# Patient Record
Sex: Female | Born: 1945
Health system: Southern US, Community
[De-identification: ages and names within clinical notes are randomized; demographics above are authoritative.]

## PROBLEM LIST (undated history)

## (undated) DIAGNOSIS — R29898 Other symptoms and signs involving the musculoskeletal system: Secondary | ICD-10-CM

## (undated) DIAGNOSIS — D6481 Anemia due to antineoplastic chemotherapy: Principal | ICD-10-CM

## (undated) DIAGNOSIS — F419 Anxiety disorder, unspecified: Secondary | ICD-10-CM

## (undated) DIAGNOSIS — Z923 Personal history of irradiation: Secondary | ICD-10-CM

## (undated) DIAGNOSIS — R350 Frequency of micturition: Secondary | ICD-10-CM

## (undated) DIAGNOSIS — T451X5A Adverse effect of antineoplastic and immunosuppressive drugs, initial encounter: Principal | ICD-10-CM

## (undated) DIAGNOSIS — L719 Rosacea, unspecified: Secondary | ICD-10-CM

## (undated) DIAGNOSIS — C50919 Malignant neoplasm of unspecified site of unspecified female breast: Secondary | ICD-10-CM

## (undated) DIAGNOSIS — Z9221 Personal history of antineoplastic chemotherapy: Secondary | ICD-10-CM

## (undated) HISTORY — DX: Frequency of micturition: R35.0

## (undated) HISTORY — DX: Personal history of antineoplastic chemotherapy: Z92.21

## (undated) HISTORY — DX: Adverse effect of antineoplastic and immunosuppressive drugs, initial encounter: T45.1X5A

## (undated) HISTORY — DX: Rosacea, unspecified: L71.9

## (undated) HISTORY — DX: Anemia due to antineoplastic chemotherapy: D64.81

## (undated) HISTORY — PX: CHOLECYSTECTOMY: SHX55

## (undated) HISTORY — DX: Personal history of irradiation: Z92.3

---

## 1972-02-09 HISTORY — PX: OTHER SURGICAL HISTORY: SHX169

## 1986-02-08 HISTORY — PX: ABDOMINAL HYSTERECTOMY: SHX81

## 1989-02-08 HISTORY — PX: BREAST EXCISIONAL BIOPSY: SUR124

## 1999-10-24 ENCOUNTER — Emergency Department (HOSPITAL_COMMUNITY): Admission: EM | Admit: 1999-10-24 | Discharge: 1999-10-24 | Payer: Self-pay | Admitting: Emergency Medicine

## 2000-04-03 ENCOUNTER — Emergency Department (HOSPITAL_COMMUNITY): Admission: EM | Admit: 2000-04-03 | Discharge: 2000-04-03 | Payer: Self-pay | Admitting: Emergency Medicine

## 2000-04-03 ENCOUNTER — Encounter: Payer: Self-pay | Admitting: Emergency Medicine

## 2006-12-22 ENCOUNTER — Other Ambulatory Visit: Admission: RE | Admit: 2006-12-22 | Discharge: 2006-12-22 | Payer: Self-pay | Admitting: Obstetrics and Gynecology

## 2007-04-04 ENCOUNTER — Emergency Department (HOSPITAL_COMMUNITY): Admission: EM | Admit: 2007-04-04 | Discharge: 2007-04-04 | Payer: Self-pay | Admitting: Emergency Medicine

## 2010-10-30 LAB — COMPREHENSIVE METABOLIC PANEL
ALT: 137 — ABNORMAL HIGH
AST: 207 — ABNORMAL HIGH
Albumin: 4.1
Alkaline Phosphatase: 140 — ABNORMAL HIGH
BUN: 12
CO2: 29
Calcium: 9.5
Chloride: 101
Creatinine, Ser: 0.64
GFR calc Af Amer: >60
GFR calc non Af Amer: >60
Glucose, Bld: 104 — ABNORMAL HIGH
Potassium: 3.3 — ABNORMAL LOW
Sodium: 137
Total Bilirubin: 1.1
Total Protein: 7.4

## 2010-10-30 LAB — DIFFERENTIAL
Basophils Absolute: 0
Basophils Relative: 0
Eosinophils Absolute: 0
Eosinophils Relative: 0
Lymphocytes Relative: 13
Lymphs Abs: 1.2
Monocytes Absolute: 0.8
Monocytes Relative: 9
Neutro Abs: 7.2
Neutrophils Relative %: 78 — ABNORMAL HIGH

## 2010-10-30 LAB — CBC
HCT: 41.1
Hemoglobin: 14
MCHC: 34
MCV: 91.2
Platelets: 251
RBC: 4.51
RDW: 12.8
WBC: 9.1

## 2010-10-30 LAB — LIPASE, BLOOD: Lipase: 32

## 2010-10-30 LAB — AMYLASE: Amylase: 72

## 2011-05-03 ENCOUNTER — Other Ambulatory Visit: Payer: Self-pay | Admitting: Family Medicine

## 2011-05-03 DIAGNOSIS — N63 Unspecified lump in unspecified breast: Secondary | ICD-10-CM

## 2011-05-04 ENCOUNTER — Other Ambulatory Visit: Payer: Self-pay

## 2011-05-05 ENCOUNTER — Other Ambulatory Visit: Payer: Self-pay | Admitting: Radiology

## 2011-05-05 DIAGNOSIS — C50911 Malignant neoplasm of unspecified site of right female breast: Secondary | ICD-10-CM

## 2011-05-06 ENCOUNTER — Telehealth: Payer: Self-pay | Admitting: *Deleted

## 2011-05-06 ENCOUNTER — Other Ambulatory Visit: Payer: Self-pay | Admitting: *Deleted

## 2011-05-06 DIAGNOSIS — C50411 Malignant neoplasm of upper-outer quadrant of right female breast: Secondary | ICD-10-CM | POA: Insufficient documentation

## 2011-05-06 DIAGNOSIS — C50419 Malignant neoplasm of upper-outer quadrant of unspecified female breast: Secondary | ICD-10-CM

## 2011-05-06 NOTE — Telephone Encounter (Signed)
Confirmed [facility] for 05/12/11 at 0800.  Instructions and contact information given.

## 2011-05-10 ENCOUNTER — Ambulatory Visit
Admission: RE | Admit: 2011-05-10 | Discharge: 2011-05-10 | Disposition: A | Discharge status: Home / Self Care | Payer: Self-pay | Source: Ambulatory Visit | Attending: Radiology | Admitting: Radiology

## 2011-05-10 DIAGNOSIS — C50911 Malignant neoplasm of unspecified site of right female breast: Secondary | ICD-10-CM

## 2011-05-10 MED ORDER — GADOBENATE DIMEGLUMINE 529 MG/ML IV SOLN
13.0000 mL | Freq: Once | INTRAVENOUS | Status: AC | PRN
  Administered 2011-05-10: 13 mL via INTRAVENOUS

## 2011-05-12 ENCOUNTER — Encounter: Payer: Self-pay | Admitting: Oncology

## 2011-05-12 ENCOUNTER — Ambulatory Visit: Payer: Federal, State, Local not specified - PPO | Attending: Surgery | Admitting: Physical Therapy

## 2011-05-12 ENCOUNTER — Telehealth: Payer: Self-pay | Admitting: *Deleted

## 2011-05-12 ENCOUNTER — Ambulatory Visit (HOSPITAL_BASED_OUTPATIENT_CLINIC_OR_DEPARTMENT_OTHER): Payer: Federal, State, Local not specified - PPO | Admitting: Surgery

## 2011-05-12 ENCOUNTER — Ambulatory Visit (HOSPITAL_BASED_OUTPATIENT_CLINIC_OR_DEPARTMENT_OTHER): Payer: Federal, State, Local not specified - PPO | Admitting: Oncology

## 2011-05-12 ENCOUNTER — Telehealth (INDEPENDENT_AMBULATORY_CARE_PROVIDER_SITE_OTHER): Payer: Self-pay | Admitting: Surgery

## 2011-05-12 ENCOUNTER — Ambulatory Visit: Payer: BC Managed Care – PPO

## 2011-05-12 ENCOUNTER — Encounter (INDEPENDENT_AMBULATORY_CARE_PROVIDER_SITE_OTHER): Payer: Self-pay | Admitting: Surgery

## 2011-05-12 ENCOUNTER — Other Ambulatory Visit (HOSPITAL_BASED_OUTPATIENT_CLINIC_OR_DEPARTMENT_OTHER): Payer: Federal, State, Local not specified - PPO | Admitting: Lab

## 2011-05-12 ENCOUNTER — Ambulatory Visit
Admission: RE | Admit: 2011-05-12 | Discharge: 2011-05-12 | Disposition: A | Discharge status: Home / Self Care | Payer: Federal, State, Local not specified - PPO | Source: Ambulatory Visit | Attending: Radiation Oncology | Admitting: Radiation Oncology

## 2011-05-12 ENCOUNTER — Other Ambulatory Visit: Payer: Self-pay | Admitting: Radiology

## 2011-05-12 VITALS — BP 156/89 | HR 89 | Temp 97.5°F | Wt 139.0 lb

## 2011-05-12 VITALS — BP 156/89 | HR 80 | Temp 97.5°F | Ht 60.0 in | Wt 139.0 lb

## 2011-05-12 DIAGNOSIS — C50119 Malignant neoplasm of central portion of unspecified female breast: Secondary | ICD-10-CM

## 2011-05-12 DIAGNOSIS — C50419 Malignant neoplasm of upper-outer quadrant of unspecified female breast: Secondary | ICD-10-CM

## 2011-05-12 DIAGNOSIS — IMO0001 Reserved for inherently not codable concepts without codable children: Secondary | ICD-10-CM | POA: Insufficient documentation

## 2011-05-12 DIAGNOSIS — R928 Other abnormal and inconclusive findings on diagnostic imaging of breast: Secondary | ICD-10-CM

## 2011-05-12 DIAGNOSIS — R293 Abnormal posture: Secondary | ICD-10-CM | POA: Insufficient documentation

## 2011-05-12 DIAGNOSIS — Z170 Estrogen receptor positive status [ER+]: Secondary | ICD-10-CM

## 2011-05-12 DIAGNOSIS — C773 Secondary and unspecified malignant neoplasm of axilla and upper limb lymph nodes: Secondary | ICD-10-CM

## 2011-05-12 LAB — CBC WITH DIFFERENTIAL/PLATELET
BASO%: 0.7 % (ref 0.0–2.0)
EOS%: 0.6 % (ref 0.0–7.0)
Eosinophils Absolute: 0 10*3/uL (ref 0.0–0.5)
LYMPH%: 23.9 % (ref 14.0–49.7)
MCH: 31.4 pg (ref 25.1–34.0)
MCHC: 34.4 g/dL (ref 31.5–36.0)
MCV: 91 fL (ref 79.5–101.0)
MONO%: 6.2 % (ref 0.0–14.0)
Platelets: 257 10*3/uL (ref 145–400)
RBC: 4.43 10*6/uL (ref 3.70–5.45)
RDW: 13.3 % (ref 11.2–14.5)
nRBC: 0 % (ref 0–0)

## 2011-05-12 LAB — COMPREHENSIVE METABOLIC PANEL
ALT: 20 U/L (ref 0–35)
Alkaline Phosphatase: 92 U/L (ref 39–117)
CO2: 29 mEq/L (ref 19–32)
Creatinine, Ser: 0.65 mg/dL (ref 0.50–1.10)
Sodium: 137 mEq/L (ref 135–145)
Total Bilirubin: 0.4 mg/dL (ref 0.3–1.2)

## 2011-05-12 MED ORDER — ALPRAZOLAM 0.25 MG PO TABS: 0.2500 mg | ORAL_TABLET | Freq: Every evening | ORAL | Status: AC | PRN

## 2011-05-12 NOTE — Progress Notes (Signed)
Radiation Oncology         [phone] ________________________________  Initial Outpatient Consultation  Name: Kirsten Gibson MRN: [id number]  Date: 05/12/2011  DOB: [birth year]  WU:[id number],NWGNFA A, MD, MD  Streck, Reola Mosher, MD   REFERRING PHYSICIAN: Currie Paris, MD  DIAGNOSIS: T2/T3 N2 Invasive Ductal Carcinoma of the right breast  HISTORY OF PRESENT ILLNESS::Kirsten Gibson is a 66 y.o. female who is   Referred today for my opinion regarding radiation in the management of her disease. She palpated a right breast mass and presented for a mammogram. This confirmed the presence of a 1.9 cm mass in the upper outer quadrants. And adjacent mass was noted which was possibly thought to represent a intramammary lymph node. A right axillary lymph node was also noted to be enlarged. Biopsy of the upper outer quadrant mass showed a grade 2 invasive ductal carcinoma which was ER/PR positive HER-2 positive. Ki 67 was 12%. An MRI of the bilateral breast was performed which showed an overall area of the right breast measuring 5.0 x 2.6 x 2.2 cm of clumped linear enhancement. Majority of this was felt to represent DCIS. A small area of clumped enhancement was noted in the left breast which was not seen on the mammogram. A biopsy of this area was recommended. A biopsy of the right axillary lymph node was also positive for invasive ductal carcinoma. She is present today with her husband she had no breast related complaints prior to development of this mass.   PREVIOUS RADIATION THERAPY: No  PAST MEDICAL HISTORY:  has a past medical history of Mycosis fungoides.    PAST SURGICAL HISTORY: Past Surgical History  Procedure Date  . Abdominal hysterectomy   . Cholecystectomy   . Benign tumor removed from neck 1974    FAMILY HISTORY: family history includes Cancer in her maternal aunt, paternal grandfather, and paternal grandmother.  SOCIAL HISTORY:  reports that she quit smoking about 25 years ago.  She does not have any smokeless tobacco history on file. She reports that she drinks alcohol.  ALLERGIES: Latex; Diclofenac; and Naproxen  MEDICATIONS:  Current Outpatient Prescriptions  Medication Sig Dispense Refill  . ALPRAZolam (XANAX) 0.25 MG tablet Take 1 tablet (0.25 mg total) by mouth at bedtime as needed for sleep.  30 tablet  0  . calcium gluconate 650 MG tablet Take 650 mg by mouth daily.      . Cholecalciferol (VITAMIN D3) 3000 UNITS TABS Take 1,200 mg by mouth.        REVIEW OF SYSTEMS:  A 15 point review of systems is documented in the electronic medical record. This was obtained by the nursing staff. However, I reviewed this with the patient to discuss relevant findings and make appropriate changes.  Pertinent items are noted in HPI.   PHYSICAL EXAM:  vitals were not taken for this visit.  There were no vitals taken for this visit. General appearance: alert Breasts: Has skin irritation in and around biopsy site of right breast.  Palpable 3 cm area of abnormality. Pendulous breasts bilaterally. No palpable abnormalities of left breast.  Neurologic: Alert and oriented X 3, normal strength and tone. Normal symmetric reflexes. Normal coordination and gait  LABORATORY DATA:  Lab Results  Component Value Date   WBC 7.5 05/12/2011   HGB 13.9 05/12/2011   HCT 40.4 05/12/2011   MCV 91.0 05/12/2011   PLT 257 05/12/2011   Lab Results  Component Value Date   NA 137 05/12/2011  K 3.4* 05/12/2011   CL 101 05/12/2011   CO2 29 05/12/2011   Lab Results  Component Value Date   ALT 20 05/12/2011   AST 22 05/12/2011   ALKPHOS 92 05/12/2011   BILITOT 0.4 05/12/2011     RADIOGRAPHY: Mr Breast Bilateral W Wo Contrast  05/10/2011  *RADIOLOGY REPORT*  Clinical Data: Recently diagnosed invasive ductal carcinoma in the 10 o'clock location of the right breast and right axillary lymph node positive for metastasis.  BUN and creatinine were obtained on site at [facility] at [address] Results:   BUN 13 mg/dL,  Creatinine 0.6 mg/dL.  BILATERAL BREAST MRI WITH AND WITHOUT CONTRAST  Technique: Multiplanar, multisequence MR images of both breasts were obtained prior to and following the intravenous administration of 13ml of Multihance.  Three dimensional images were evaluated at the independent DynaCad workstation.  Comparison:  Mammogram and ultrasound from [facility] 05/03/2011, 05/04/2011 and earlier  Findings: Within the upper outer quadrant of the right breast, there is a large area of non mass, clumped linear enhancement combined with mass-like enhancement.  The entire area measured together is 5.0 x 2.6 x 2.2 cm.  The most discrete, mass-like portion is 1.9 cm in diameter.  Within this mass, a signal void is identified following recent ultrasound guided core biopsy with clip placement.  The enhancement extends anterior to this mass, suspicious for ductal carcinoma in situ in addition to the known invasive ductal carcinoma.  There is skin enhancement along the lower outer quadrant of the right breast.  Enhancement may be related to the patient's mycosis fungoides.  On the left, within the upper central portion of the breast, there is a small area of clumped linear enhancement.  This measures 1.3 x 0.6 x 0.5 cm and is suspicious for ductal carcinoma in situ. Further evaluation with image guided core biopsy is suggested.  Within the right axilla, there are morphologically abnormal lymph nodes with an attenuated fatty hilum.  Nodes are identified posterior to the pectoralis minor, suspicious for metastatic involvement of level II nodes. The patient has known metastatic disease within the right axilla on recent core biopsy.  Within the left axilla, no suspicious lymph nodes are identified.  IMPRESSION:  1.  Discrete mass within the upper-outer quadrant of the right breast, consistent with known malignancy.  By ultrasound, mass measures 1.7 cm maximum diameter.  However, by MRI, there is evidence for  additional malignancy, possible in situ disease within the same quadrant anterior to the known invasive disease. If breast conservation is being considered, image guided biopsy of a second area of disease to determine extent of malignancy is recommended. 2.  Within the upper central aspect of the left breast, clumped nodular enhancement is suspicious for ductal carcinoma in situ. Image guided biopsy is recommended. 3.  Imaging findings consistent with metastatic disease in the right axilla, including presence of level II lymph nodes.  THREE-DIMENSIONAL MR IMAGE RENDERING ON INDEPENDENT WORKSTATION:  Three-dimensional MR images were rendered by post-processing of the original MR data on an independent workstation.  The three- dimensional MR images were interpreted, and findings were reported in the accompanying complete MRI report for this study.  BI-RADS CATEGORY 4:  Suspicious abnormality - biopsy should be considered.  Original Report Authenticated By: Patterson Hammersmith, M.D.      IMPRESSION: T1/T2 N1 invasive ductal carcinoma of the right breast  PLAN:*The patient her husband and I discussed her diagnosis and options for treatment. Discussed the  necessity of biopsy of the anterior aspect of this right breast mass. We also discussed the necessity of biopsy of the diagnosed left breast abnormality. We discussed on the right side she would need radiation whether she elected for breast conservation or mastectomy. The indication for post mastectomy radiation would be the positive right axillary lymph node. We discussed that if this area of enhancement an MRI was in fact invasive disease that perhaps neoadjuvant chemotherapy could be given to shrink this down. If this was DCIS however that would not respond to chemotherapy. We discussed that her response to chemotherapy would not determine her radiation treatment options. She and her husband were very overwhelmed at this time. Briefly discussed the role of  radiation. We discussed the difference between local control versus systemic control. We discussed the process of simulation in the placement of tattoos. We discussed 6 weeks of treatment as an outpatient. I will plan on treating her chest wall and/or breast as well as the supraclavicular lymph nodes. I will plan on seeing her back when she is finished with her chemotherapy and surgery. She has met with Dr. Donnie Coffin, Dr. Jamey Ripa as well as member of our patient family support team. She has also met with one of our physical therapist. I spent 60 minutes minutes face to face with the patient and more than 50% of that time was spent in counseling and/or coordination of care.   ------------------------------------------------

## 2011-05-12 NOTE — Telephone Encounter (Signed)
patient called and to verify she has been scheduled for echo and pet scan on 05-20-2011

## 2011-05-12 NOTE — Progress Notes (Signed)
Referral MD Dr s Margarita Grizzle [facility]   Reason for Referral: Breast cancer   Chief Complaint  Patient presents with  . Breast Cancer  : Is a pleasant 66 year old woman who presents with clinical stage II breast cancer. She has undergone annual screening mammography. She had palpated an abnormality in her right breast at the time of her mammogram. She had a mammogram on 05/03/2011. This showed a hypoechoic mass measuring 17 x 16 x 12 mm , he lymph node detected in the in the ipsilateral axilla was felt to be abnormal. Both these areas were biopsied on 05/04/2011 and both showed invasive ductal cancer. The estrogen . receptor were +82%, progesterone  receptor +92%, proliferative index 12% HER-2 ratio of 2.86 . MRI scan of both breasts performed 05/10/2011 showed an area of non-masslike enhancement combined with masslike enhancement spreading of an area of 5 x 2.6 x 2.8 cm. The left breast there was an area in the central portion measuring 1.3 x 0.6 x 0.5 cm within the right axilla there are morphologically abnormal lymph nodes seen posterior to the pectoralis involved in the level II lymph node.   HPI:   Past Medical History  Diagnosis Date  . Mycosis fungoides  Stage 1A Dx 11/2003   :  Past Surgical History  Procedure Date  . Abdominal hysterectomy  1988, 1 ovary out   . Cholecystectomy   . Benign tumor removed from neck 1974  :  Current outpatient prescriptions:calcium gluconate 650 MG tablet, Take 650 mg by mouth daily., Disp: , Rfl: ;  Cholecalciferol (VITAMIN D3) 3000 UNITS TABS, Take 1,200 mg by mouth., Disp: , Rfl: ;  ALPRAZolam (XANAX) 0.25 MG tablet, Take 1 tablet (0.25 mg total) by mouth at bedtime as needed for sleep., Disp: 30 tablet, Rfl: 0:    :  Allergies  Allergen Reactions  . Latex Dermatitis and Rash  . Diclofenac Other (See Comments)    Fever, chills  . Naproxen Other (See Comments)    Fever, chills  :  Family History  Problem Relation Age of Onset  .  Cancer Maternal Aunt   . Cancer Paternal Grandmother   . Cancer Paternal Grandfather   :  History   Social History  . Marital Status: Married x 21y    Spouse Name: Leonette Most- retired Librarian, academic    Number of Children: 0  . Years of Education: N/A   Occupational History  . Not on file. retired   Social History Main Topics  . Smoking status: Former Smoker -- 1.0 packs/day    Quit date: 02/08/1986  . Smokeless tobacco: Not on file  . Alcohol Use: 0.0 oz/week    0-1 Glasses of wine per week  . Drug Use:   . Sexually Active:    Other Topics Concern  . Not on file   Social History Narrative  . No narrative on file  :  @Reproductive  History@ gop0 Menarche 20 HRT x 4y after TAH Integument/breast: negative except for rash  Exam:  @IPVITALS @ General appearance: alert, cooperative and appears stated age Eyes: conjunctivae/corneas clear. PERRL, EOM's intact. Fundi benign. Throat: lips, mucosa, and tongue normal; teeth and gums normal Resp: clear to auscultation bilaterally and normal percussion bilaterally Breasts: normal appearance, no masses or tenderness, ~3 cm rt breast abnormality, associated with bruising. Cardio: regular rate and rhythm, S1, S2 normal, no murmur, click, rub or gallop GI: soft, non-tender; bowel sounds normal; no masses,  no organomegaly Extremities: extremities normal, atraumatic, no cyanosis  or edema Pulses: 2+ and symmetric Skin: Skin color, texture, turgor normal. No rashes or lesions Lymph nodes: Cervical, supraclavicular, and axillary nodes normal. Neurologic: Grossly normal   Basename 05/12/11 0818  WBC 7.5  HGB 13.9  HCT 40.4  PLT 257    Basename 05/12/11 0818  NA 137  K 3.4*  CL 101  CO2 29  GLUCOSE 143*  BUN 15  CREATININE 0.65  CALCIUM 9.6    Blood smear review: n/a  Pathology:as above  Mr Breast Bilateral W Wo Contrast  05/10/2011  *RADIOLOGY REPORT*  Clinical Data: Recently diagnosed invasive ductal carcinoma in the 10  o'clock location of the right breast and right axillary lymph node positive for metastasis.  BUN and creatinine were obtained on site at [facility] at [address] Results:  BUN 13 mg/dL,  Creatinine 0.6 mg/dL.  BILATERAL BREAST MRI WITH AND WITHOUT CONTRAST  Technique: Multiplanar, multisequence MR images of both breasts were obtained prior to and following the intravenous administration of 13ml of Multihance.  Three dimensional images were evaluated at the independent DynaCad workstation.  Comparison:  Mammogram and ultrasound from [facility] 05/03/2011, 05/04/2011 and earlier  Findings: Within the upper outer quadrant of the right breast, there is a large area of non mass, clumped linear enhancement combined with mass-like enhancement.  The entire area measured together is 5.0 x 2.6 x 2.2 cm.  The most discrete, mass-like portion is 1.9 cm in diameter.  Within this mass, a signal void is identified following recent ultrasound guided core biopsy with clip placement.  The enhancement extends anterior to this mass, suspicious for ductal carcinoma in situ in addition to the known invasive ductal carcinoma.  There is skin enhancement along the lower outer quadrant of the right breast.  Enhancement may be related to the patient's mycosis fungoides.  On the left, within the upper central portion of the breast, there is a small area of clumped linear enhancement.  This measures 1.3 x 0.6 x 0.5 cm and is suspicious for ductal carcinoma in situ. Further evaluation with image guided core biopsy is suggested.  Within the right axilla, there are morphologically abnormal lymph nodes with an attenuated fatty hilum.  Nodes are identified posterior to the pectoralis minor, suspicious for metastatic involvement of level II nodes. The patient has known metastatic disease within the right axilla on recent core biopsy.  Within the left axilla, no suspicious lymph nodes are identified.  IMPRESSION:  1.   Discrete mass within the upper-outer quadrant of the right breast, consistent with known malignancy.  By ultrasound, mass measures 1.7 cm maximum diameter.  However, by MRI, there is evidence for additional malignancy, possible in situ disease within the same quadrant anterior to the known invasive disease. If breast conservation is being considered, image guided biopsy of a second area of disease to determine extent of malignancy is recommended. 2.  Within the upper central aspect of the left breast, clumped nodular enhancement is suspicious for ductal carcinoma in situ. Image guided biopsy is recommended. 3.  Imaging findings consistent with metastatic disease in the right axilla, including presence of level II lymph nodes.  THREE-DIMENSIONAL MR IMAGE RENDERING ON INDEPENDENT WORKSTATION:  Three-dimensional MR images were rendered by post-processing of the original MR data on an independent workstation.  The three- dimensional MR images were interpreted, and findings were reported in the accompanying complete MRI report for this study.  BI-RADS CATEGORY 4:  Suspicious abnormality - biopsy should be considered.  Original Report Authenticated By: Patterson Hammersmith, M.D.    Assessment and Plan: This a 66 year old woman who presents with ER/PR positive HER-2 positive breast cancer in a sizable mass noted on MRI scan. There is associated positive involved lymph node and possibly additional lymph nodes. We spent about 45 minutes today discussing the overall treatment plan. We did discuss the necessity to undergo additional biopsies of the right breast abnormality to determine the greatest dimension and whether or not DCIS was involved with the periphery of the lesion. In addition the left lesion had to be biopsied as well. I introduced the concept of neoadjuvant therapy in an attempt to down stage her cancer. She has some understanding of this. She in principle has agreed to go ahead with this. We will go ahead  with staging PET scan, 2-D echo, port placement as well as chemotherapy teaching. I will plan to see the patient back after she has had these various procedures performed. I did question the HER-2 was alternately may be inclined to pursue an alternate test to determine whether this can be confirmed. I did discuss his chemotherapy side effects as well as Herceptin. In addition we reviewed side effects of hormonal therapy. Mycoses fungoides diagnosis was made many years ago and she appears to be in good remission from this with no evidence of recurrence. Approximately 45 minutes was spent with this patient, at that time and patient-related counseling.  Pierce Crane M.D. FRCP C.

## 2011-05-12 NOTE — Telephone Encounter (Signed)
Order placed while at [facility], [facility] will call patient with appt.

## 2011-05-12 NOTE — Progress Notes (Signed)
Patient ID: Kirsten Gibson, female   DOB: 12/23/1945, 66 y.o.   MRN: [id number]  Chief Complaint  Patient presents with  . Breast Cancer    right    HPI Kirsten Gibson is a 66 y.o. female.  She is referred from her primary physician for a newly diagnosed right breast cancer. She felt an area in the right breast upper-outer quadrant that felt abnormal to her. She had a mammogram done which shows a mass and a biopsy has shown invasive ductal carcinoma, receptor-positive, HER-2 positive. An MRI shows a more extensive area with some stranding worrisome for DCIS. The MRI also shows a possible lesion on the left breast has not yet been biopsied. She's not had any other significant breast problems. HPI  Past Medical History  Diagnosis Date  . Mycosis fungoides     Past Surgical History  Procedure Date  . Abdominal hysterectomy   . Cholecystectomy   . Benign tumor removed from neck 1974    Family History  Problem Relation Age of Onset  . Cancer Maternal Aunt   . Cancer Paternal Grandmother   . Cancer Paternal Grandfather     Social History History  Substance Use Topics  . Smoking status: Former Smoker -- 1.0 packs/day    Quit date: 02/08/1986  . Smokeless tobacco: Not on file  . Alcohol Use: 0.0 oz/week    0-1 Glasses of wine per week    Allergies  Allergen Reactions  . Latex Dermatitis and Rash  . Diclofenac Other (See Comments)    Fever, chills  . Naproxen Other (See Comments)    Fever, chills    Current Outpatient Prescriptions  Medication Sig Dispense Refill  . ALPRAZolam (XANAX) 0.25 MG tablet Take 1 tablet (0.25 mg total) by mouth at bedtime as needed for sleep.  30 tablet  0  . calcium gluconate 650 MG tablet Take 650 mg by mouth daily.      . Cholecalciferol (VITAMIN D3) 3000 UNITS TABS Take 1,200 mg by mouth.        Review of Systems Review of Systems  Constitutional: Negative for fever, chills and unexpected weight change.  HENT: Negative for hearing loss,  congestion, sore throat, trouble swallowing and voice change.   Eyes: Negative for visual disturbance.  Respiratory: Negative for cough and wheezing.   Cardiovascular: Negative for chest pain, palpitations and leg swelling.  Gastrointestinal: Negative for nausea, vomiting, abdominal pain, diarrhea, constipation, blood in stool, abdominal distention and anal bleeding.  Genitourinary: Negative for hematuria, vaginal bleeding and difficulty urinating.  Musculoskeletal: Negative for arthralgias.  Skin: Negative for rash and wound.  Neurological: Negative for seizures, syncope and headaches.  Hematological: Negative for adenopathy. Does not bruise/bleed easily.  Psychiatric/Behavioral: Negative for confusion.    Blood pressure 156/89, pulse 89, temperature 97.5 F (36.4 C), weight 139 lb (63.05 kg).  Physical Exam  Wt Readings from Last 3 Encounters:  05/12/11 139 lb (63.05 kg)  05/12/11 139 lb (63.05 kg)   Temp Readings from Last 3 Encounters:  05/12/11 97.5 F (36.4 C)   05/12/11 97.5 F (36.4 C) Oral   BP Readings from Last 3 Encounters:  05/12/11 156/89  05/12/11 156/89   Pulse Readings from Last 3 Encounters:  05/12/11 89  05/12/11 80    Physical Exam  Vitals reviewed. Constitutional: She is oriented to person, place, and time. She appears well-developed and well-nourished. No distress.  HENT:  Head: Normocephalic and atraumatic.  Mouth/Throat: Oropharynx is clear and moist.    Eyes: Conjunctivae and EOM are normal. Pupils are equal, round, and reactive to light. No scleral icterus.  Neck: Normal range of motion. Neck supple. No tracheal deviation present. No thyromegaly present.  Cardiovascular: Normal rate, regular rhythm, normal heart sounds and intact distal pulses.  Exam reveals no gallop and no friction rub.   No murmur heard. Pulmonary/Chest: Effort normal and breath sounds normal. No respiratory distress. She has no wheezes. She has no rales.         3 cm hard  irregular area c/w cancer  Abdominal: Soft. Bowel sounds are normal. She exhibits no distension and no mass. There is no tenderness. There is no rebound and no guarding.  Musculoskeletal: Normal range of motion. She exhibits no edema and no tenderness.  Neurological: She is alert and oriented to person, place, and time.  Skin: Skin is warm and dry. No rash noted. She is not diaphoretic. No erythema.  Psychiatric: She has a normal mood and affect. Her behavior is normal. Judgment and thought content normal.    Data Reviewed I have reviewed the mammogram and ultrasound films and reports in the MRI films and report with the radiologist. I reviewed the pathology slides and reports for the pathologist. Findings are as noted in the history of present illness.An axillary node was also positive on biopsy.  Assessment    Clinical stage IIB right breast cancer, invasive ductal, upper outer quadrant.    Plan    I had a lengthy discussion with the patient about her diagnosis and options. After this lengthy discussion she is favoring starting neoadjuvant chemotherapy. We did discuss port placement risks and complications. She knows that she needs a second biopsy on the right side to assess the size of the cancer and even with neoadjuvant chemotherapy she may still need a mastectomy. She will need radiation therapy apparently even with a mastectomy so would need to have a deferred reconstruction which we discussed. She also needs a left-sided biopsy to see if she has cancer on that side as well and this could change some of her long-term plans. I have explained the pathophysiology and staging of breast cancer with particular attention to her exact situation. We discussed the multidisciplinary approach to breast cancer which often includes both medical and radiation oncology consultations.  We also discussed surgical options for the treatment of breast cancer including lumpectomy and mastectomy with possible  reconstructive surgery. In addition we talked about the evaluation and management of lymph nodes including a description of sentinel lymph node biopsy and axillary dissections. We reviewed potential complications and risks including bleeding, infection, numbness,  lymphedema, and the potential need for additional surgery.  She understands that for patients who are candidate for lumpectomy or mastectomy there is an equal survival rate with either technique, but a slightly higher local recurrence rate with lumpectomy. In addition she knows that a lumpectomy usually requires postoperative radiation as part of the management of the breast cancer.  We have discussed the likely postoperative course and plans for followup.  I have given the patient some written information that reviewed all of these issues. I believe her questions are answered and that she has a good understanding of the issues.        Avanna Sowder J 05/12/2011, 12:48 PM  CC:WOLTERS,SHARON A, MD, MD   

## 2011-05-12 NOTE — Patient Instructions (Signed)
We will arrange a portacath placement to start chemotherapy

## 2011-05-13 ENCOUNTER — Other Ambulatory Visit: Payer: Self-pay | Admitting: *Deleted

## 2011-05-13 ENCOUNTER — Encounter (HOSPITAL_COMMUNITY): Payer: Self-pay | Admitting: *Deleted

## 2011-05-13 ENCOUNTER — Encounter: Payer: Self-pay | Admitting: *Deleted

## 2011-05-13 NOTE — Discharge Instructions (Signed)
20 Kirsten Gibson  05/13/2011   Your procedure is scheduled on:  05/21/11  Report to SHORT STAY DEPT  at 5:15 AM.  Call this number if you have problems the morning of surgery: [phone]   Remember:   Do not eat food or drink liquids AFTER MIDNIGHT    Take these medicines the morning of surgery with A SIP OF WATER: none   Do not wear jewelry, make-up or nail polish.  Do not wear lotions, powders, or perfumes.   Do not shave legs or underarms 48 hrs. before surgery (men may shave face)  Do not bring valuables to the hospital.  Contacts, dentures or bridgework may not be worn into surgery.  Leave suitcase in the car. After surgery it may be brought to your room.  For patients admitted to the hospital, checkout time is 11:00 AM the day of discharge.   Patients discharged the day of surgery will not be allowed to drive home.  Name and phone number of your driver:   Special Instructions:   Please read over the following fact sheets that you were given: MRSA  Information               SHOWER WITH BETASEPT THE NIGHT BEFORE SURGERY AND THE MORNING OF SURGERY

## 2011-05-13 NOTE — Progress Notes (Signed)
Mailed after appt letter to pt. 

## 2011-05-14 ENCOUNTER — Encounter (HOSPITAL_COMMUNITY): Payer: Self-pay | Admitting: Pharmacy Technician

## 2011-05-17 ENCOUNTER — Telehealth: Payer: Self-pay | Admitting: *Deleted

## 2011-05-17 ENCOUNTER — Telehealth: Payer: Self-pay | Admitting: Oncology

## 2011-05-17 NOTE — Telephone Encounter (Signed)
S/w the pt and she is aware of her April 15ht appt

## 2011-05-17 NOTE — Telephone Encounter (Signed)
Spoke to pt concerning [facility] from 4/3.  Confirmed MRI guided bx appt, surgery date, and appts for CT/PET, echo, chemo class and f/u with Dr. Donnie Coffin.  Pt request for reach to recovery volunteer.  Informed her I would send in the referral.  Pt also had questions pertaining to advanced directives.  Informed pt that I would have our social worker contact her.  Discussed dx and treatment care plan.  Received verbal understanding.  Encourage pt to call with needs. Contact information given.

## 2011-05-17 NOTE — Telephone Encounter (Signed)
Left vm for pt to return call concerning [facility] from 4/3.

## 2011-05-18 ENCOUNTER — Encounter: Payer: Self-pay | Admitting: *Deleted

## 2011-05-18 ENCOUNTER — Other Ambulatory Visit: Payer: Federal, State, Local not specified - PPO

## 2011-05-18 ENCOUNTER — Encounter: Payer: Self-pay | Admitting: Specialist

## 2011-05-18 NOTE — Progress Notes (Signed)
I spoke with patient and her husband at the chemotherapy orientation today.  She was tearful during the session.  I encouraged her to take advantage of the available support services, about which I gave her information.

## 2011-05-19 ENCOUNTER — Ambulatory Visit
Admission: RE | Admit: 2011-05-19 | Discharge: 2011-05-19 | Disposition: A | Discharge status: Home / Self Care | Payer: Federal, State, Local not specified - PPO | Source: Ambulatory Visit | Attending: Radiology | Admitting: Radiology

## 2011-05-19 ENCOUNTER — Other Ambulatory Visit: Payer: Self-pay | Admitting: Radiology

## 2011-05-19 DIAGNOSIS — C50411 Malignant neoplasm of upper-outer quadrant of right female breast: Secondary | ICD-10-CM | POA: Insufficient documentation

## 2011-05-19 DIAGNOSIS — Z170 Estrogen receptor positive status [ER+]: Secondary | ICD-10-CM

## 2011-05-19 DIAGNOSIS — R928 Other abnormal and inconclusive findings on diagnostic imaging of breast: Secondary | ICD-10-CM

## 2011-05-19 HISTORY — PX: BREAST BIOPSY: SHX20

## 2011-05-19 MED ORDER — GADOBENATE DIMEGLUMINE 529 MG/ML IV SOLN
13.0000 mL | Freq: Once | INTRAVENOUS | Status: AC | PRN
  Administered 2011-05-19: 13 mL via INTRAVENOUS

## 2011-05-19 NOTE — Progress Notes (Signed)
05-19-11 1230 Pt. Reinstructed/review of preop instructions prior to arriving as SDS labs, etc.Voiced understanding of all instructions as before on 05-13-11 per D. Renato Gails, Charity fundraiser. W. Kennon Portela

## 2011-05-20 ENCOUNTER — Ambulatory Visit
Admission: RE | Admit: 2011-05-20 | Discharge: 2011-05-20 | Disposition: A | Discharge status: Home / Self Care | Payer: Federal, State, Local not specified - PPO | Source: Ambulatory Visit | Attending: Radiology | Admitting: Radiology

## 2011-05-20 ENCOUNTER — Encounter (HOSPITAL_COMMUNITY)
Admission: RE | Admit: 2011-05-20 | Discharge: 2011-05-20 | Disposition: A | Discharge status: Home / Self Care | Payer: Federal, State, Local not specified - PPO | Source: Ambulatory Visit | Attending: Oncology | Admitting: Oncology

## 2011-05-20 ENCOUNTER — Ambulatory Visit (HOSPITAL_COMMUNITY)
Admission: RE | Admit: 2011-05-20 | Discharge: 2011-05-20 | Disposition: A | Discharge status: Home / Self Care | Payer: Federal, State, Local not specified - PPO | Source: Ambulatory Visit | Attending: Oncology | Admitting: Oncology

## 2011-05-20 DIAGNOSIS — C773 Secondary and unspecified malignant neoplasm of axilla and upper limb lymph nodes: Secondary | ICD-10-CM | POA: Insufficient documentation

## 2011-05-20 DIAGNOSIS — M949 Disorder of cartilage, unspecified: Secondary | ICD-10-CM | POA: Insufficient documentation

## 2011-05-20 DIAGNOSIS — N281 Cyst of kidney, acquired: Secondary | ICD-10-CM | POA: Insufficient documentation

## 2011-05-20 DIAGNOSIS — Z90710 Acquired absence of both cervix and uterus: Secondary | ICD-10-CM | POA: Insufficient documentation

## 2011-05-20 DIAGNOSIS — C50419 Malignant neoplasm of upper-outer quadrant of unspecified female breast: Secondary | ICD-10-CM | POA: Insufficient documentation

## 2011-05-20 DIAGNOSIS — M899 Disorder of bone, unspecified: Secondary | ICD-10-CM | POA: Insufficient documentation

## 2011-05-20 DIAGNOSIS — N200 Calculus of kidney: Secondary | ICD-10-CM | POA: Insufficient documentation

## 2011-05-20 DIAGNOSIS — R928 Other abnormal and inconclusive findings on diagnostic imaging of breast: Secondary | ICD-10-CM

## 2011-05-20 DIAGNOSIS — C50919 Malignant neoplasm of unspecified site of unspecified female breast: Secondary | ICD-10-CM | POA: Insufficient documentation

## 2011-05-20 LAB — GLUCOSE, CAPILLARY: Glucose-Capillary: 93 mg/dL (ref 70–99)

## 2011-05-20 MED ORDER — FLUDEOXYGLUCOSE F - 18 (FDG) INJECTION
16.1000 | Freq: Once | INTRAVENOUS | Status: AC | PRN
  Administered 2011-05-20: 16.1 via INTRAVENOUS

## 2011-05-20 NOTE — Progress Notes (Signed)
  Echocardiogram 2D Echocardiogram has been performed.  Cathie Beams Deneen 05/20/2011, 10:14 AM

## 2011-05-21 ENCOUNTER — Ambulatory Visit (HOSPITAL_COMMUNITY): Payer: Federal, State, Local not specified - PPO

## 2011-05-21 ENCOUNTER — Encounter (HOSPITAL_COMMUNITY): Payer: Self-pay | Admitting: Anesthesiology

## 2011-05-21 ENCOUNTER — Encounter (HOSPITAL_COMMUNITY): Payer: Self-pay

## 2011-05-21 ENCOUNTER — Ambulatory Visit (HOSPITAL_COMMUNITY)
Admission: RE | Admit: 2011-05-21 | Discharge: 2011-05-21 | Disposition: A | Discharge status: Home / Self Care | Payer: Federal, State, Local not specified - PPO | Source: Ambulatory Visit | Attending: Surgery | Admitting: Surgery

## 2011-05-21 ENCOUNTER — Encounter (HOSPITAL_COMMUNITY)
Admission: RE | Disposition: A | Discharge status: Home / Self Care | Payer: Self-pay | Source: Ambulatory Visit | Attending: Surgery

## 2011-05-21 ENCOUNTER — Ambulatory Visit (HOSPITAL_COMMUNITY): Payer: Federal, State, Local not specified - PPO | Admitting: Anesthesiology

## 2011-05-21 DIAGNOSIS — Z79899 Other long term (current) drug therapy: Secondary | ICD-10-CM | POA: Insufficient documentation

## 2011-05-21 DIAGNOSIS — Z90710 Acquired absence of both cervix and uterus: Secondary | ICD-10-CM | POA: Insufficient documentation

## 2011-05-21 DIAGNOSIS — C50919 Malignant neoplasm of unspecified site of unspecified female breast: Secondary | ICD-10-CM

## 2011-05-21 DIAGNOSIS — C50419 Malignant neoplasm of upper-outer quadrant of unspecified female breast: Secondary | ICD-10-CM | POA: Insufficient documentation

## 2011-05-21 HISTORY — PX: PORTACATH PLACEMENT: SHX2246

## 2011-05-21 HISTORY — DX: Anxiety disorder, unspecified: F41.9

## 2011-05-21 HISTORY — DX: Malignant neoplasm of unspecified site of unspecified female breast: C50.919

## 2011-05-21 HISTORY — DX: Other symptoms and signs involving the musculoskeletal system: R29.898

## 2011-05-21 LAB — SURGICAL PCR SCREEN
MRSA, PCR: NEGATIVE
Staphylococcus aureus: POSITIVE — AB

## 2011-05-21 SURGERY — INSERTION, TUNNELED CENTRAL VENOUS DEVICE, WITH PORT
Anesthesia: General | Site: Chest | Laterality: Left | Wound class: Clean

## 2011-05-21 MED ORDER — HYDROCODONE-ACETAMINOPHEN 5-325 MG PO TABS
1.0000 | ORAL_TABLET | Freq: Once | ORAL | Status: AC
  Administered 2011-05-21: 1 via ORAL

## 2011-05-21 MED ORDER — LIDOCAINE-EPINEPHRINE 1 %-1:100000 IJ SOLN
INTRAMUSCULAR | Status: DC | PRN
  Administered 2011-05-21: 7 mL

## 2011-05-21 MED ORDER — IOHEXOL 300 MG/ML  SOLN
INTRAMUSCULAR | Status: AC
  Filled 2011-05-21: qty 1

## 2011-05-21 MED ORDER — CEFAZOLIN SODIUM 1-5 GM-% IV SOLN
INTRAVENOUS | Status: AC
  Filled 2011-05-21: qty 50

## 2011-05-21 MED ORDER — MIDAZOLAM HCL 5 MG/5ML IJ SOLN
INTRAMUSCULAR | Status: DC | PRN
  Administered 2011-05-21 (×2): 1 mg via INTRAVENOUS

## 2011-05-21 MED ORDER — CEFAZOLIN SODIUM 1-5 GM-% IV SOLN: 1.0000 g | INTRAVENOUS | Status: DC

## 2011-05-21 MED ORDER — DEXAMETHASONE SODIUM PHOSPHATE 10 MG/ML IJ SOLN
INTRAMUSCULAR | Status: DC | PRN
  Administered 2011-05-21: 10 mg via INTRAVENOUS

## 2011-05-21 MED ORDER — CHLORHEXIDINE GLUCONATE 4 % EX LIQD: 1.0000 "application " | Freq: Once | CUTANEOUS | Status: DC

## 2011-05-21 MED ORDER — HYDROCODONE-ACETAMINOPHEN 5-325 MG PO TABS
ORAL_TABLET | ORAL | Status: AC
  Filled 2011-05-21: qty 1

## 2011-05-21 MED ORDER — SODIUM CHLORIDE 0.9 % IR SOLN
Freq: Once | Status: DC
  Filled 2011-05-21: qty 1.2

## 2011-05-21 MED ORDER — CEFAZOLIN SODIUM 1-5 GM-% IV SOLN
INTRAVENOUS | Status: DC | PRN
  Administered 2011-05-21: 1 g via INTRAVENOUS

## 2011-05-21 MED ORDER — LIDOCAINE-EPINEPHRINE 1 %-1:100000 IJ SOLN
INTRAMUSCULAR | Status: AC
  Filled 2011-05-21: qty 1

## 2011-05-21 MED ORDER — ACETAMINOPHEN 10 MG/ML IV SOLN
INTRAVENOUS | Status: DC | PRN
  Administered 2011-05-21: 1000 mg via INTRAVENOUS

## 2011-05-21 MED ORDER — HEPARIN SOD (PORK) LOCK FLUSH 100 UNIT/ML IV SOLN
INTRAVENOUS | Status: DC | PRN
  Administered 2011-05-21: 500 [IU] via INTRAVENOUS

## 2011-05-21 MED ORDER — FENTANYL CITRATE 0.05 MG/ML IJ SOLN
INTRAMUSCULAR | Status: DC | PRN
  Administered 2011-05-21: 100 ug via INTRAVENOUS
  Administered 2011-05-21: 50 ug via INTRAVENOUS

## 2011-05-21 MED ORDER — LIDOCAINE HCL (CARDIAC) 20 MG/ML IV SOLN
INTRAVENOUS | Status: DC | PRN
  Administered 2011-05-21: 75 mg via INTRAVENOUS

## 2011-05-21 MED ORDER — HEPARIN SOD (PORK) LOCK FLUSH 100 UNIT/ML IV SOLN
INTRAVENOUS | Status: AC
  Filled 2011-05-21: qty 5

## 2011-05-21 MED ORDER — FENTANYL CITRATE 0.05 MG/ML IJ SOLN: 25.0000 ug | INTRAMUSCULAR | Status: DC | PRN

## 2011-05-21 MED ORDER — LACTATED RINGERS IV SOLN
INTRAVENOUS | Status: DC | PRN
  Administered 2011-05-21 (×2): via INTRAVENOUS

## 2011-05-21 MED ORDER — ONDANSETRON HCL 4 MG/2ML IJ SOLN
INTRAMUSCULAR | Status: DC | PRN
  Administered 2011-05-21 (×2): 2 mg via INTRAVENOUS

## 2011-05-21 MED ORDER — HYDROCODONE-ACETAMINOPHEN 5-325 MG PO TABS: 1.0000 | ORAL_TABLET | ORAL | Status: AC | PRN

## 2011-05-21 MED ORDER — LACTATED RINGERS IV SOLN: INTRAVENOUS | Status: DC

## 2011-05-21 MED ORDER — HYDROCODONE-ACETAMINOPHEN 5-325 MG PO TABS: 1.0000 | ORAL_TABLET | ORAL | Status: DC | PRN

## 2011-05-21 MED ORDER — MUPIROCIN 2 % EX OINT
TOPICAL_OINTMENT | CUTANEOUS | Status: AC
  Filled 2011-05-21: qty 22

## 2011-05-21 MED ORDER — PROPOFOL 10 MG/ML IV EMUL
INTRAVENOUS | Status: DC | PRN
  Administered 2011-05-21: 25 mg via INTRAVENOUS
  Administered 2011-05-21: 175 mg via INTRAVENOUS

## 2011-05-21 MED ORDER — IOHEXOL 300 MG/ML  SOLN
INTRAMUSCULAR | Status: DC | PRN
  Administered 2011-05-21: 7 mL via INTRAVENOUS

## 2011-05-21 MED ORDER — SODIUM CHLORIDE 0.9 % IR SOLN
Status: DC | PRN
  Administered 2011-05-21: 08:00:00

## 2011-05-21 MED ORDER — PROMETHAZINE HCL 25 MG/ML IJ SOLN: 6.2500 mg | INTRAMUSCULAR | Status: DC | PRN

## 2011-05-21 MED ORDER — ACETAMINOPHEN 10 MG/ML IV SOLN
INTRAVENOUS | Status: AC
  Filled 2011-05-21: qty 100

## 2011-05-21 SURGICAL SUPPLY — 50 items
ADH SKN CLS APL DERMABOND .7 (GAUZE/BANDAGES/DRESSINGS) ×1
APL SKNCLS STERI-STRIP NONHPOA (GAUZE/BANDAGES/DRESSINGS) ×1
BAG DECANTER FOR FLEXI CONT (MISCELLANEOUS) ×2 IMPLANT
BENZOIN TINCTURE PRP APPL 2/3 (GAUZE/BANDAGES/DRESSINGS) ×1 IMPLANT
BLADE HEX COATED 2.75 (ELECTRODE) ×2 IMPLANT
BLADE SURG 15 STRL LF DISP TIS (BLADE) ×1 IMPLANT
BLADE SURG 15 STRL SS (BLADE) ×2
CANISTER SUCTION 2500CC (MISCELLANEOUS) ×2 IMPLANT
CHLORAPREP W/TINT 26ML (MISCELLANEOUS) ×2 IMPLANT
CLOTH BEACON ORANGE TIMEOUT ST (SAFETY) ×2 IMPLANT
COVER PROBE U/S 5X48 (MISCELLANEOUS) ×1 IMPLANT
COVER SURGICAL LIGHT HANDLE (MISCELLANEOUS) ×2 IMPLANT
DECANTER SPIKE VIAL GLASS SM (MISCELLANEOUS) ×2 IMPLANT
DERMABOND ADVANCED (GAUZE/BANDAGES/DRESSINGS) ×1
DERMABOND ADVANCED .7 DNX12 (GAUZE/BANDAGES/DRESSINGS) IMPLANT
DRAPE C-ARM 42X72 X-RAY (DRAPES) ×2 IMPLANT
DRAPE LAPAROTOMY TRNSV 102X78 (DRAPE) ×2 IMPLANT
ELECT REM PT RETURN 9FT ADLT (ELECTROSURGICAL) ×2
ELECTRODE REM PT RTRN 9FT ADLT (ELECTROSURGICAL) ×1 IMPLANT
GAUZE SPONGE 4X4 16PLY XRAY LF (GAUZE/BANDAGES/DRESSINGS) ×3 IMPLANT
GLOVE BIOGEL PI IND STRL 6.5 (GLOVE) IMPLANT
GLOVE BIOGEL PI IND STRL 7.0 (GLOVE) ×1 IMPLANT
GLOVE BIOGEL PI IND STRL 7.5 (GLOVE) IMPLANT
GLOVE BIOGEL PI INDICATOR 6.5 (GLOVE) ×1
GLOVE BIOGEL PI INDICATOR 7.0 (GLOVE) ×2
GLOVE BIOGEL PI INDICATOR 7.5 (GLOVE) ×1
GLOVE EUDERMIC 7 POWDERFREE (GLOVE) ×1 IMPLANT
GOWN PREVENTION PLUS XLARGE (GOWN DISPOSABLE) ×1 IMPLANT
GOWN STRL NON-REIN LRG LVL3 (GOWN DISPOSABLE) ×3 IMPLANT
GOWN STRL REIN XL XLG (GOWN DISPOSABLE) ×5 IMPLANT
KIT BASIN OR (CUSTOM PROCEDURE TRAY) ×2 IMPLANT
KIT PORT POWER ISP 8FR (Catheter) IMPLANT
KIT POWER CATH 8FR (Catheter) IMPLANT
KIT POWER PORT SLIM 6FR (PORTABLE EQUIPMENT SUPPLIES) ×3 IMPLANT
NDL HYPO 25X1 1.5 SAFETY (NEEDLE) ×1 IMPLANT
NEEDLE HYPO 25X1 1.5 SAFETY (NEEDLE) ×2 IMPLANT
NS IRRIG 1000ML POUR BTL (IV SOLUTION) ×2 IMPLANT
PACK BASIC VI WITH GOWN DISP (CUSTOM PROCEDURE TRAY) ×2 IMPLANT
PENCIL BUTTON HOLSTER BLD 10FT (ELECTRODE) ×2 IMPLANT
SPONGE GAUZE 4X4 12PLY (GAUZE/BANDAGES/DRESSINGS) ×1 IMPLANT
STRIP CLOSURE SKIN 1/2X4 (GAUZE/BANDAGES/DRESSINGS) ×1 IMPLANT
SUT MNCRL AB 4-0 PS2 18 (SUTURE) ×2 IMPLANT
SUT PROLENE 2 0 SH DA (SUTURE) ×2 IMPLANT
SUT SILK 2 0 (SUTURE)
SUT SILK 2-0 18XBRD TIE 12 (SUTURE) IMPLANT
SUT VIC AB 3-0 SH 18 (SUTURE) ×2 IMPLANT
SYR CONTROL 10ML LL (SYRINGE) ×3 IMPLANT
SYRINGE 10CC LL (SYRINGE) ×3 IMPLANT
TOWEL OR 17X26 10 PK STRL BLUE (TOWEL DISPOSABLE) ×3 IMPLANT
YANKAUER SUCT BULB TIP 10FT TU (MISCELLANEOUS) ×1 IMPLANT

## 2011-05-21 NOTE — H&P (View-Only) (Signed)
Patient ID: Kirsten Gibson, female   DOB: [birth year], 66 y.o.   MRN: [id number]  Chief Complaint  Patient presents with  . Breast Cancer    right    HPI Kirsten Gibson is a 66 y.o. female.  She is referred from her primary physician for a newly diagnosed right breast cancer. She felt an area in the right breast upper-outer quadrant that felt abnormal to her. She had a mammogram done which shows a mass and a biopsy has shown invasive ductal carcinoma, receptor-positive, HER-2 positive. An MRI shows a more extensive area with some stranding worrisome for DCIS. The MRI also shows a possible lesion on the left breast has not yet been biopsied. She's not had any other significant breast problems. HPI  Past Medical History  Diagnosis Date  . Mycosis fungoides     Past Surgical History  Procedure Date  . Abdominal hysterectomy   . Cholecystectomy   . Benign tumor removed from neck 1974    Family History  Problem Relation Age of Onset  . Cancer Maternal Aunt   . Cancer Paternal Grandmother   . Cancer Paternal Grandfather     Social History History  Substance Use Topics  . Smoking status: Former Smoker -- 1.0 packs/day    Quit date: 02/08/1986  . Smokeless tobacco: Not on file  . Alcohol Use: 0.0 oz/week    0-1 Glasses of wine per week    Allergies  Allergen Reactions  . Latex Dermatitis and Rash  . Diclofenac Other (See Comments)    Fever, chills  . Naproxen Other (See Comments)    Fever, chills    Current Outpatient Prescriptions  Medication Sig Dispense Refill  . ALPRAZolam (XANAX) 0.25 MG tablet Take 1 tablet (0.25 mg total) by mouth at bedtime as needed for sleep.  30 tablet  0  . calcium gluconate 650 MG tablet Take 650 mg by mouth daily.      . Cholecalciferol (VITAMIN D3) 3000 UNITS TABS Take 1,200 mg by mouth.        Review of Systems Review of Systems  Constitutional: Negative for fever, chills and unexpected weight change.  HENT: Negative for hearing loss,  congestion, sore throat, trouble swallowing and voice change.   Eyes: Negative for visual disturbance.  Respiratory: Negative for cough and wheezing.   Cardiovascular: Negative for chest pain, palpitations and leg swelling.  Gastrointestinal: Negative for nausea, vomiting, abdominal pain, diarrhea, constipation, blood in stool, abdominal distention and anal bleeding.  Genitourinary: Negative for hematuria, vaginal bleeding and difficulty urinating.  Musculoskeletal: Negative for arthralgias.  Skin: Negative for rash and wound.  Neurological: Negative for seizures, syncope and headaches.  Hematological: Negative for adenopathy. Does not bruise/bleed easily.  Psychiatric/Behavioral: Negative for confusion.    Blood pressure 156/89, pulse 89, temperature 97.5 F (36.4 C), weight 139 lb (63.05 kg).  Physical Exam  Wt Readings from Last 3 Encounters:  05/12/11 139 lb (63.05 kg)  05/12/11 139 lb (63.05 kg)   Temp Readings from Last 3 Encounters:  05/12/11 97.5 F (36.4 C)   05/12/11 97.5 F (36.4 C) Oral   BP Readings from Last 3 Encounters:  05/12/11 156/89  05/12/11 156/89   Pulse Readings from Last 3 Encounters:  05/12/11 89  05/12/11 80    Physical Exam  Vitals reviewed. Constitutional: She is oriented to person, place, and time. She appears well-developed and well-nourished. No distress.  HENT:  Head: Normocephalic and atraumatic.  Mouth/Throat: Oropharynx is clear and moist.  Eyes: Conjunctivae and EOM are normal. Pupils are equal, round, and reactive to light. No scleral icterus.  Neck: Normal range of motion. Neck supple. No tracheal deviation present. No thyromegaly present.  Cardiovascular: Normal rate, regular rhythm, normal heart sounds and intact distal pulses.  Exam reveals no gallop and no friction rub.   No murmur heard. Pulmonary/Chest: Effort normal and breath sounds normal. No respiratory distress. She has no wheezes. She has no rales.         3 cm hard  irregular area c/w cancer  Abdominal: Soft. Bowel sounds are normal. She exhibits no distension and no mass. There is no tenderness. There is no rebound and no guarding.  Musculoskeletal: Normal range of motion. She exhibits no edema and no tenderness.  Neurological: She is alert and oriented to person, place, and time.  Skin: Skin is warm and dry. No rash noted. She is not diaphoretic. No erythema.  Psychiatric: She has a normal mood and affect. Her behavior is normal. Judgment and thought content normal.    Data Reviewed I have reviewed the mammogram and ultrasound films and reports in the MRI films and report with the radiologist. I reviewed the pathology slides and reports for the pathologist. Findings are as noted in the history of present illness.An axillary node was also positive on biopsy.  Assessment    Clinical stage IIB right breast cancer, invasive ductal, upper outer quadrant.    Plan    I had a lengthy discussion with the patient about her diagnosis and options. After this lengthy discussion she is favoring starting neoadjuvant chemotherapy. We did discuss port placement risks and complications. She knows that she needs a second biopsy on the right side to assess the size of the cancer and even with neoadjuvant chemotherapy she may still need a mastectomy. She will need radiation therapy apparently even with a mastectomy so would need to have a deferred reconstruction which we discussed. She also needs a left-sided biopsy to see if she has cancer on that side as well and this could change some of her long-term plans. I have explained the pathophysiology and staging of breast cancer with particular attention to her exact situation. We discussed the multidisciplinary approach to breast cancer which often includes both medical and radiation oncology consultations.  We also discussed surgical options for the treatment of breast cancer including lumpectomy and mastectomy with possible  reconstructive surgery. In addition we talked about the evaluation and management of lymph nodes including a description of sentinel lymph node biopsy and axillary dissections. We reviewed potential complications and risks including bleeding, infection, numbness,  lymphedema, and the potential need for additional surgery.  She understands that for patients who are candidate for lumpectomy or mastectomy there is an equal survival rate with either technique, but a slightly higher local recurrence rate with lumpectomy. In addition she knows that a lumpectomy usually requires postoperative radiation as part of the management of the breast cancer.  We have discussed the likely postoperative course and plans for followup.  I have given the patient some written information that reviewed all of these issues. I believe her questions are answered and that she has a good understanding of the issues.        Akeema Broder J 05/12/2011, 12:48 PM  WU:[email or web address],NWGNFA A, MD, MD

## 2011-05-21 NOTE — Transfer of Care (Signed)
Immediate Anesthesia Transfer of Care Note  Patient: Kirsten Gibson  Procedure(s) Performed: Procedure(s) (LRB): INSERTION PORT-A-CATH (Left)  Patient Location: PACU  Anesthesia Type: General  Level of Consciousness: awake, alert , oriented and patient cooperative  Airway & Oxygen Therapy: Patient Spontanous Breathing and Patient connected to face mask oxygen  Post-op Assessment: Report given to PACU RN, Post -op Vital signs reviewed and stable and Patient moving all extremities  Post vital signs: Reviewed and stable  Complications: No apparent anesthesia complications

## 2011-05-21 NOTE — Preoperative (Signed)
Beta Blockers   Reason not to administer Beta Blockers:Not Applicable 

## 2011-05-21 NOTE — Interval H&P Note (Signed)
History and Physical Interval Note:  05/21/2011 7:19 AM  Kirsten Gibson  has presented today for surgery, with the diagnosis of Stage II right breast cancer  The various methods of treatment have been discussed with the patient and family. After consideration of risks, benefits and other options for treatment, the patient has consented to  Procedure(s) (LRB): INSERTION PORT-A-CATH (Left) as a surgical intervention .  The patients' history has been reviewed, patient examined, no change in status, stable for surgery.  I have reviewed the patients' chart and labs.  Questions were answered to the patient's satisfaction.     [name] J

## 2011-05-21 NOTE — Progress Notes (Signed)
PCXR done

## 2011-05-21 NOTE — Op Note (Signed)
JAICEE MICHELOTTI 08-Apr-1945 [id number] 05/12/2011  Preoperative diagnosis: PAC needed  Postoperative diagnosis: Same  Procedure: Portacath Placement  Surgeon: Currie Paris, MD, FACS  Anesthesia: General  Clinical History and Indications: The patient is getting ready to begin chemotherapy for her cancer. She  needs a Port-A-Cath for venous access.  Description of Procedure: I have seen the patient in the holding area and confirmed the plans for the procedure as noted above. I reviewed the risks and complications again and the patient has no further questions.  The patient was then taken to the operating room. After satisfactory Gen.anesthesia had been obtained the upper chest and lower neck were prepped and draped as a sterile field. The timeout was done.  The left subclavian vein was entered and the guidewire Would not advance. A second attempt was made and an arterial injury occurred. Compression was held for 5 minutes. Another attempt was made and I was able to access the vein and threaded the guidewire in the superior vena cava right atrial area. This is confirmed with fluoroscopy.. An incision was then made on the anterior chest wall and a subcutaneous pocket fashioned for the port reservoir.  The port tubing was then brought through a subcutaneous tunnel from the port site to the guidewire site. The dilator and peel-away sheath were then advanced over the guidewire while monitoring this with fluoroscopy. The guidewire and dilator were removed and the tubing threaded to approximately 22 cm. The peel-away sheath was then removed. The catheter aspirated and flushed easily. Using fluoroscopy the tip was backed out into the superior vena cava right atrial junction area. It aspirated and flushed easily. The reservoir was attached and the locking mechanism engaged. That aspirated and flushed easily.  The reservoir was secured to the fascia with 2 sutures of 2-0 Prolene. A final check with  fluoroscopy was done to make sure we had no kinks and good positioning of the tip of the catheter. Everything appeared to be okay. The catheter was aspirated, flushed with dilute heparin and then concentrated aqueous heparin.  The incision was then closed with interrupted 3-0 Vicryl, and 4-0 Monocryl subcuticular with Dermabond on the skin.  There were no operative complications. Estimated blood loss was minimal. All counts were correct. The patient tolerated the procedure well.  Currie Paris, MD, FACS 05/21/2011 8:27 AM

## 2011-05-21 NOTE — Anesthesia Preprocedure Evaluation (Signed)
Anesthesia Evaluation  Patient identified by MRN, date of birth, ID band Patient awake  General Assessment Comment:Breast ca  Reviewed: Allergy & Precautions, H&P , NPO status , Patient's Chart, lab work & pertinent test results  Airway Mallampati: II TM Distance: >3 FB Neck ROM: Full    Dental No notable dental hx.    Pulmonary neg pulmonary ROS,  breath sounds clear to auscultation  Pulmonary exam normal       Cardiovascular negative cardio ROS  Rhythm:Regular Rate:Normal     Neuro/Psych Anxiety negative neurological ROS     GI/Hepatic negative GI ROS, Neg liver ROS,   Endo/Other  negative endocrine ROS  Renal/GU negative Renal ROS  negative genitourinary   Musculoskeletal negative musculoskeletal ROS (+)   Abdominal   Peds negative pediatric ROS (+)  Hematology negative hematology ROS (+)   Anesthesia Other Findings   Reproductive/Obstetrics negative OB ROS                           Anesthesia Physical Anesthesia Plan  ASA: II  Anesthesia Plan: General   Post-op Pain Management:    Induction: Intravenous  Airway Management Planned: LMA  Additional Equipment:   Intra-op Plan:   Post-operative Plan:   Informed Consent: I have reviewed the patients History and Physical, chart, labs and discussed the procedure including the risks, benefits and alternatives for the proposed anesthesia with the patient or authorized representative who has indicated his/her understanding and acceptance.   Dental advisory given  Plan Discussed with: CRNA  Anesthesia Plan Comments:         Anesthesia Quick Evaluation

## 2011-05-21 NOTE — Progress Notes (Signed)
Has open wounds each breast from bx

## 2011-05-21 NOTE — Anesthesia Postprocedure Evaluation (Signed)
  Anesthesia Post-op Note  Patient: Kirsten Gibson  Procedure(s) Performed: Procedure(s) (LRB): INSERTION PORT-A-CATH (Left)  Patient Location: PACU  Anesthesia Type: General  Level of Consciousness: awake and alert   Airway and Oxygen Therapy: Patient Spontanous Breathing  Post-op Pain: mild  Post-op Assessment: Post-op Vital signs reviewed, Patient's Cardiovascular Status Stable, Respiratory Function Stable, Patent Airway and No signs of Nausea or vomiting  Post-op Vital Signs: stable  Complications: No apparent anesthesia complications

## 2011-05-24 ENCOUNTER — Telehealth (INDEPENDENT_AMBULATORY_CARE_PROVIDER_SITE_OTHER): Payer: Self-pay

## 2011-05-24 ENCOUNTER — Telehealth: Payer: Self-pay | Admitting: *Deleted

## 2011-05-24 ENCOUNTER — Ambulatory Visit (HOSPITAL_BASED_OUTPATIENT_CLINIC_OR_DEPARTMENT_OTHER): Payer: Federal, State, Local not specified - PPO | Admitting: Oncology

## 2011-05-24 VITALS — BP 143/74 | HR 73 | Temp 98.3°F | Ht 60.0 in | Wt 141.1 lb

## 2011-05-24 DIAGNOSIS — C50119 Malignant neoplasm of central portion of unspecified female breast: Secondary | ICD-10-CM

## 2011-05-24 DIAGNOSIS — C50419 Malignant neoplasm of upper-outer quadrant of unspecified female breast: Secondary | ICD-10-CM

## 2011-05-24 DIAGNOSIS — C773 Secondary and unspecified malignant neoplasm of axilla and upper limb lymph nodes: Secondary | ICD-10-CM

## 2011-05-24 NOTE — Progress Notes (Signed)
Hematology and Oncology Follow Up Visit  Kirsten Gibson [id number] Jan 01, 1946 66 y.o. 05/24/2011 6:51 PM PCP dr c Jamey Ripa  Principle Diagnosis: 66 year old with history of locally advanced ER PR positive, HER-2 positive breast cancer involving the right breast.  Interim History:  There have been no intercurrent illness, hospitalizations or medication changes. Patient returns for followup. She had additional biopsies of the left and right breast both of which showed both invasive and in ductal carcinoma in situ. Prognostic panel is not available. The biopsy of the right side was about 5 cm anterior to the existing mass. The patient has not been seen and followed by Dr. Chase Caller for initial discussion had indicated that if this biopsy was positive that she would likely be a candidate for mastectomy. Of note is that she has had a Port-A-Cath placed as well. Medications: I have reviewed the patient's current medications.  Allergies:  Allergies  Allergen Reactions  . Latex Dermatitis and Rash  . Adhesive (Tape)   . Diclofenac Other (See Comments)    Fever, chills  . Naproxen Other (See Comments)    Fever, chills    Past Medical History, Surgical history, Social history, and Family History were reviewed and updated.  Review of Systems: Constitutional:  Negative for fever, chills, night sweats, anorexia, weight loss, pain. Cardiovascular: no chest pain or dyspnea on exertion Respiratory: no cough, shortness of breath, or wheezing Neurological: negative Dermatological: negative ENT: negative Skin Gastrointestinal: negative Genito-Urinary: negative Hematological and Lymphatic: negative Breast: negative Musculoskeletal: negative Remaining ROS negative.  Physical Exam: Blood pressure 143/74, pulse 73, temperature 98.3 F (36.8 C), temperature source Oral, height 5' (1.524 m), weight 141 lb 1.6 oz (64.003 kg). ECOG: 0 General appearance: alert, cooperative and appears stated age Head:  Normocephalic, without obvious abnormality, atraumatic Neck: no adenopathy, no carotid bruit, no JVD, supple, symmetrical, trachea midline and thyroid not enlarged, symmetric, no tenderness/mass/nodules Lymph nodes: Cervical, supraclavicular, and axillary nodes normal. Cardiac : regular rate and rhythm, no murmurs or gallops Pulmonary:clear to auscultation bilaterally and normal percussion bilaterally Breasts: inspection negative, no nipple discharge or bleeding, no masses or nodularity palpable Abdomen:soft, non-tender; bowel sounds normal; no masses,  no organomegaly Extremities negative Neuro: alert, oriented, normal speech, no focal findings or movement disorder noted  Lab Results: Lab Results  Component Value Date   WBC 7.5 05/12/2011   HGB 13.9 05/12/2011   HCT 40.4 05/12/2011   MCV 91.0 05/12/2011   PLT 257 05/12/2011     Chemistry      Component Value Date/Time   NA 137 05/12/2011 0818   K 3.4* 05/12/2011 0818   CL 101 05/12/2011 0818   CO2 29 05/12/2011 0818   BUN 15 05/12/2011 0818   CREATININE 0.65 05/12/2011 0818      Component Value Date/Time   CALCIUM 9.6 05/12/2011 0818   ALKPHOS 92 05/12/2011 0818   AST 22 05/12/2011 0818   ALT 20 05/12/2011 0818   BILITOT 0.4 05/12/2011 0818      .pathology. Radiological Studies: chest X-ray n/a Mammogram n/a Bone density N/a  PET IMPRESSION:  1. Right breast primary with right axillary nodal metastasis.  2. No extrathoracic hypermetabolic metastasis.  3. Mild degradation, secondary to hypermetabolic "brown" fat  within the low neck and upper chest.    Impression and Plan: We had a fairly extensive conversation today regarding various issues including upfront versus delay chemotherapy. I given at the biopsy of the right side is positive she'll likely mastectomy eventually. I discussed  the merits of gland with surgery first versus waiting. From a chemotherapy point of view I do not believe makes a difference as to when she receives  chemotherapy. I suppose additional sampling of her breast tumor may give [place] of an interesting or different result with respect to the her 2 test. I refer back to Dr. Chase Caller to get his thoughts regarding mastectomy. Unlikely that she would benefit from delayed reconstruction. I will plan to see her back in 3 weeks' time. Of note is that the MUGA scan showed a normal ejection fraction.  More than 50% of the visit was spent in patient-related counselling   Pierce Crane, MD 4/15/20136:51 PM

## 2011-05-24 NOTE — Telephone Encounter (Signed)
Patient had appointment with Dr. Donnie Coffin today.  She now needs surgery before treatment- mass much larger.  Per patient Dr. Donnie Coffin stated she needs an appointment this week with Dr. Jamey Ripa. Please call with an time slot ASAP.

## 2011-05-24 NOTE — Telephone Encounter (Signed)
gave patient appointment for [phone] and 06-16-2011 with dr.crenshaw on 06-16-2011 at 9:30am

## 2011-05-25 ENCOUNTER — Ambulatory Visit (INDEPENDENT_AMBULATORY_CARE_PROVIDER_SITE_OTHER): Payer: Federal, State, Local not specified - PPO | Admitting: Surgery

## 2011-05-25 ENCOUNTER — Encounter (INDEPENDENT_AMBULATORY_CARE_PROVIDER_SITE_OTHER): Payer: Self-pay | Admitting: Surgery

## 2011-05-25 VITALS — BP 134/80 | HR 72 | Temp 97.8°F | Resp 18 | Ht 60.0 in | Wt 138.4 lb

## 2011-05-25 DIAGNOSIS — Z09 Encounter for follow-up examination after completed treatment for conditions other than malignant neoplasm: Secondary | ICD-10-CM

## 2011-05-25 NOTE — Telephone Encounter (Signed)
Appt made for patient to be seen today.

## 2011-05-25 NOTE — Patient Instructions (Signed)
We will call you tomorrow after the breast conference

## 2011-05-25 NOTE — Progress Notes (Signed)
She came in today for followup after Port-A-Cath placement on Friday but also did discuss the results of her tissue biopsies. She originally presented with a right breast cancer with axillary metastases. A second area suspicious for DCIS was seen in the right breast and a suspicious area was seen in the left breast. Both of these have now been biopsied. She has also had an MRI scan.  She has done well from the port placement and no complaints.  Exam: The port site is healing nicely with no evidence of infection  Data reviewed the PET scan shows only the known cancer in the right breast and a known axillary metastases. No evidence of distant disease is noted and there is nothing in the left breast on the PET scan. The left breast biopsy shows invasive ductal carcinoma. I did not see good prognostic indicators. A second area in the right breast appears to be DCIS.  It is difficult for me to be sure that we can do an adequate right lumpectomy given these two areas. I think the left side a small and couldn't easily be treated with lumpectomy and sentinel node. She may need a mastectomy on the right. She was initially scheduled for neoadjuvant chemotherapy and it is now being reconsidered by her medical oncologist.  I spell on discussion time with the patient and her husband over these issues. She knows that she will be presented at the breast cancer conference tomorrow morning and I will have more information available at that point in time.

## 2011-05-26 ENCOUNTER — Other Ambulatory Visit: Payer: Self-pay | Admitting: *Deleted

## 2011-05-26 ENCOUNTER — Telehealth (INDEPENDENT_AMBULATORY_CARE_PROVIDER_SITE_OTHER): Payer: Self-pay | Admitting: Surgery

## 2011-05-26 NOTE — Telephone Encounter (Signed)
I called the patient today about the discussion at the breast conference this morning in the review of the films. Although she apparently has two separate breast cancers in her right breast are in the same quadrant and I think this is amenable to a large lumpectomy especially if she has neoadjuvant chemotherapy and the larger cancer shrink significantly. The left side will need a lumpectomy and sentinel node. I think that she is happy with the plan is to proceed to neoadjuvant chemotherapy and have surgery later. I asked her to try to make an appointment to see me about halfway through her chemotherapy and she needs to see me without fail prior to her last chemotherapy so we have ample time to schedule surgery.

## 2011-05-28 ENCOUNTER — Other Ambulatory Visit: Payer: Self-pay | Admitting: Oncology

## 2011-05-28 DIAGNOSIS — C50419 Malignant neoplasm of upper-outer quadrant of unspecified female breast: Secondary | ICD-10-CM

## 2011-05-28 DIAGNOSIS — C50912 Malignant neoplasm of unspecified site of left female breast: Secondary | ICD-10-CM

## 2011-05-28 MED ORDER — PROCHLORPERAZINE 25 MG RE SUPP: 25.0000 mg | Freq: Two times a day (BID) | RECTAL | Status: DC | PRN

## 2011-05-28 MED ORDER — PROCHLORPERAZINE MALEATE 10 MG PO TABS: 10.0000 mg | ORAL_TABLET | Freq: Four times a day (QID) | ORAL | Status: DC | PRN

## 2011-05-28 MED ORDER — DEXAMETHASONE 4 MG PO TABS: ORAL_TABLET | ORAL | Status: DC

## 2011-05-28 MED ORDER — LIDOCAINE-PRILOCAINE 2.5-2.5 % EX CREA: TOPICAL_CREAM | Freq: Once | CUTANEOUS | Status: DC

## 2011-05-28 MED ORDER — ONDANSETRON HCL 8 MG PO TABS: ORAL_TABLET | ORAL | Status: DC

## 2011-05-28 MED ORDER — LORAZEPAM 0.5 MG PO TABS: 0.5000 mg | ORAL_TABLET | Freq: Four times a day (QID) | ORAL | Status: DC | PRN

## 2011-05-31 ENCOUNTER — Telehealth: Payer: Self-pay | Admitting: *Deleted

## 2011-05-31 ENCOUNTER — Other Ambulatory Visit: Payer: Self-pay | Admitting: *Deleted

## 2011-05-31 DIAGNOSIS — C50919 Malignant neoplasm of unspecified site of unspecified female breast: Secondary | ICD-10-CM

## 2011-05-31 MED ORDER — LIDOCAINE-PRILOCAINE 2.5-2.5 % EX CREA: TOPICAL_CREAM | CUTANEOUS | Status: DC

## 2011-05-31 NOTE — Telephone Encounter (Signed)
Called pt to discuss pre-meds and anti-nausea medication.  Received verbal understanding.  Pt denies further needs or concerns at this time.  Encourage pt to call with questions.  Contact information given.

## 2011-06-01 ENCOUNTER — Other Ambulatory Visit: Payer: Self-pay | Admitting: Physician Assistant

## 2011-06-01 DIAGNOSIS — C50912 Malignant neoplasm of unspecified site of left female breast: Secondary | ICD-10-CM

## 2011-06-01 DIAGNOSIS — C50419 Malignant neoplasm of upper-outer quadrant of unspecified female breast: Secondary | ICD-10-CM

## 2011-06-02 ENCOUNTER — Encounter (HOSPITAL_COMMUNITY): Payer: Self-pay | Admitting: Surgery

## 2011-06-02 ENCOUNTER — Ambulatory Visit (HOSPITAL_BASED_OUTPATIENT_CLINIC_OR_DEPARTMENT_OTHER): Payer: Federal, State, Local not specified - PPO

## 2011-06-02 ENCOUNTER — Telehealth: Payer: Self-pay | Admitting: *Deleted

## 2011-06-02 ENCOUNTER — Telehealth: Payer: Self-pay | Admitting: Oncology

## 2011-06-02 VITALS — BP 116/60 | HR 68 | Temp 98.3°F

## 2011-06-02 DIAGNOSIS — C50912 Malignant neoplasm of unspecified site of left female breast: Secondary | ICD-10-CM

## 2011-06-02 DIAGNOSIS — Z5111 Encounter for antineoplastic chemotherapy: Secondary | ICD-10-CM

## 2011-06-02 DIAGNOSIS — C50419 Malignant neoplasm of upper-outer quadrant of unspecified female breast: Secondary | ICD-10-CM

## 2011-06-02 MED ORDER — DIPHENHYDRAMINE HCL 25 MG PO CAPS
50.0000 mg | ORAL_CAPSULE | Freq: Once | ORAL | Status: AC
  Administered 2011-06-02: 50 mg via ORAL

## 2011-06-02 MED ORDER — DOCETAXEL CHEMO INJECTION 160 MG/16ML
75.0000 mg/m2 | Freq: Once | INTRAVENOUS | Status: AC
  Administered 2011-06-02: 120 mg via INTRAVENOUS
  Filled 2011-06-02: qty 12

## 2011-06-02 MED ORDER — ACETAMINOPHEN 325 MG PO TABS
650.0000 mg | ORAL_TABLET | Freq: Once | ORAL | Status: AC
  Administered 2011-06-02: 650 mg via ORAL

## 2011-06-02 MED ORDER — ONDANSETRON 16 MG/50ML IVPB (CHCC)
16.0000 mg | Freq: Once | INTRAVENOUS | Status: AC
  Administered 2011-06-02: 16 mg via INTRAVENOUS

## 2011-06-02 MED ORDER — TRASTUZUMAB CHEMO INJECTION 440 MG
4.0000 mg/kg | Freq: Once | INTRAVENOUS | Status: AC
  Administered 2011-06-02: 252 mg via INTRAVENOUS
  Filled 2011-06-02: qty 12

## 2011-06-02 MED ORDER — DEXAMETHASONE SODIUM PHOSPHATE 4 MG/ML IJ SOLN
20.0000 mg | Freq: Once | INTRAMUSCULAR | Status: AC
  Administered 2011-06-02: 20 mg via INTRAVENOUS

## 2011-06-02 MED ORDER — SODIUM CHLORIDE 0.9 % IV SOLN
670.0000 mg | Freq: Once | INTRAVENOUS | Status: AC
  Administered 2011-06-02: 670 mg via INTRAVENOUS
  Filled 2011-06-02: qty 67

## 2011-06-02 MED ORDER — HEPARIN SOD (PORK) LOCK FLUSH 100 UNIT/ML IV SOLN
500.0000 [IU] | Freq: Once | INTRAVENOUS | Status: DC | PRN
  Filled 2011-06-02: qty 5

## 2011-06-02 MED ORDER — SODIUM CHLORIDE 0.9 % IJ SOLN
10.0000 mL | INTRAMUSCULAR | Status: DC | PRN
  Filled 2011-06-02: qty 10

## 2011-06-02 MED ORDER — SODIUM CHLORIDE 0.9 % IV SOLN
Freq: Once | INTRAVENOUS | Status: AC
  Administered 2011-06-02: 11:00:00 via INTRAVENOUS

## 2011-06-02 NOTE — Telephone Encounter (Signed)
Per staff message from [place] I have scheduled the patient's treatment. Crystal advised appts in computer.  JMW

## 2011-06-02 NOTE — Patient Instructions (Signed)
[facility] Discharge Instructions for Patients Receiving Chemotherapy  Today you received the following chemotherapy agents docetaxel, carboplatin, herceptin To help prevent nausea and vomiting after your treatment, we encourage you to take your nausea medication. Begin taking it at 8pm and take it as often as prescribed for the next 24-72 hours.   If you develop nausea and vomiting that is not controlled by your nausea medication, call the clinic. If it is after clinic hours your family physician or the after hours number for the clinic or go to the Emergency Department.   BELOW ARE SYMPTOMS THAT SHOULD BE REPORTED IMMEDIATELY:  *FEVER GREATER THAN 100.5 F  *CHILLS WITH OR WITHOUT FEVER  NAUSEA AND VOMITING THAT IS NOT CONTROLLED WITH YOUR NAUSEA MEDICATION  *UNUSUAL SHORTNESS OF BREATH  *UNUSUAL BRUISING OR BLEEDING  TENDERNESS IN MOUTH AND THROAT WITH OR WITHOUT PRESENCE OF ULCERS  *URINARY PROBLEMS  *BOWEL PROBLEMS  UNUSUAL RASH Items with * indicate a potential emergency and should be followed up as soon as possible.  One of the nurses will contact you 24 hours after your treatment. Please let the nurse know about any problems that you may have experienced. Feel free to call the clinic you have any questions or concerns. The clinic phone number is [phone].   I have been informed and understand all the instructions given to me. I know to contact the clinic, my physician, or go to the Emergency Department if any problems should occur. I do not have any questions at this time, but understand that I may call the clinic during office hours   should I have any questions or need assistance in obtaining follow up care.    __________________________________________  _____________  __________ Signature of Patient or Authorized Representative            Date                   Time    __________________________________________ Nurse's Signature

## 2011-06-02 NOTE — Telephone Encounter (Signed)
S/w michelle the chemo scheduler to gve the pt an appt calendar while she is in tx today

## 2011-06-03 ENCOUNTER — Telehealth: Payer: Self-pay | Admitting: Oncology

## 2011-06-03 ENCOUNTER — Telehealth: Payer: Self-pay | Admitting: *Deleted

## 2011-06-03 ENCOUNTER — Encounter: Payer: Self-pay | Admitting: *Deleted

## 2011-06-03 ENCOUNTER — Ambulatory Visit (HOSPITAL_BASED_OUTPATIENT_CLINIC_OR_DEPARTMENT_OTHER): Payer: Federal, State, Local not specified - PPO

## 2011-06-03 VITALS — BP 131/72 | HR 58 | Temp 97.5°F

## 2011-06-03 DIAGNOSIS — C50912 Malignant neoplasm of unspecified site of left female breast: Secondary | ICD-10-CM

## 2011-06-03 DIAGNOSIS — C773 Secondary and unspecified malignant neoplasm of axilla and upper limb lymph nodes: Secondary | ICD-10-CM

## 2011-06-03 DIAGNOSIS — C50419 Malignant neoplasm of upper-outer quadrant of unspecified female breast: Secondary | ICD-10-CM

## 2011-06-03 DIAGNOSIS — C50119 Malignant neoplasm of central portion of unspecified female breast: Secondary | ICD-10-CM

## 2011-06-03 DIAGNOSIS — Z5189 Encounter for other specified aftercare: Secondary | ICD-10-CM

## 2011-06-03 MED ORDER — PEGFILGRASTIM INJECTION 6 MG/0.6ML
6.0000 mg | Freq: Once | SUBCUTANEOUS | Status: AC
  Administered 2011-06-03: 6 mg via SUBCUTANEOUS
  Filled 2011-06-03: qty 0.6

## 2011-06-03 NOTE — Telephone Encounter (Signed)
Patient here for Neulasta injection following first chemotherapy yesterday.   No problems with N/V/D.  Is eating and drinking as instructed.  Only concern is rash on upper chest area.  Could be reaction to tape.  Will notify MD of this.

## 2011-06-03 NOTE — Telephone Encounter (Signed)
The patient came in today for her neulasta injection.   Asked the scheduler for a different Reach to Recovery Volunteer- so I was called.   I met with the patient and her husband for about 45 minutes.   She had many questions.   She was very teary eyed and expressed a lack of knowledge and a fear of her diagnosis- "I am the only one at support group or that anyone knows that will have to have chemo for a year"  I spent most of my time explaining her treatments, that herceptin is not chemotherapy- and she will be carefully monitored.   She is concerned because she took a z pack long ago and read that can cause heart disease-and now she is going to be on herceptin and that can cause heart disease.  I explained that the echo evaluates her baseline and she will be closely monitored.    Much support and info given.  I contacted Melissa Vogelsinger, the Reach Coordinator and asked her to call herself- as a Careers information officer.   I relayed this information to Debbora Presto, PA and Pierce Crane, MD as well as Marianne Sofia, Breast Clinic Navigator.  I will also ask the patient and family support team to contact the patient to offer services.   The patient showed me an area on her chest that was oblong.   It was slightly red but intact.   Clearly this appeared to be where tegaderm had been placed over her port for her infusion yesterday.   I relayed this information to Gladis Riffle who met with her yesterday.

## 2011-06-04 ENCOUNTER — Encounter: Payer: Self-pay | Admitting: *Deleted

## 2011-06-04 NOTE — Progress Notes (Signed)
Pt reports that she has experienced "acid reflux" after dinner 06/03/11 1 day after her first chemo tx. This desk nurse suggested she take otc zantac 30 minutes prior to eating meals. Other suggestions have been offered for example small meals, avoiding spicy and greasy foods. Avoiding caffeine and avoiding reclining position 2 to 3  Hours after meals. Pt will call this desk if sx worsen

## 2011-06-07 ENCOUNTER — Encounter: Payer: Self-pay | Admitting: Psychiatry

## 2011-06-09 ENCOUNTER — Ambulatory Visit: Payer: Federal, State, Local not specified - PPO | Admitting: Physician Assistant

## 2011-06-09 ENCOUNTER — Ambulatory Visit (HOSPITAL_BASED_OUTPATIENT_CLINIC_OR_DEPARTMENT_OTHER): Payer: Federal, State, Local not specified - PPO | Admitting: Physician Assistant

## 2011-06-09 ENCOUNTER — Ambulatory Visit: Payer: Federal, State, Local not specified - PPO

## 2011-06-09 ENCOUNTER — Other Ambulatory Visit (HOSPITAL_BASED_OUTPATIENT_CLINIC_OR_DEPARTMENT_OTHER): Payer: Federal, State, Local not specified - PPO | Admitting: Lab

## 2011-06-09 ENCOUNTER — Ambulatory Visit (HOSPITAL_BASED_OUTPATIENT_CLINIC_OR_DEPARTMENT_OTHER): Payer: Federal, State, Local not specified - PPO

## 2011-06-09 ENCOUNTER — Encounter: Payer: Self-pay | Admitting: *Deleted

## 2011-06-09 ENCOUNTER — Other Ambulatory Visit: Payer: Federal, State, Local not specified - PPO | Admitting: Lab

## 2011-06-09 VITALS — BP 138/81 | HR 94 | Temp 98.7°F | Ht 60.0 in | Wt 132.5 lb

## 2011-06-09 DIAGNOSIS — C773 Secondary and unspecified malignant neoplasm of axilla and upper limb lymph nodes: Secondary | ICD-10-CM

## 2011-06-09 DIAGNOSIS — C50419 Malignant neoplasm of upper-outer quadrant of unspecified female breast: Secondary | ICD-10-CM

## 2011-06-09 DIAGNOSIS — C50912 Malignant neoplasm of unspecified site of left female breast: Secondary | ICD-10-CM

## 2011-06-09 DIAGNOSIS — Z170 Estrogen receptor positive status [ER+]: Secondary | ICD-10-CM

## 2011-06-09 DIAGNOSIS — C50119 Malignant neoplasm of central portion of unspecified female breast: Secondary | ICD-10-CM

## 2011-06-09 DIAGNOSIS — Z5112 Encounter for antineoplastic immunotherapy: Secondary | ICD-10-CM

## 2011-06-09 LAB — CBC WITH DIFFERENTIAL/PLATELET
Basophils Absolute: 0 10*3/uL (ref 0.0–0.1)
Eosinophils Absolute: 0 10*3/uL (ref 0.0–0.5)
HCT: 39.5 % (ref 34.8–46.6)
HGB: 13.7 g/dL (ref 11.6–15.9)
MCV: 87.2 fL (ref 79.5–101.0)
MONO%: 9.5 % (ref 0.0–14.0)
NEUT#: 14.8 10*3/uL — ABNORMAL HIGH (ref 1.5–6.5)
RDW: 12.5 % (ref 11.2–14.5)

## 2011-06-09 MED ORDER — TRASTUZUMAB CHEMO INJECTION 440 MG
2.0000 mg/kg | Freq: Once | INTRAVENOUS | Status: AC
  Administered 2011-06-09: 126 mg via INTRAVENOUS
  Filled 2011-06-09: qty 6

## 2011-06-09 MED ORDER — DIPHENHYDRAMINE HCL 25 MG PO CAPS
50.0000 mg | ORAL_CAPSULE | Freq: Once | ORAL | Status: AC
  Administered 2011-06-09: 50 mg via ORAL

## 2011-06-09 MED ORDER — SODIUM CHLORIDE 0.9 % IJ SOLN
10.0000 mL | INTRAMUSCULAR | Status: DC | PRN
  Administered 2011-06-09: 10 mL
  Filled 2011-06-09: qty 10

## 2011-06-09 MED ORDER — SODIUM CHLORIDE 0.9 % IV SOLN
Freq: Once | INTRAVENOUS | Status: AC
  Administered 2011-06-09: 10:00:00 via INTRAVENOUS

## 2011-06-09 MED ORDER — HEPARIN SOD (PORK) LOCK FLUSH 100 UNIT/ML IV SOLN
500.0000 [IU] | Freq: Once | INTRAVENOUS | Status: AC | PRN
  Administered 2011-06-09: 500 [IU]
  Filled 2011-06-09: qty 5

## 2011-06-09 MED ORDER — ACETAMINOPHEN 325 MG PO TABS
650.0000 mg | ORAL_TABLET | Freq: Once | ORAL | Status: AC
  Administered 2011-06-09: 650 mg via ORAL

## 2011-06-09 NOTE — Patient Instructions (Signed)
1. Your treatment course:                                         Taxotere/Carboplatin/Herceptin, given in combination one "week one"                                         Herceptin alone on "week two" and "week three"                                                                                 This treatment [facility]")  is given every 3 weeks for a total of 6 cycles (ie, 1 cycle= week 1, 2, and 3)  2. After 3 cycles, we will re-image with breast MRI, and schedule follow up with Dr. Jamey Ripa.  Again, we will re-image after the 6th cycle, and plan surgery with Dr.Streck.   3. Pertaining to Decadron:                                         You will take 8mg  (two 4mg  tabs) on 06/22/11,                                         You will hold AM dose on 06/23/11, but will take PM dose of 8mg .                                         You will take 8mg  in AM and PM on 06/24/11                                         Then STOP

## 2011-06-09 NOTE — Patient Instructions (Signed)
[facility] Discharge Instructions for Patients Receiving Chemotherapy  Today you received the following chemotherapy agents Herceptin To help prevent nausea and vomiting after your treatment, we encourage you to take your nausea medication Begin taking and take it as often as prescribed   If you develop nausea and vomiting that is not controlled by your nausea medication, call the clinic. If it is after clinic hours your family physician or the after hours number for the clinic or go to the Emergency Department.   BELOW ARE SYMPTOMS THAT SHOULD BE REPORTED IMMEDIATELY:  *FEVER GREATER THAN 100.5 F  *CHILLS WITH OR WITHOUT FEVER  NAUSEA AND VOMITING THAT IS NOT CONTROLLED WITH YOUR NAUSEA MEDICATION  *UNUSUAL SHORTNESS OF BREATH  *UNUSUAL BRUISING OR BLEEDING  TENDERNESS IN MOUTH AND THROAT WITH OR WITHOUT PRESENCE OF ULCERS  *URINARY PROBLEMS  *BOWEL PROBLEMS  UNUSUAL RASH Items with * indicate a potential emergency and should be followed up as soon as possible.  One of the nurses will contact you 24 hours after your treatment. Please let the nurse know about any problems that you may have experienced. Feel free to call the clinic you have any questions or concerns. The clinic phone number is [phone].   I have been informed and understand all the instructions given to me. I know to contact the clinic, my physician, or go to the Emergency Department if any problems should occur. I do not have any questions at this time, but understand that I may call the clinic during office hours   should I have any questions or need assistance in obtaining follow up care.    __________________________________________  _____________  __________ Signature of Patient or Authorized Representative            Date                   Time    __________________________________________ Nurse's Signature

## 2011-06-09 NOTE — Progress Notes (Signed)
Pt calls for clarification on use of decadron. Per CS, it is not necessary for pt to take decadron on Herceptin days. In the future, during taxol tx pt is to take Dex 8mg  BID the day before tx, 8mg  BID  the day after tx and 8mg  the evening on  the day of tx (she will get 20mg  dex during tx)

## 2011-06-09 NOTE — Progress Notes (Signed)
Hematology and Oncology Follow Up Visit  Kirsten Gibson [id number] [birth year] 66 y.o. 06/09/2011    HPI: Kirsten Gibson is a 66 year old [place] woman with newly diagnosed bilateral breast carcinoma now being treated in the neoadjuvant setting, due for week 2 cycle 1/6 q3week TCH, with weekly Herceptin and Neulasta support on day 2.   -Infiltrating ductal carcinoma of the right breast, ER/PR/HER-2 positive with positive axillary lymph node involvement   -Infiltrating ductal carcinoma the left breast, ER/PR positive HER-2 negative.  Interim History:   Kirsten Gibson is seen today with her husband in accompaniment for followup after her first of 6 planned doses of every 2 week [facility] with weekly Herceptin being given in the neoadjuvant setting. Overall, she is actually feeling pretty well denying any unexplained fevers, chills, or night sweats. She's had no difficulty with nausea, but has had heartburn symptoms for which over-the-counter Zantac has been very effective. She did have some constipation issues, she has had relief over the past 24 hours. She also experienced profound fatigue but this is also improving. She received Neulasta on day 2, she denies any diffuse bone pain. She has not noted any skin changes, no neuropathy symptoms, nailbed sensitivity, or excessive eye tearing. A detailed review of systems is otherwise noncontributory as noted below.  Review of Systems: Constitutional:  no weight loss, fever, night sweats and fatigued Eyes: uses glasses ENT: no complaints Cardiovascular: no chest pain or dyspnea on exertion Respiratory: no cough, shortness of breath, or wheezing Neurological: no TIA or stroke symptoms Dermatological: History of mycosis fungoides. Gastrointestinal: no abdominal pain, change in bowel habits, or black or bloody stools Genito-Urinary: no dysuria, trouble voiding, or hematuria Hematological and Lymphatic: negative Breast: known bilateral breast  cancer Musculoskeletal: negative Remaining ROS negative.   Medications:   I have reviewed the patient's current medications.  Current Outpatient Prescriptions  Medication Sig Dispense Refill  . ALPRAZolam (XANAX) 0.25 MG tablet Take 1 tablet (0.25 mg total) by mouth at bedtime as needed for sleep.  30 tablet  0  . calcium carbonate (OS-CAL) 600 MG TABS Take 600 mg by mouth 3 (three) times daily with meals.      . Cholecalciferol (VITAMIN D3 PO) Take 1,200 mg by mouth daily.      [name] dexamethasone (DECADRON) 4 MG tablet Take 2 tablets two times a day the day before Taxotere. Then take 2 tabs two times a day starting the day after chemo for 3 days.  30 tablet  1  . lidocaine-prilocaine (EMLA) cream APPLY TO PORT A CATH AS DIRECTED BEFORE CHEMO TREATMENT.  30 g  3  . LORazepam (ATIVAN) 0.5 MG tablet Take 1 tablet (0.5 mg total) by mouth every 6 (six) hours as needed (Nausea or vomiting).  30 tablet  0  . ondansetron (ZOFRAN) 8 MG tablet Take 1 tablet two times a day starting the day after chemo for 3 days. Then take 1 tab two times a day as needed for nausea or vomiting.  30 tablet  1  . prochlorperazine (COMPAZINE) 10 MG tablet Take 1 tablet (10 mg total) by mouth every 6 (six) hours as needed (Nausea or vomiting).  30 tablet  1  . prochlorperazine (COMPAZINE) 25 MG suppository Place 1 suppository (25 mg total) rectally every 12 (twelve) hours as needed for nausea.  12 suppository  3   No current facility-administered medications for this visit.   Facility-Administered Medications Ordered in Other Visits  Medication Dose Route Frequency Provider Last  Rate Last Dose  . lidocaine-prilocaine (EMLA) cream   Topical Once Pierce Crane, MD        Allergies:  Allergies  Allergen Reactions  . Latex Dermatitis and Rash  . Adhesive (Tape)     ALLERGIC TO OP-SITE......USE OP-SITE FLEXIGRID INSTEAD !!!  . Diclofenac Other (See Comments)    Fever, chills  . Naproxen Other (See Comments)    Fever,  chills    Physical Exam: Filed Vitals:   06/09/11 0843  BP: 138/81  Pulse: 94  Temp: 98.7 F (37.1 C)    Body mass index is 25.88 kg/(m^2). Weight: 132 lbs. HEENT:  Sclerae anicteric, conjunctivae pink.  Oropharynx clear.  No mucositis or candidiasis.   Nodes:  No cervical, supraclavicular, or axillary lymphadenopathy palpated.  Breast Exam:  Deferred.  Lungs:  Clear to auscultation bilaterally.  No crackles, rhonchi, or wheezes.   Heart:  Regular rate and rhythm.   Abdomen:  Soft, nontender.  Positive bowel sounds.  No organomegaly or masses palpated.   Musculoskeletal:  No focal spinal tenderness to palpation.  Extremities:  Benign.  No peripheral edema or cyanosis.   Skin:  Benign, no PPE changes. Neuro:  Nonfocal, alert and oriented x 3.   Lab Results: Lab Results  Component Value Date   WBC 17.0* 06/09/2011   HGB 13.7 06/09/2011   HCT 39.5 06/09/2011   MCV 87.2 06/09/2011   PLT 138* 06/09/2011   NEUTROABS 14.8* 06/09/2011     Chemistry      Component Value Date/Time   NA 137 05/12/2011 0818   K 3.4* 05/12/2011 0818   CL 101 05/12/2011 0818   CO2 29 05/12/2011 0818   BUN 15 05/12/2011 0818   CREATININE 0.65 05/12/2011 0818      Component Value Date/Time   CALCIUM 9.6 05/12/2011 0818   ALKPHOS 92 05/12/2011 0818   AST 22 05/12/2011 0818   ALT 20 05/12/2011 0818   BILITOT 0.4 05/12/2011 0818      Lab Results  Component Value Date   LABCA2 25 05/12/2011     Assessment:  Kirsten Gibson is a 66 year old [place] woman with newly diagnosed bilateral breast carcinoma now being treated in the neoadjuvant setting, due for week 2 cycle 1/6 q3week TCH, with weekly Herceptin and Neulasta support on day 2.   -Infiltrating ductal carcinoma of the right breast, ER/PR/HER-2 positive with positive axillary lymph node involvement   -Infiltrating ductal carcinoma the left breast, ER/PR positive HER-2 negative.  Case reviewed with Dr. Pierce Crane.  Plan:  Kirsten Gibson receive week 2  single agent Herceptin today as scheduled. I will not make any adjustments in her premeds. I will see her in one week's time with labs prior, for assessment in anticipation of week 3 cycle 1 of TCH which will again be single agent Herceptin. She and her husband know that they can feel free to contact us prior if the need should arise.  This plan was reviewed with the patient, who voices understanding and agreement.  She knows to call with any changes or problems.    Sherra Kimmons T, PA-C 06/09/2011

## 2011-06-11 ENCOUNTER — Telehealth: Payer: Self-pay | Admitting: Oncology

## 2011-06-11 ENCOUNTER — Encounter (INDEPENDENT_AMBULATORY_CARE_PROVIDER_SITE_OTHER): Payer: Self-pay

## 2011-06-11 NOTE — Telephone Encounter (Signed)
Pt has appt. With Dr. Lenis Noon at [facility] on 06/25/11 @ 1:00. Faxed medical records, slides and scans will be fedex'ed. Pt is aware.

## 2011-06-14 ENCOUNTER — Ambulatory Visit: Payer: Federal, State, Local not specified - PPO | Admitting: Nutrition

## 2011-06-14 NOTE — Assessment & Plan Note (Signed)
Kirsten Gibson is a 66 year old female patient of Dr. Renelda Loma diagnosed with bilateral breast cancer which is ER/PR positive.    History includes anxiety and a lymphoma diagnosed in 2005.    Medications include Xanax, Os-Cal, vitamin D3, Decadron, Ativan, Zofran and Compazine.    Labs were reviewed.    Height 5 feet, weight 132.5 pounds, usual body weight 140 pounds.  BMI is 25.88.  The patient is requesting information on foods she should be eating during her cancer treatments.  She does report a poor appetite with taste alteration.  She has been troubled with acid reflux and has recently had diarrhea as a result of antibiotics given for dermatological condition.  She reports that her diet is generally healthy plant based diet with lots of vegetables and dry beans and peas. She did consume soy foods several times a day but now has concerns about that contributing to her cancer.  NUTRITION DIAGNOSIS:  Food and nutrition related knowledge deficit related to new diagnosis of breast cancer and associated treatments as evidenced by no prior need for nutrition related information.  INTERVENTION:  I educated the patient on the importance of a healthy plant based diet with adequate calories and protein to minimize weight loss throughout treatment.  I have encouraged the patient to consume protein in 5 or 6 amounts throughout the day.  I have provided her with a list of acceptable protein foods.  I have encouraged her to consume lower fat plant based proteins when possible.  I have educated her on soy consumption and provided her with evidence based recommendations on soy proteins.  I have also reviewed the importance of staying ahead of nausea with her medications and what to do with her foods if she does develop some nausea.  I have also given her suggestions and education on dealing with taste alterations.  I provided her multiple handouts and some oral nutrition supplements that she could drink when she is  not feeling well in order to keep her caloric intake up to minimize further weight loss.  MONITORING/EVALUATION (GOALS):  The patient will tolerate a healthy plant based diet with adequate calories and protein to maintain weight.  NEXT VISIT:  The patient has my contact information and will call with questions or concerns.    ______________________________ Zenovia Jarred, RD, LDN Clinical Nutrition Specialist BN/MEDQ  D:  06/14/2011  T:  06/14/2011  Job:  [id number]

## 2011-06-15 ENCOUNTER — Encounter: Payer: Self-pay | Admitting: *Deleted

## 2011-06-15 NOTE — Progress Notes (Signed)
Clinical Social Worker was referred for additional psychosocial support.  CSW contacted pt to assess for needs and concerns.  Pt stated she was doing "ok", and not experiencing any distress at this time.  CSW discussed support services available at [facility], and importance of emotional support during treatment.  Pt acknowledged the need for support, and has participated in the reach to recovery program and breast cancer support group.  Pt recognized the timely response of her reach to recovery experience, but stated she would prefer speaking with someone with her exact diagnosis.  CSW validated the pt's concerns, and informed pt that there are limited mentors available and pt's are matched as closely as possible.  CSW informed pt of the multiple online mentor programs and programs offered through other breast cancer organizations; which may have a larger database to pull from when matching patients and survivors.  CSW and pt reviewed the organizations listed in the Journey book, which pt plans to explore.  CSW encouraged pt to consider one on one counseling services offered at [facility] in addition to mentor/survivorship support.  Pt was encouraged to call with any other questions or concerns.  Tamala Julian, MSW, LCSW Clinical Social Worker [facility] [phone]

## 2011-06-16 ENCOUNTER — Ambulatory Visit (HOSPITAL_BASED_OUTPATIENT_CLINIC_OR_DEPARTMENT_OTHER): Payer: Federal, State, Local not specified - PPO | Admitting: Physician Assistant

## 2011-06-16 ENCOUNTER — Ambulatory Visit (HOSPITAL_BASED_OUTPATIENT_CLINIC_OR_DEPARTMENT_OTHER): Payer: Federal, State, Local not specified - PPO

## 2011-06-16 ENCOUNTER — Telehealth: Payer: Self-pay | Admitting: Oncology

## 2011-06-16 ENCOUNTER — Telehealth (INDEPENDENT_AMBULATORY_CARE_PROVIDER_SITE_OTHER): Payer: Self-pay | Admitting: General Surgery

## 2011-06-16 ENCOUNTER — Other Ambulatory Visit (HOSPITAL_BASED_OUTPATIENT_CLINIC_OR_DEPARTMENT_OTHER): Payer: Federal, State, Local not specified - PPO | Admitting: Lab

## 2011-06-16 ENCOUNTER — Encounter: Payer: Self-pay | Admitting: Physician Assistant

## 2011-06-16 VITALS — BP 143/83 | HR 73 | Temp 98.2°F | Ht 60.0 in | Wt 134.0 lb

## 2011-06-16 DIAGNOSIS — Z170 Estrogen receptor positive status [ER+]: Secondary | ICD-10-CM

## 2011-06-16 DIAGNOSIS — C50419 Malignant neoplasm of upper-outer quadrant of unspecified female breast: Secondary | ICD-10-CM

## 2011-06-16 DIAGNOSIS — C50119 Malignant neoplasm of central portion of unspecified female breast: Secondary | ICD-10-CM

## 2011-06-16 DIAGNOSIS — C50912 Malignant neoplasm of unspecified site of left female breast: Secondary | ICD-10-CM

## 2011-06-16 DIAGNOSIS — C773 Secondary and unspecified malignant neoplasm of axilla and upper limb lymph nodes: Secondary | ICD-10-CM

## 2011-06-16 DIAGNOSIS — Z5111 Encounter for antineoplastic chemotherapy: Secondary | ICD-10-CM

## 2011-06-16 DIAGNOSIS — C50911 Malignant neoplasm of unspecified site of right female breast: Secondary | ICD-10-CM

## 2011-06-16 LAB — CBC WITH DIFFERENTIAL/PLATELET
BASO%: 0.3 % (ref 0.0–2.0)
Eosinophils Absolute: 0 10*3/uL (ref 0.0–0.5)
LYMPH%: 16.5 % (ref 14.0–49.7)
MCHC: 34.1 g/dL (ref 31.5–36.0)
MONO#: 0.7 10*3/uL (ref 0.1–0.9)
NEUT#: 12.2 10*3/uL — ABNORMAL HIGH (ref 1.5–6.5)
Platelets: 163 10*3/uL (ref 145–400)
RBC: 3.89 10*6/uL (ref 3.70–5.45)
RDW: 12.9 % (ref 11.2–14.5)
WBC: 15.4 10*3/uL — ABNORMAL HIGH (ref 3.9–10.3)
nRBC: 0 % (ref 0–0)

## 2011-06-16 MED ORDER — DIPHENHYDRAMINE HCL 25 MG PO CAPS
50.0000 mg | ORAL_CAPSULE | Freq: Once | ORAL | Status: AC
  Administered 2011-06-16: 50 mg via ORAL

## 2011-06-16 MED ORDER — SODIUM CHLORIDE 0.9 % IJ SOLN
10.0000 mL | INTRAMUSCULAR | Status: DC | PRN
  Administered 2011-06-16: 10 mL
  Filled 2011-06-16: qty 10

## 2011-06-16 MED ORDER — TRASTUZUMAB CHEMO INJECTION 440 MG
2.0000 mg/kg | Freq: Once | INTRAVENOUS | Status: AC
  Administered 2011-06-16: 126 mg via INTRAVENOUS
  Filled 2011-06-16: qty 6

## 2011-06-16 MED ORDER — ACETAMINOPHEN 325 MG PO TABS
650.0000 mg | ORAL_TABLET | Freq: Once | ORAL | Status: AC
  Administered 2011-06-16: 650 mg via ORAL

## 2011-06-16 MED ORDER — HEPARIN SOD (PORK) LOCK FLUSH 100 UNIT/ML IV SOLN
500.0000 [IU] | Freq: Once | INTRAVENOUS | Status: AC | PRN
  Administered 2011-06-16: 500 [IU]
  Filled 2011-06-16: qty 5

## 2011-06-16 MED ORDER — SODIUM CHLORIDE 0.9 % IV SOLN: Freq: Once | INTRAVENOUS | Status: DC

## 2011-06-16 NOTE — Telephone Encounter (Signed)
Dr. Corky Downs, at [facility], has been seeing pt for lymphoma and breast cancer.  She is asking if Dr. Jamey Ripa, during the upcoming surgery, will perform a core biopsy of the neck node for her.  She can be reached by email ( [email or web address] or by phone [phone]) with results.  Dr. Orlie Dakin office has FAXd their notes.

## 2011-06-16 NOTE — Telephone Encounter (Signed)
gve the pt her may 2013 appt calendar 

## 2011-06-16 NOTE — Patient Instructions (Signed)
[facility] Discharge Instructions for Patients Receiving Chemotherapy  Today you received the following chemotherapy agents Herceptin To help prevent nausea and vomiting after your treatment, we encourage you to take your nausea medication as prescribed.  If you develop nausea and vomiting that is not controlled by your nausea medication, call the clinic. If it is after clinic hours your family physician or the after hours number for the clinic or go to the Emergency Department.   BELOW ARE SYMPTOMS THAT SHOULD BE REPORTED IMMEDIATELY:  *FEVER GREATER THAN 100.5 F  *CHILLS WITH OR WITHOUT FEVER  NAUSEA AND VOMITING THAT IS NOT CONTROLLED WITH YOUR NAUSEA MEDICATION  *UNUSUAL SHORTNESS OF BREATH  *UNUSUAL BRUISING OR BLEEDING  TENDERNESS IN MOUTH AND THROAT WITH OR WITHOUT PRESENCE OF ULCERS  *URINARY PROBLEMS  *BOWEL PROBLEMS  UNUSUAL RASH Items with * indicate a potential emergency and should be followed up as soon as possible.  One of the nurses will contact you 24 hours after your treatment. Please let the nurse know about any problems that you may have experienced. Feel free to call the clinic you have any questions or concerns. The clinic phone number is [phone].   I have been informed and understand all the instructions given to me. I know to contact the clinic, my physician, or go to the Emergency Department if any problems should occur. I do not have any questions at this time, but understand that I may call the clinic during office hours   should I have any questions or need assistance in obtaining follow up care.    __________________________________________  _____________  __________ Signature of Patient or Authorized Representative            Date                   Time    __________________________________________ Nurse's Signature    

## 2011-06-16 NOTE — Progress Notes (Signed)
Hematology and Oncology Follow Up Visit  Kirsten Gibson [id number] March 27, 1945 66 y.o. 06/16/2011    HPI: Kirsten Gibson is a 66 year old [place] woman with newly diagnosed bilateral breast carcinoma now being treated in the neoadjuvant setting, due for week 3 cycle 1/6 q3week TCH, with weekly Herceptin and Neulasta support on day 2.   -Infiltrating ductal carcinoma of the right breast, ER/PR/HER-2 positive with positive axillary lymph node involvement   -Infiltrating ductal carcinoma the left breast, ER/PR positive HER-2 negative.  Interim History:   Kirsten Gibson is seen today with her husband in accompaniment in anticipation of week 3 cycle 1 of 6 planned every 2 week doses of neoadjuvant TCH, which will be single agent Herceptin. Overall, she is actually feeling pretty well denying any unexplained fevers, chills, or night sweats. She's continuing to have no difficulty with nausea. Prior constipation issues have resolved. She had experienced profound fatigue but this has improved.  She has not noted any skin changes, no neuropathy symptoms, nailbed sensitivity, or excessive eye tearing.  Of note, she is scheduled to see Dr. Lenis Noon at [facility] on 06/25/11 for second opinion. A detailed review of systems is otherwise noncontributory as noted below.  Review of Systems: Constitutional:  no weight loss, fever, night sweats and fatigued Eyes: uses glasses ENT: no complaints Cardiovascular: no chest pain or dyspnea on exertion Respiratory: no cough, shortness of breath, or wheezing Neurological: no TIA or stroke symptoms Dermatological: History of mycosis fungoides. Gastrointestinal: no abdominal pain, change in bowel habits, or black or bloody stools Genito-Urinary: no dysuria, trouble voiding, or hematuria Hematological and Lymphatic: negative Breast: known bilateral breast cancer Musculoskeletal: negative Remaining ROS negative.   Medications:   I have reviewed the patient's current  medications.  Current Outpatient Prescriptions  Medication Sig Dispense Refill  . calcium carbonate (OS-CAL) 600 MG TABS Take 600 mg by mouth 3 (three) times daily with meals.      . Cholecalciferol (VITAMIN D3 PO) Take 1,200 mg by mouth daily.      [name] dexamethasone (DECADRON) 4 MG tablet Take 2 tablets two times a day the day before Taxotere. Then take 2 tabs two times a day starting the day after chemo for 3 days.  30 tablet  1  . lidocaine-prilocaine (EMLA) cream APPLY TO PORT A CATH AS DIRECTED BEFORE CHEMO TREATMENT.  30 g  3  . LORazepam (ATIVAN) 0.5 MG tablet Take 1 tablet (0.5 mg total) by mouth every 6 (six) hours as needed (Nausea or vomiting).  30 tablet  0  . ondansetron (ZOFRAN) 8 MG tablet Take 1 tablet two times a day starting the day after chemo for 3 days. Then take 1 tab two times a day as needed for nausea or vomiting.  30 tablet  1  . prochlorperazine (COMPAZINE) 10 MG tablet Take 1 tablet (10 mg total) by mouth every 6 (six) hours as needed (Nausea or vomiting).  30 tablet  1  . prochlorperazine (COMPAZINE) 25 MG suppository Place 1 suppository (25 mg total) rectally every 12 (twelve) hours as needed for nausea.  12 suppository  3   No current facility-administered medications for this visit.   Facility-Administered Medications Ordered in Other Visits  Medication Dose Route Frequency Provider Last Rate Last Dose  . lidocaine-prilocaine (EMLA) cream   Topical Once Pierce Crane, MD        Allergies:  Allergies  Allergen Reactions  . Latex Dermatitis and Rash  . Adhesive (Tape)  ALLERGIC TO OP-SITE......USE OP-SITE FLEXIGRID INSTEAD !!!  . Diclofenac Other (See Comments)    Fever, chills  . Naproxen Other (See Comments)    Fever, chills    Physical Exam: Filed Vitals:   06/16/11 1256  BP: 143/83  Pulse: 73  Temp: 98.2 F (36.8 C)    Body mass index is 26.17 kg/(m^2). Weight: 134 lbs. HEENT:  Sclerae anicteric, conjunctivae pink.  Oropharynx clear.  No  mucositis or candidiasis.   Nodes:  No cervical, supraclavicular, or axillary lymphadenopathy palpated.  Breast Exam:  Deferred.  Lungs:  Clear to auscultation bilaterally.  No crackles, rhonchi, or wheezes.   Heart:  Regular rate and rhythm.   Abdomen:  Soft, nontender.  Positive bowel sounds.  No organomegaly or masses palpated.   Musculoskeletal:  No focal spinal tenderness to palpation.  Extremities:  Benign.  No peripheral edema or cyanosis.   Skin:  Benign, no PPE changes. Neuro:  Nonfocal, alert and oriented x 3.   Lab Results: Lab Results  Component Value Date   WBC 15.4* 06/16/2011   HGB 11.7 06/16/2011   HCT 34.3* 06/16/2011   MCV 88.2 06/16/2011   PLT 163 06/16/2011   NEUTROABS 12.2* 06/16/2011     Chemistry      Component Value Date/Time   NA 137 05/12/2011 0818   K 3.4* 05/12/2011 0818   CL 101 05/12/2011 0818   CO2 29 05/12/2011 0818   BUN 15 05/12/2011 0818   CREATININE 0.65 05/12/2011 0818      Component Value Date/Time   CALCIUM 9.6 05/12/2011 0818   ALKPHOS 92 05/12/2011 0818   AST 22 05/12/2011 0818   ALT 20 05/12/2011 0818   BILITOT 0.4 05/12/2011 0818      Lab Results  Component Value Date   LABCA2 25 05/12/2011     Assessment:  Kirsten Gibson is a 66 year old [place] woman with newly diagnosed bilateral breast carcinoma now being treated in the neoadjuvant setting, due for week 3 cycle 1/6 q3week TCH, with weekly Herceptin and Neulasta support on day 2.   -Infiltrating ductal carcinoma of the right breast, ER/PR/HER-2 positive with positive axillary lymph node involvement   -Infiltrating ductal carcinoma the left breast, ER/PR positive HER-2 negative.  Case to be reviewed with Dr. Pierce Crane.  Plan:  Kirsten Gibson receive week 3 single agent Herceptin today as scheduled. I will not make any adjustments in her premeds. I will see her in one week's time with labs prior, for assessment in anticipation of week 1 cycle 2/6 every three week [facility] which will be combined  therapy, to include a breast exam. She and her husband know that they can feel free to contact us prior if the need should arise.  This plan was reviewed with the patient, who voices understanding and agreement.  She knows to call with any changes or problems.    Shera Laubach T, PA-C 06/16/2011

## 2011-06-18 NOTE — Telephone Encounter (Signed)
Patient doesn't have an upcoming surgery scheduled. When she has surgery, she will come for a preop visit and everything will be discussed at the visit.

## 2011-06-23 ENCOUNTER — Other Ambulatory Visit (HOSPITAL_BASED_OUTPATIENT_CLINIC_OR_DEPARTMENT_OTHER): Payer: Federal, State, Local not specified - PPO | Admitting: Lab

## 2011-06-23 ENCOUNTER — Other Ambulatory Visit: Payer: Self-pay | Admitting: Physician Assistant

## 2011-06-23 ENCOUNTER — Encounter: Payer: Self-pay | Admitting: Physician Assistant

## 2011-06-23 ENCOUNTER — Other Ambulatory Visit: Payer: Federal, State, Local not specified - PPO | Admitting: Lab

## 2011-06-23 ENCOUNTER — Ambulatory Visit (HOSPITAL_BASED_OUTPATIENT_CLINIC_OR_DEPARTMENT_OTHER): Payer: Federal, State, Local not specified - PPO

## 2011-06-23 ENCOUNTER — Ambulatory Visit (HOSPITAL_BASED_OUTPATIENT_CLINIC_OR_DEPARTMENT_OTHER): Payer: Federal, State, Local not specified - PPO | Admitting: Physician Assistant

## 2011-06-23 VITALS — BP 155/84 | HR 73 | Temp 98.2°F | Ht 60.0 in | Wt 135.2 lb

## 2011-06-23 DIAGNOSIS — C773 Secondary and unspecified malignant neoplasm of axilla and upper limb lymph nodes: Secondary | ICD-10-CM

## 2011-06-23 DIAGNOSIS — C50919 Malignant neoplasm of unspecified site of unspecified female breast: Secondary | ICD-10-CM

## 2011-06-23 DIAGNOSIS — Z170 Estrogen receptor positive status [ER+]: Secondary | ICD-10-CM

## 2011-06-23 DIAGNOSIS — C50119 Malignant neoplasm of central portion of unspecified female breast: Secondary | ICD-10-CM

## 2011-06-23 DIAGNOSIS — C50912 Malignant neoplasm of unspecified site of left female breast: Secondary | ICD-10-CM

## 2011-06-23 DIAGNOSIS — C50419 Malignant neoplasm of upper-outer quadrant of unspecified female breast: Secondary | ICD-10-CM

## 2011-06-23 LAB — COMPREHENSIVE METABOLIC PANEL
ALT: 20 U/L (ref 0–35)
AST: 21 U/L (ref 0–37)
BUN: 19 mg/dL (ref 6–23)
CO2: 21 mEq/L (ref 19–32)
Creatinine, Ser: 0.47 mg/dL — ABNORMAL LOW (ref 0.50–1.10)
Total Bilirubin: 0.2 mg/dL — ABNORMAL LOW (ref 0.3–1.2)

## 2011-06-23 LAB — CBC WITH DIFFERENTIAL/PLATELET
BASO%: 0.1 % (ref 0.0–2.0)
LYMPH%: 8.9 % — ABNORMAL LOW (ref 14.0–49.7)
MCH: 30.1 pg (ref 25.1–34.0)
MCHC: 33.9 g/dL (ref 31.5–36.0)
MCV: 88.8 fL (ref 79.5–101.0)
MONO%: 5.5 % (ref 0.0–14.0)
Platelets: 277 10*3/uL (ref 145–400)
RBC: 3.75 10*6/uL (ref 3.70–5.45)
nRBC: 0 % (ref 0–0)

## 2011-06-23 LAB — LACTATE DEHYDROGENASE: LDH: 362 U/L — ABNORMAL HIGH (ref 94–250)

## 2011-06-23 MED ORDER — DOCETAXEL CHEMO INJECTION 160 MG/16ML
75.0000 mg/m2 | Freq: Once | INTRAVENOUS | Status: DC
  Filled 2011-06-23: qty 12

## 2011-06-23 MED ORDER — SODIUM CHLORIDE 0.9 % IV SOLN
Freq: Once | INTRAVENOUS | Status: AC
  Administered 2011-06-23: 11:00:00 via INTRAVENOUS

## 2011-06-23 MED ORDER — DEXAMETHASONE SODIUM PHOSPHATE 4 MG/ML IJ SOLN
20.0000 mg | Freq: Once | INTRAMUSCULAR | Status: AC
  Administered 2011-06-23: 20 mg via INTRAVENOUS

## 2011-06-23 MED ORDER — ACETAMINOPHEN 325 MG PO TABS
650.0000 mg | ORAL_TABLET | Freq: Once | ORAL | Status: AC
  Administered 2011-06-23: 650 mg via ORAL

## 2011-06-23 MED ORDER — SODIUM CHLORIDE 0.9 % IV SOLN: Freq: Once | INTRAVENOUS | Status: DC

## 2011-06-23 MED ORDER — ALPRAZOLAM 0.25 MG PO TABS: 0.2500 mg | ORAL_TABLET | Freq: Every evening | ORAL | Status: DC | PRN

## 2011-06-23 MED ORDER — HEPARIN SOD (PORK) LOCK FLUSH 100 UNIT/ML IV SOLN
500.0000 [IU] | Freq: Once | INTRAVENOUS | Status: DC | PRN
  Filled 2011-06-23: qty 5

## 2011-06-23 MED ORDER — SODIUM CHLORIDE 0.9 % IV SOLN
670.0000 mg | Freq: Once | INTRAVENOUS | Status: AC
  Administered 2011-06-23: 670 mg via INTRAVENOUS
  Filled 2011-06-23: qty 67

## 2011-06-23 MED ORDER — DIPHENHYDRAMINE HCL 25 MG PO CAPS
50.0000 mg | ORAL_CAPSULE | Freq: Once | ORAL | Status: AC
  Administered 2011-06-23: 50 mg via ORAL

## 2011-06-23 MED ORDER — ONDANSETRON 16 MG/50ML IVPB (CHCC)
16.0000 mg | Freq: Once | INTRAVENOUS | Status: AC
  Administered 2011-06-23: 16 mg via INTRAVENOUS

## 2011-06-23 MED ORDER — TRASTUZUMAB CHEMO INJECTION 440 MG
2.0000 mg/kg | Freq: Once | INTRAVENOUS | Status: AC
  Administered 2011-06-23: 126 mg via INTRAVENOUS
  Filled 2011-06-23: qty 6

## 2011-06-23 MED ORDER — SODIUM CHLORIDE 0.9 % IJ SOLN
10.0000 mL | INTRAMUSCULAR | Status: DC | PRN
  Filled 2011-06-23: qty 10

## 2011-06-23 NOTE — Progress Notes (Signed)
Hematology and Oncology Follow Up Visit  Kirsten Gibson [id number] 03-07-1945 66 y.o. 06/23/2011    HPI: Kirsten Gibson is a 66 year old [place] woman with newly diagnosed bilateral breast carcinoma now being treated in the neoadjuvant setting, due for week 1 cycle 2/6 q3week TCH, with weekly Herceptin and Neulasta support on day 2.   -Infiltrating ductal carcinoma of the right breast, ER/PR/HER-2 positive with positive axillary lymph node involvement   -Infiltrating ductal carcinoma the left breast, ER/PR positive HER-2 negative.  Interim History:   Kirsten Gibson is seen today with her husband in accompaniment in anticipation of week 1 cycle 2/6 planned every 3 week doses of neoadjuvant TCH, which will be combined therapy. Overall, she is actually feeling pretty well denying any unexplained fevers, chills, or night sweats. She's continuing to have no difficulty with nausea. Prior constipation issues have resolved. She had experienced profound fatigue but this has improved.  She has not noted any skin changes, no neuropathy symptoms, nailbed sensitivity, or excessive eye tearing.  Of note, she is scheduled to see Dr. Lenis Noon at [facility] on 06/25/11 for second opinion. A detailed review of systems is otherwise noncontributory as noted below.  Review of Systems: Constitutional:  no weight loss, fever, night sweats and fatigued Eyes: uses glasses ENT: no complaints Cardiovascular: no chest pain or dyspnea on exertion Respiratory: no cough, shortness of breath, or wheezing Neurological: no TIA or stroke symptoms Dermatological: History of mycosis fungoides. Gastrointestinal: no abdominal pain, change in bowel habits, or black or bloody stools Genito-Urinary: no dysuria, trouble voiding, or hematuria Hematological and Lymphatic: negative Breast: known bilateral breast cancer Musculoskeletal: negative Remaining ROS negative.   Medications:   I have reviewed the patient's current  medications.  Current Outpatient Prescriptions  Medication Sig Dispense Refill  . ALPRAZolam (XANAX) 0.25 MG tablet Take 0.25 mg by mouth at bedtime as needed.       . calcium carbonate (OS-CAL) 600 MG TABS Take 600 mg by mouth 3 (three) times daily with meals.      . Cholecalciferol (VITAMIN D3 PO) Take 1,200 mg by mouth daily.      [name] dexamethasone (DECADRON) 4 MG tablet Take 2 tablets two times a day the day before Taxotere. Then take 2 tabs two times a day starting the day after chemo for 3 days.  30 tablet  1  . lidocaine-prilocaine (EMLA) cream APPLY TO PORT A CATH AS DIRECTED BEFORE CHEMO TREATMENT.  30 g  3  . ondansetron (ZOFRAN) 8 MG tablet Take 1 tablet two times a day starting the day after chemo for 3 days. Then take 1 tab two times a day as needed for nausea or vomiting.  30 tablet  1  . prochlorperazine (COMPAZINE) 10 MG tablet Take 1 tablet (10 mg total) by mouth every 6 (six) hours as needed (Nausea or vomiting).  30 tablet  1  . prochlorperazine (COMPAZINE) 25 MG suppository Place 1 suppository (25 mg total) rectally every 12 (twelve) hours as needed for nausea.  12 suppository  3   No current facility-administered medications for this visit.   Facility-Administered Medications Ordered in Other Visits  Medication Dose Route Frequency Provider Last Rate Last Dose  . lidocaine-prilocaine (EMLA) cream   Topical Once Pierce Crane, MD        Allergies:  Allergies  Allergen Reactions  . Latex Dermatitis and Rash  . Adhesive (Tape)     ALLERGIC TO OP-SITE......USE OP-SITE FLEXIGRID INSTEAD !!!  . Diclofenac Other (  See Comments)    Fever, chills  . Naproxen Other (See Comments)    Fever, chills    Physical Exam: Filed Vitals:   06/23/11 0929  BP: 155/84  Pulse: 73  Temp: 98.2 F (36.8 C)    Body mass index is 26.40 kg/(m^2). Weight: 135 lbs. HEENT:  Sclerae anicteric, conjunctivae pink.  Oropharynx clear.  No mucositis or candidiasis.   Nodes:  No cervical,  supraclavicular, or axillary lymphadenopathy palpated.  Breast Exam: The right breast was examined, the mass in the upper-outer quadrant is about the size of a cherry tomato i.e. about 3 cm with a bit of a thickened tail beneath. No frank evidence of palpable lymphadenopathy. The left breast was examined, there is some thickening in the mid upper-outer quadrant, but no definable mass. No palpable lymphadenopathy.  Lungs:  Clear to auscultation bilaterally.  No crackles, rhonchi, or wheezes.   Heart:  Regular rate and rhythm.   Abdomen:  Soft, nontender.  Positive bowel sounds.  No organomegaly or masses palpated.   Musculoskeletal:  No focal spinal tenderness to palpation.  Extremities:  Benign.  No peripheral edema or cyanosis.   Skin:  Benign, no PPE changes. Neuro:  Nonfocal, alert and oriented x 3.   Lab Results: Lab Results  Component Value Date   WBC 12.4* 06/23/2011   HGB 11.3* 06/23/2011   HCT 33.3* 06/23/2011   MCV 88.8 06/23/2011   PLT 277 06/23/2011   NEUTROABS 10.6* 06/23/2011     Chemistry      Component Value Date/Time   NA 137 05/12/2011 0818   K 3.4* 05/12/2011 0818   CL 101 05/12/2011 0818   CO2 29 05/12/2011 0818   BUN 15 05/12/2011 0818   CREATININE 0.65 05/12/2011 0818      Component Value Date/Time   CALCIUM 9.6 05/12/2011 0818   ALKPHOS 92 05/12/2011 0818   AST 22 05/12/2011 0818   ALT 20 05/12/2011 0818   BILITOT 0.4 05/12/2011 0818      Lab Results  Component Value Date   LABCA2 25 05/12/2011     Assessment:  Kirsten Gibson is a 66 year old [place] woman with newly diagnosed bilateral breast carcinoma now being treated in the neoadjuvant setting, due for week 1 cycle 2/6 q3week TCH, with weekly Herceptin and Neulasta support on day 2.   -Infiltrating ductal carcinoma of the right breast, ER/PR/HER-2 positive with positive axillary lymph node involvement   -Infiltrating ductal carcinoma the left breast, ER/PR positive HER-2 negative.  Case to be reviewed  with Dr. Pierce Crane.  Plan:  Kirsten Gibson receive week 1, cycle 2/6 planned neoadjuvant TCH with weekly Herceptin. She will return on day 2 for Neulasta support.  I will see her in one week's time with labs prior, for assessment in anticipation of week 2 cycle 2/6 every three week [facility] which will be single agent Herceptin. She and her husband know that they can feel free to contact us prior if the need should arise.  This plan was reviewed with the patient, who voices understanding and agreement.  She knows to call with any changes or problems.    Dantavious Snowball T, PA-C 06/23/2011

## 2011-06-23 NOTE — Patient Instructions (Signed)
[facility] Discharge Instructions for Patients Receiving Chemotherapy  Today you received the following chemotherapy agents taxotere, carboplatin, herceptin To help prevent nausea and vomiting after your treatment, we encourage you to take your nausea medication  and take it as often as prescribed for the next If you develop nausea and vomiting that is not controlled by your nausea medication, call the clinic. If it is after clinic hours your family physician or the after hours number for the clinic or go to the Emergency Department.   BELOW ARE SYMPTOMS THAT SHOULD BE REPORTED IMMEDIATELY:  *FEVER GREATER THAN 100.5 F  *CHILLS WITH OR WITHOUT FEVER  NAUSEA AND VOMITING THAT IS NOT CONTROLLED WITH YOUR NAUSEA MEDICATION  *UNUSUAL SHORTNESS OF BREATH  *UNUSUAL BRUISING OR BLEEDING  TENDERNESS IN MOUTH AND THROAT WITH OR WITHOUT PRESENCE OF ULCERS  *URINARY PROBLEMS  *BOWEL PROBLEMS  UNUSUAL RASH Items with * indicate a potential emergency and should be followed up as soon as possible.  One of the nurses will contact you 24 hours after your treatment. Please let the nurse know about any problems that you may have experienced. Feel free to call the clinic you have any questions or concerns. The clinic phone number is [phone].   I have been informed and understand all the instructions given to me. I know to contact the clinic, my physician, or go to the Emergency Department if any problems should occur. I do not have any questions at this time, but understand that I may call the clinic during office hours   should I have any questions or need assistance in obtaining follow up care.    __________________________________________  _____________  __________ Signature of Patient or Authorized Representative            Date                   Time    __________________________________________ Nurse's Signature

## 2011-06-24 ENCOUNTER — Ambulatory Visit (HOSPITAL_BASED_OUTPATIENT_CLINIC_OR_DEPARTMENT_OTHER): Payer: Federal, State, Local not specified - PPO

## 2011-06-24 VITALS — BP 158/79 | HR 69 | Temp 97.7°F

## 2011-06-24 DIAGNOSIS — C50419 Malignant neoplasm of upper-outer quadrant of unspecified female breast: Secondary | ICD-10-CM

## 2011-06-24 DIAGNOSIS — Z5189 Encounter for other specified aftercare: Secondary | ICD-10-CM

## 2011-06-24 DIAGNOSIS — C773 Secondary and unspecified malignant neoplasm of axilla and upper limb lymph nodes: Secondary | ICD-10-CM

## 2011-06-24 DIAGNOSIS — C50912 Malignant neoplasm of unspecified site of left female breast: Secondary | ICD-10-CM

## 2011-06-24 MED ORDER — PEGFILGRASTIM INJECTION 6 MG/0.6ML
6.0000 mg | Freq: Once | SUBCUTANEOUS | Status: AC
  Administered 2011-06-24: 6 mg via SUBCUTANEOUS
  Filled 2011-06-24: qty 0.6

## 2011-06-24 NOTE — Patient Instructions (Signed)
Call MD for problems 

## 2011-06-29 ENCOUNTER — Encounter: Payer: Self-pay | Admitting: Oncology

## 2011-06-29 NOTE — Progress Notes (Signed)
Today patients husband came in Kirsten Gibson inquiring about getting a reimbursement for neulasta payment and getting some assistance. Signed patient up with [employer] First Step program. Patient is enrolled. Gave patient the card as instructed but found out that I needed to keep the card, will retrieve card from patients husband on next visit.  Program number [id number]

## 2011-06-30 ENCOUNTER — Other Ambulatory Visit (HOSPITAL_BASED_OUTPATIENT_CLINIC_OR_DEPARTMENT_OTHER): Payer: Federal, State, Local not specified - PPO | Admitting: Lab

## 2011-06-30 ENCOUNTER — Ambulatory Visit (HOSPITAL_BASED_OUTPATIENT_CLINIC_OR_DEPARTMENT_OTHER): Payer: Federal, State, Local not specified - PPO

## 2011-06-30 ENCOUNTER — Encounter (INDEPENDENT_AMBULATORY_CARE_PROVIDER_SITE_OTHER): Payer: Self-pay

## 2011-06-30 ENCOUNTER — Encounter: Payer: Self-pay | Admitting: Physician Assistant

## 2011-06-30 ENCOUNTER — Ambulatory Visit (HOSPITAL_BASED_OUTPATIENT_CLINIC_OR_DEPARTMENT_OTHER): Payer: Federal, State, Local not specified - PPO | Admitting: Physician Assistant

## 2011-06-30 VITALS — BP 144/88 | HR 87 | Temp 97.8°F | Ht 60.0 in | Wt 129.2 lb

## 2011-06-30 DIAGNOSIS — Z5112 Encounter for antineoplastic immunotherapy: Secondary | ICD-10-CM

## 2011-06-30 DIAGNOSIS — Z170 Estrogen receptor positive status [ER+]: Secondary | ICD-10-CM

## 2011-06-30 DIAGNOSIS — C773 Secondary and unspecified malignant neoplasm of axilla and upper limb lymph nodes: Secondary | ICD-10-CM

## 2011-06-30 DIAGNOSIS — C50419 Malignant neoplasm of upper-outer quadrant of unspecified female breast: Secondary | ICD-10-CM

## 2011-06-30 DIAGNOSIS — C50912 Malignant neoplasm of unspecified site of left female breast: Secondary | ICD-10-CM

## 2011-06-30 DIAGNOSIS — C50119 Malignant neoplasm of central portion of unspecified female breast: Secondary | ICD-10-CM

## 2011-06-30 DIAGNOSIS — C50919 Malignant neoplasm of unspecified site of unspecified female breast: Secondary | ICD-10-CM

## 2011-06-30 LAB — CBC WITH DIFFERENTIAL/PLATELET
BASO%: 0.5 % (ref 0.0–2.0)
Basophils Absolute: 0.1 10e3/uL (ref 0.0–0.1)
EOS%: 0.4 % (ref 0.0–7.0)
Eosinophils Absolute: 0 10e3/uL (ref 0.0–0.5)
HCT: 34.5 % — ABNORMAL LOW (ref 34.8–46.6)
HGB: 11.9 g/dL (ref 11.6–15.9)
LYMPH%: 19.5 % (ref 14.0–49.7)
MCH: 30.3 pg (ref 25.1–34.0)
MCHC: 34.5 g/dL (ref 31.5–36.0)
MCV: 87.8 fL (ref 79.5–101.0)
MONO#: 2.5 10e3/uL — ABNORMAL HIGH (ref 0.1–0.9)
MONO%: 22.9 % — ABNORMAL HIGH (ref 0.0–14.0)
NEUT#: 6.3 10e3/uL (ref 1.5–6.5)
NEUT%: 56.7 % (ref 38.4–76.8)
Platelets: 235 10e3/uL (ref 145–400)
RBC: 3.93 10e6/uL (ref 3.70–5.45)
RDW: 12.9 % (ref 11.2–14.5)
WBC: 11.1 10e3/uL — ABNORMAL HIGH (ref 3.9–10.3)
lymph#: 2.2 10e3/uL (ref 0.9–3.3)
nRBC: 0 % (ref 0–0)

## 2011-06-30 MED ORDER — TRASTUZUMAB CHEMO INJECTION 440 MG
2.0000 mg/kg | Freq: Once | INTRAVENOUS | Status: AC
  Administered 2011-06-30: 126 mg via INTRAVENOUS
  Filled 2011-06-30: qty 6

## 2011-06-30 MED ORDER — SODIUM CHLORIDE 0.9 % IJ SOLN
10.0000 mL | INTRAMUSCULAR | Status: DC | PRN
  Administered 2011-06-30: 10 mL
  Filled 2011-06-30: qty 10

## 2011-06-30 MED ORDER — DIPHENHYDRAMINE HCL 25 MG PO CAPS
50.0000 mg | ORAL_CAPSULE | Freq: Once | ORAL | Status: AC
  Administered 2011-06-30: 50 mg via ORAL

## 2011-06-30 MED ORDER — SODIUM CHLORIDE 0.9 % IV SOLN
Freq: Once | INTRAVENOUS | Status: AC
  Administered 2011-06-30: 13:00:00 via INTRAVENOUS

## 2011-06-30 MED ORDER — HEPARIN SOD (PORK) LOCK FLUSH 100 UNIT/ML IV SOLN
500.0000 [IU] | Freq: Once | INTRAVENOUS | Status: AC | PRN
  Administered 2011-06-30: 500 [IU]
  Filled 2011-06-30: qty 5

## 2011-06-30 MED ORDER — ACETAMINOPHEN 325 MG PO TABS
650.0000 mg | ORAL_TABLET | Freq: Once | ORAL | Status: AC
  Administered 2011-06-30: 650 mg via ORAL

## 2011-06-30 NOTE — Patient Instructions (Signed)
[facility] Discharge Instructions for Patients Receiving Chemotherapy  Today you received the following chemotherapy agents Herceptin To help prevent nausea and vomiting after your treatment, we encourage you to take your nausea medication as prescribed.  If you develop nausea and vomiting that is not controlled by your nausea medication, call the clinic. If it is after clinic hours your family physician or the after hours number for the clinic or go to the Emergency Department.   BELOW ARE SYMPTOMS THAT SHOULD BE REPORTED IMMEDIATELY:  *FEVER GREATER THAN 100.5 F  *CHILLS WITH OR WITHOUT FEVER  NAUSEA AND VOMITING THAT IS NOT CONTROLLED WITH YOUR NAUSEA MEDICATION  *UNUSUAL SHORTNESS OF BREATH  *UNUSUAL BRUISING OR BLEEDING  TENDERNESS IN MOUTH AND THROAT WITH OR WITHOUT PRESENCE OF ULCERS  *URINARY PROBLEMS  *BOWEL PROBLEMS  UNUSUAL RASH Items with * indicate a potential emergency and should be followed up as soon as possible.  One of the nurses will contact you 24 hours after your treatment. Please let the nurse know about any problems that you may have experienced. Feel free to call the clinic you have any questions or concerns. The clinic phone number is [phone].   I have been informed and understand all the instructions given to me. I know to contact the clinic, my physician, or go to the Emergency Department if any problems should occur. I do not have any questions at this time, but understand that I may call the clinic during office hours   should I have any questions or need assistance in obtaining follow up care.    __________________________________________  _____________  __________ Signature of Patient or Authorized Representative            Date                   Time    __________________________________________ Nurse's Signature    

## 2011-07-01 ENCOUNTER — Encounter: Payer: Self-pay | Admitting: Oncology

## 2011-07-01 ENCOUNTER — Telehealth: Payer: Self-pay | Admitting: Oncology

## 2011-07-01 NOTE — Progress Notes (Signed)
Hematology and Oncology Follow Up Visit  Kirsten Gibson [id number] [birth year] 66 y.o. 06/30/11    HPI: Kirsten Gibson is a 66 year old [place] woman with newly diagnosed bilateral breast carcinoma now being treated in the neoadjuvant setting, due for week 2 cycle 2/6 q3week TCH, with weekly Herceptin and Neulasta support on day 2.   -Infiltrating ductal carcinoma of the right breast, ER/PR/HER-2 positive with positive axillary lymph node involvement   -Infiltrating ductal carcinoma the left breast, ER/PR positive HER-2 negative.  Interim History:   Kirsten Gibson is seen today with her husband in accompaniment in anticipation of week 2 cycle 2/6 planned every 3 week doses of neoadjuvant TCH, which will be single agent Hercaptin. Overall, she is actually feeling pretty well denying any unexplained fevers, chills, or night sweats. She's continuing to have no difficulty with nausea. Prior constipation issues have resolved. She had experienced profound fatigue but this has improved.  She has not noted any skin changes, no neuropathy symptoms, nailbed sensitivity, or excessive eye tearing.   A detailed review of systems is otherwise noncontributory as noted below.  Review of Systems: Constitutional:  no weight loss, fever, night sweats and fatigued Eyes: uses glasses ENT: no complaints Cardiovascular: no chest pain or dyspnea on exertion Respiratory: no cough, shortness of breath, or wheezing Neurological: no TIA or stroke symptoms Dermatological: History of mycosis fungoides. Gastrointestinal: no abdominal pain, change in bowel habits, or black or bloody stools Genito-Urinary: no dysuria, trouble voiding, or hematuria Hematological and Lymphatic: negative Breast: known bilateral breast cancer Musculoskeletal: negative Remaining ROS negative.   Medications:   I have reviewed the patient's current medications.  Current Outpatient Prescriptions  Medication Sig Dispense Refill    . ALPRAZolam (XANAX) 0.25 MG tablet Take 1 tablet (0.25 mg total) by mouth at bedtime as needed.  60 tablet  1  . calcium carbonate (OS-CAL) 600 MG TABS Take 600 mg by mouth 3 (three) times daily with meals.      . Cholecalciferol (VITAMIN D3 PO) Take 1,200 mg by mouth daily.      [name] dexamethasone (DECADRON) 4 MG tablet Take 2 tablets two times a day the day before Taxotere. Then take 2 tabs two times a day starting the day after chemo for 3 days.  30 tablet  1  . lidocaine-prilocaine (EMLA) cream APPLY TO PORT A CATH AS DIRECTED BEFORE CHEMO TREATMENT.  30 g  3  . ondansetron (ZOFRAN) 8 MG tablet Take 1 tablet two times a day starting the day after chemo for 3 days. Then take 1 tab two times a day as needed for nausea or vomiting.  30 tablet  1  . prochlorperazine (COMPAZINE) 10 MG tablet Take 1 tablet (10 mg total) by mouth every 6 (six) hours as needed (Nausea or vomiting).  30 tablet  1  . prochlorperazine (COMPAZINE) 25 MG suppository Place 1 suppository (25 mg total) rectally every 12 (twelve) hours as needed for nausea.  12 suppository  3   No current facility-administered medications for this visit.   Facility-Administered Medications Ordered in Other Visits  Medication Dose Route Frequency Provider Last Rate Last Dose  . 0.9 %  sodium chloride infusion   Intravenous Once Amada Kingfisher, PA      . acetaminophen (TYLENOL) tablet 650 mg  650 mg Oral Once Amada Kingfisher, PA   650 mg at 06/30/11 1251  . diphenhydrAMINE (BENADRYL) capsule 50 mg  50 mg Oral Once Amada Kingfisher, [place]  50 mg at 06/30/11 1251  . heparin lock flush 100 unit/mL  500 Units Intracatheter Once PRN Amada Kingfisher, PA   500 Units at 06/30/11 1348  . lidocaine-prilocaine (EMLA) cream   Topical Once Pierce Crane, MD      . trastuzumab (HERCEPTIN) 126 mg in sodium chloride 0.9 % 250 mL chemo infusion  2 mg/kg (Treatment Plan Actual) Intravenous Once Amada Kingfisher, PA   126 mg at 06/30/11 1312  .  DISCONTD: sodium chloride 0.9 % injection 10 mL  10 mL Intracatheter PRN Amada Kingfisher, PA   10 mL at 06/30/11 1348    Allergies:  Allergies  Allergen Reactions  . Latex Dermatitis and Rash  . Adhesive (Tape)     ALLERGIC TO OP-SITE......USE OP-SITE FLEXIGRID INSTEAD !!!  . Diclofenac Other (See Comments)    Fever, chills  . Naproxen Other (See Comments)    Fever, chills    Physical Exam: Filed Vitals:   06/30/11 1041  BP: 144/88  Pulse: 87  Temp: 97.8 F (36.6 C)    Body mass index is 25.23 kg/(m^2). Weight: 129 lbs. HEENT:  Sclerae anicteric, conjunctivae pink.  Oropharynx clear.  No mucositis or candidiasis.   Nodes:  No cervical, supraclavicular, or axillary lymphadenopathy palpated.  Breast Exam: deferred. Lungs:  Clear to auscultation bilaterally.  No crackles, rhonchi, or wheezes.   Heart:  Regular rate and rhythm.   Abdomen:  Soft, nontender.  Positive bowel sounds.  No organomegaly or masses palpated.   Musculoskeletal:  No focal spinal tenderness to palpation.  Extremities:  Benign.  No peripheral edema or cyanosis.   Skin:  Benign, no PPE changes. Neuro:  Nonfocal, alert and oriented x 3.   Lab Results: Lab Results  Component Value Date   WBC 11.1* 06/30/2011   HGB 11.9 06/30/2011   HCT 34.5* 06/30/2011   MCV 87.8 06/30/2011   PLT 235 06/30/2011   NEUTROABS 6.3 06/30/2011     Chemistry      Component Value Date/Time   NA 141 06/23/2011 0910   K 3.5 06/23/2011 0910   CL 111 06/23/2011 0910   CO2 21 06/23/2011 0910   BUN 19 06/23/2011 0910   CREATININE 0.47* 06/23/2011 0910      Component Value Date/Time   CALCIUM 7.6* 06/23/2011 0910   ALKPHOS 68 06/23/2011 0910   AST 21 06/23/2011 0910   ALT 20 06/23/2011 0910   BILITOT 0.2* 06/23/2011 0910      Lab Results  Component Value Date   LABCA2 25 05/12/2011     Assessment:  Kirsten Gibson is a 66 year old [place] woman with newly diagnosed bilateral breast carcinoma now being treated in  the neoadjuvant setting, due for week 2 cycle 2/6 q3week TCH, with weekly Herceptin and Neulasta support on day 2.   -Infiltrating ductal carcinoma of the right breast, ER/PR/HER-2 positive with positive axillary lymph node involvement   -Infiltrating ductal carcinoma the left breast, ER/PR positive HER-2 negative.  Case reviewed with Dr. Pierce Crane.  Plan:  Kirsten Gibson receive week 2, cycle 2/6 planned neoadjuvant [facility] which is weekly Herceptin.  She will return in one week's time with labs prior, for  week 3 cycle 2/6 every three week [facility] which will be single agent Herceptin.  On 07/13/11, she will receive week 1 cycle 3 of TCH, follow up on 07/19/11 prior to week 2 cycle 3. She and her husband know that they can feel free to contact us prior if  the need should arise.  This plan was reviewed with the patient, who voices understanding and agreement.  She knows to call with any changes or problems.    Ronica Vivian T, PA-C 06/30/11

## 2011-07-01 NOTE — Telephone Encounter (Signed)
Spoke with patient again, let Kirsten Gibson know that I did talk with Kirsten Gibson and Kirsten Gibson, she will be sending letter in to [place]. Patient felt better about our conversation. I also gave patient information for co pay assistance for herceptin she was grateful for that, but stilll didn't understand as to why she wasn't told about the assistance when she started the shots. But she stated she was grateful for the help I was giving her at this time.

## 2011-07-01 NOTE — Progress Notes (Signed)
Spoke with patient and husband today, patient was very upset, patient stated she should have been told about the neulasta first step program when she was notified about taking the shot. Patient stated that she has over a 2000.00 bill and feels we are responsible because she was not notified about the program. I asked Kirsten Gibson if I could do some research and get with my supervisor and call her back. She was very grateful that I was going to research. I did sign patient up for program for the future shots.

## 2011-07-07 ENCOUNTER — Other Ambulatory Visit: Payer: Self-pay | Admitting: Oncology

## 2011-07-07 ENCOUNTER — Ambulatory Visit (HOSPITAL_BASED_OUTPATIENT_CLINIC_OR_DEPARTMENT_OTHER): Payer: Federal, State, Local not specified - PPO

## 2011-07-07 ENCOUNTER — Other Ambulatory Visit (HOSPITAL_BASED_OUTPATIENT_CLINIC_OR_DEPARTMENT_OTHER): Payer: Federal, State, Local not specified - PPO | Admitting: Lab

## 2011-07-07 VITALS — BP 134/69 | HR 61 | Temp 98.0°F

## 2011-07-07 DIAGNOSIS — C50419 Malignant neoplasm of upper-outer quadrant of unspecified female breast: Secondary | ICD-10-CM

## 2011-07-07 DIAGNOSIS — Z5112 Encounter for antineoplastic immunotherapy: Secondary | ICD-10-CM

## 2011-07-07 DIAGNOSIS — C50912 Malignant neoplasm of unspecified site of left female breast: Secondary | ICD-10-CM

## 2011-07-07 LAB — CBC WITH DIFFERENTIAL/PLATELET
Basophils Absolute: 0.1 10*3/uL (ref 0.0–0.1)
Eosinophils Absolute: 0 10*3/uL (ref 0.0–0.5)
HGB: 10.2 g/dL — ABNORMAL LOW (ref 11.6–15.9)
MCV: 88.8 fL (ref 79.5–101.0)
MONO%: 7.3 % (ref 0.0–14.0)
NEUT#: 7.2 10*3/uL — ABNORMAL HIGH (ref 1.5–6.5)
RBC: 3.38 10*6/uL — ABNORMAL LOW (ref 3.70–5.45)
RDW: 13.2 % (ref 11.2–14.5)
WBC: 10.3 10*3/uL (ref 3.9–10.3)
lymph#: 2.3 10*3/uL (ref 0.9–3.3)

## 2011-07-07 LAB — COMPREHENSIVE METABOLIC PANEL
AST: 29 U/L (ref 0–37)
Albumin: 3.9 g/dL (ref 3.5–5.2)
Alkaline Phosphatase: 97 U/L (ref 39–117)
Calcium: 9 mg/dL (ref 8.4–10.5)
Chloride: 104 mEq/L (ref 96–112)
Glucose, Bld: 87 mg/dL (ref 70–99)
Potassium: 3.7 mEq/L (ref 3.5–5.3)
Sodium: 139 mEq/L (ref 135–145)
Total Protein: 5.7 g/dL — ABNORMAL LOW (ref 6.0–8.3)

## 2011-07-07 MED ORDER — DIPHENHYDRAMINE HCL 25 MG PO CAPS
50.0000 mg | ORAL_CAPSULE | Freq: Once | ORAL | Status: AC
  Administered 2011-07-07: 50 mg via ORAL

## 2011-07-07 MED ORDER — SODIUM CHLORIDE 0.9 % IJ SOLN
10.0000 mL | INTRAMUSCULAR | Status: DC | PRN
  Administered 2011-07-07: 10 mL
  Filled 2011-07-07: qty 10

## 2011-07-07 MED ORDER — ACETAMINOPHEN 325 MG PO TABS
650.0000 mg | ORAL_TABLET | Freq: Once | ORAL | Status: AC
  Administered 2011-07-07: 650 mg via ORAL

## 2011-07-07 MED ORDER — TRASTUZUMAB CHEMO INJECTION 440 MG
2.0000 mg/kg | Freq: Once | INTRAVENOUS | Status: AC
  Administered 2011-07-07: 126 mg via INTRAVENOUS
  Filled 2011-07-07: qty 6

## 2011-07-07 MED ORDER — SODIUM CHLORIDE 0.9 % IV SOLN
Freq: Once | INTRAVENOUS | Status: AC
  Administered 2011-07-07: 12:00:00 via INTRAVENOUS

## 2011-07-07 MED ORDER — HEPARIN SOD (PORK) LOCK FLUSH 100 UNIT/ML IV SOLN
500.0000 [IU] | Freq: Once | INTRAVENOUS | Status: AC | PRN
  Administered 2011-07-07: 500 [IU]
  Filled 2011-07-07: qty 5

## 2011-07-07 NOTE — Patient Instructions (Signed)
[facility] Discharge Instructions for Patients Receiving Chemotherapy  Today you received the following chemotherapy agents Herceptin To help prevent nausea and vomiting after your treatment, we encourage you to take your nausea medication as prescribed.  If you develop nausea and vomiting that is not controlled by your nausea medication, call the clinic. If it is after clinic hours your family physician or the after hours number for the clinic or go to the Emergency Department.   BELOW ARE SYMPTOMS THAT SHOULD BE REPORTED IMMEDIATELY:  *FEVER GREATER THAN 100.5 F  *CHILLS WITH OR WITHOUT FEVER  NAUSEA AND VOMITING THAT IS NOT CONTROLLED WITH YOUR NAUSEA MEDICATION  *UNUSUAL SHORTNESS OF BREATH  *UNUSUAL BRUISING OR BLEEDING  TENDERNESS IN MOUTH AND THROAT WITH OR WITHOUT PRESENCE OF ULCERS  *URINARY PROBLEMS  *BOWEL PROBLEMS  UNUSUAL RASH Items with * indicate a potential emergency and should be followed up as soon as possible.  One of the nurses will contact you 24 hours after your treatment. Please let the nurse know about any problems that you may have experienced. Feel free to call the clinic you have any questions or concerns. The clinic phone number is [phone].   I have been informed and understand all the instructions given to me. I know to contact the clinic, my physician, or go to the Emergency Department if any problems should occur. I do not have any questions at this time, but understand that I may call the clinic during office hours   should I have any questions or need assistance in obtaining follow up care.    __________________________________________  _____________  __________ Signature of Patient or Authorized Representative            Date                   Time    __________________________________________ Nurse's Signature    

## 2011-07-13 ENCOUNTER — Ambulatory Visit (HOSPITAL_BASED_OUTPATIENT_CLINIC_OR_DEPARTMENT_OTHER): Payer: Federal, State, Local not specified - PPO

## 2011-07-13 ENCOUNTER — Other Ambulatory Visit: Payer: Self-pay | Admitting: *Deleted

## 2011-07-13 ENCOUNTER — Other Ambulatory Visit (HOSPITAL_BASED_OUTPATIENT_CLINIC_OR_DEPARTMENT_OTHER): Payer: Federal, State, Local not specified - PPO | Admitting: Lab

## 2011-07-13 VITALS — BP 134/73 | HR 68

## 2011-07-13 DIAGNOSIS — C50912 Malignant neoplasm of unspecified site of left female breast: Secondary | ICD-10-CM

## 2011-07-13 DIAGNOSIS — C50419 Malignant neoplasm of upper-outer quadrant of unspecified female breast: Secondary | ICD-10-CM

## 2011-07-13 DIAGNOSIS — C50119 Malignant neoplasm of central portion of unspecified female breast: Secondary | ICD-10-CM

## 2011-07-13 DIAGNOSIS — C773 Secondary and unspecified malignant neoplasm of axilla and upper limb lymph nodes: Secondary | ICD-10-CM

## 2011-07-13 DIAGNOSIS — Z5111 Encounter for antineoplastic chemotherapy: Secondary | ICD-10-CM

## 2011-07-13 LAB — CBC WITH DIFFERENTIAL/PLATELET
BASO%: 0.2 % (ref 0.0–2.0)
LYMPH%: 12.2 % — ABNORMAL LOW (ref 14.0–49.7)
MCHC: 34.1 g/dL (ref 31.5–36.0)
MONO#: 0.9 10*3/uL (ref 0.1–0.9)
RBC: 3.3 10*6/uL — ABNORMAL LOW (ref 3.70–5.45)
WBC: 13.2 10*3/uL — ABNORMAL HIGH (ref 3.9–10.3)
lymph#: 1.6 10*3/uL (ref 0.9–3.3)
nRBC: 0 % (ref 0–0)

## 2011-07-13 LAB — COMPREHENSIVE METABOLIC PANEL
ALT: 31 U/L (ref 0–35)
AST: 22 U/L (ref 0–37)
Calcium: 9.1 mg/dL (ref 8.4–10.5)
Chloride: 105 mEq/L (ref 96–112)
Creatinine, Ser: 0.73 mg/dL (ref 0.50–1.10)
Potassium: 3.9 mEq/L (ref 3.5–5.3)

## 2011-07-13 MED ORDER — DEXAMETHASONE SODIUM PHOSPHATE 4 MG/ML IJ SOLN
20.0000 mg | Freq: Once | INTRAMUSCULAR | Status: AC
  Administered 2011-07-13: 20 mg via INTRAVENOUS

## 2011-07-13 MED ORDER — ACETAMINOPHEN 325 MG PO TABS
650.0000 mg | ORAL_TABLET | Freq: Once | ORAL | Status: AC
  Administered 2011-07-13: 650 mg via ORAL

## 2011-07-13 MED ORDER — TRASTUZUMAB CHEMO INJECTION 440 MG
2.0000 mg/kg | Freq: Once | INTRAVENOUS | Status: AC
  Administered 2011-07-13: 126 mg via INTRAVENOUS
  Filled 2011-07-13: qty 6

## 2011-07-13 MED ORDER — SODIUM CHLORIDE 0.9 % IV SOLN
670.0000 mg | Freq: Once | INTRAVENOUS | Status: AC
  Administered 2011-07-13: 670 mg via INTRAVENOUS
  Filled 2011-07-13: qty 67

## 2011-07-13 MED ORDER — DIPHENHYDRAMINE HCL 25 MG PO CAPS
50.0000 mg | ORAL_CAPSULE | Freq: Once | ORAL | Status: AC
  Administered 2011-07-13: 50 mg via ORAL

## 2011-07-13 MED ORDER — DOCETAXEL CHEMO INJECTION 160 MG/16ML
75.0000 mg/m2 | Freq: Once | INTRAVENOUS | Status: AC
  Administered 2011-07-13: 120 mg via INTRAVENOUS
  Filled 2011-07-13: qty 12

## 2011-07-13 MED ORDER — ONDANSETRON 16 MG/50ML IVPB (CHCC)
16.0000 mg | Freq: Once | INTRAVENOUS | Status: AC
  Administered 2011-07-13: 16 mg via INTRAVENOUS

## 2011-07-13 NOTE — Patient Instructions (Signed)
[facility] Discharge Instructions for Patients Receiving Chemotherapy  Today you received the following chemotherapy agents: Taxotere, Carboplatin, Herceptin.  To help prevent nausea and vomiting after your treatment, we encourage you to take your nausea medications.   If you develop nausea and vomiting that is not controlled by your nausea medication, call the clinic.   BELOW ARE SYMPTOMS THAT SHOULD BE REPORTED IMMEDIATELY:  *FEVER GREATER THAN 100.5 F  *CHILLS WITH OR WITHOUT FEVER  NAUSEA AND VOMITING THAT IS NOT CONTROLLED WITH YOUR NAUSEA MEDICATION  *UNUSUAL SHORTNESS OF BREATH  *UNUSUAL BRUISING OR BLEEDING  TENDERNESS IN MOUTH AND THROAT WITH OR WITHOUT PRESENCE OF ULCERS  *URINARY PROBLEMS  *BOWEL PROBLEMS  UNUSUAL RASH Items with * indicate a potential emergency and should be followed up as soon as possible.  Feel free to call the clinic you have any questions or concerns. The clinic phone number is [phone].

## 2011-07-14 ENCOUNTER — Encounter: Payer: Self-pay | Admitting: Oncology

## 2011-07-14 ENCOUNTER — Ambulatory Visit (HOSPITAL_BASED_OUTPATIENT_CLINIC_OR_DEPARTMENT_OTHER): Payer: Federal, State, Local not specified - PPO

## 2011-07-14 ENCOUNTER — Other Ambulatory Visit: Payer: Self-pay | Admitting: *Deleted

## 2011-07-14 VITALS — BP 138/78 | HR 62 | Temp 97.1°F | Wt 133.4 lb

## 2011-07-14 DIAGNOSIS — Z5189 Encounter for other specified aftercare: Secondary | ICD-10-CM

## 2011-07-14 DIAGNOSIS — C50419 Malignant neoplasm of upper-outer quadrant of unspecified female breast: Secondary | ICD-10-CM

## 2011-07-14 DIAGNOSIS — C50912 Malignant neoplasm of unspecified site of left female breast: Secondary | ICD-10-CM

## 2011-07-14 MED ORDER — PEGFILGRASTIM INJECTION 6 MG/0.6ML
6.0000 mg | Freq: Once | SUBCUTANEOUS | Status: AC
  Administered 2011-07-14: 6 mg via SUBCUTANEOUS
  Filled 2011-07-14: qty 0.6

## 2011-07-14 MED ORDER — LORAZEPAM 0.5 MG PO TABS: ORAL_TABLET | ORAL | Status: DC

## 2011-07-14 NOTE — Progress Notes (Signed)
Patient came in today with husband, patient was informed about submitting the EOBs for future payment for injections patient is receiving patient has a copay card, also patient was made aware that balance she has for the injection will be adjusted for her.

## 2011-07-16 ENCOUNTER — Telehealth: Payer: Self-pay | Admitting: *Deleted

## 2011-07-16 NOTE — Telephone Encounter (Signed)
RX FOR ATIVAN CALLED INTO PT PHARMACY.[name] PT REPORTS THAT WHEN SHE" PICKED UP MEDICATION AND READ ALL THE SIDE EFFECTS" SHE WAS NOT GOING TO BE TAKING IT. THIS NURSE HAS REMOVED IT FROM MED LIST

## 2011-07-19 ENCOUNTER — Other Ambulatory Visit: Payer: Self-pay | Admitting: Physician Assistant

## 2011-07-19 DIAGNOSIS — C50912 Malignant neoplasm of unspecified site of left female breast: Secondary | ICD-10-CM

## 2011-07-20 ENCOUNTER — Ambulatory Visit (HOSPITAL_BASED_OUTPATIENT_CLINIC_OR_DEPARTMENT_OTHER): Payer: Federal, State, Local not specified - PPO

## 2011-07-20 ENCOUNTER — Ambulatory Visit (HOSPITAL_BASED_OUTPATIENT_CLINIC_OR_DEPARTMENT_OTHER): Payer: Federal, State, Local not specified - PPO | Admitting: Physician Assistant

## 2011-07-20 ENCOUNTER — Other Ambulatory Visit (HOSPITAL_BASED_OUTPATIENT_CLINIC_OR_DEPARTMENT_OTHER): Payer: Federal, State, Local not specified - PPO

## 2011-07-20 VITALS — BP 147/91 | HR 93 | Temp 98.9°F | Ht 60.0 in | Wt 128.1 lb

## 2011-07-20 DIAGNOSIS — Z5112 Encounter for antineoplastic immunotherapy: Secondary | ICD-10-CM

## 2011-07-20 DIAGNOSIS — C50419 Malignant neoplasm of upper-outer quadrant of unspecified female breast: Secondary | ICD-10-CM

## 2011-07-20 DIAGNOSIS — C773 Secondary and unspecified malignant neoplasm of axilla and upper limb lymph nodes: Secondary | ICD-10-CM

## 2011-07-20 DIAGNOSIS — C50912 Malignant neoplasm of unspecified site of left female breast: Secondary | ICD-10-CM

## 2011-07-20 DIAGNOSIS — C50119 Malignant neoplasm of central portion of unspecified female breast: Secondary | ICD-10-CM

## 2011-07-20 DIAGNOSIS — Z170 Estrogen receptor positive status [ER+]: Secondary | ICD-10-CM

## 2011-07-20 DIAGNOSIS — C50919 Malignant neoplasm of unspecified site of unspecified female breast: Secondary | ICD-10-CM

## 2011-07-20 LAB — CBC WITH DIFFERENTIAL/PLATELET
Basophils Absolute: 0 10*3/uL (ref 0.0–0.1)
Eosinophils Absolute: 0 10*3/uL (ref 0.0–0.5)
HGB: 9.9 g/dL — ABNORMAL LOW (ref 11.6–15.9)
LYMPH%: 22 % (ref 14.0–49.7)
MCV: 89.2 fL (ref 79.5–101.0)
MONO#: 1.6 10*3/uL — ABNORMAL HIGH (ref 0.1–0.9)
MONO%: 19.8 % — ABNORMAL HIGH (ref 0.0–14.0)
NEUT#: 4.7 10*3/uL (ref 1.5–6.5)
Platelets: 266 10*3/uL (ref 145–400)
RBC: 3.25 10*6/uL — ABNORMAL LOW (ref 3.70–5.45)
RDW: 14.3 % (ref 11.2–14.5)
WBC: 8.1 10*3/uL (ref 3.9–10.3)

## 2011-07-20 MED ORDER — DIPHENHYDRAMINE HCL 25 MG PO CAPS
50.0000 mg | ORAL_CAPSULE | Freq: Once | ORAL | Status: AC
  Administered 2011-07-20: 50 mg via ORAL

## 2011-07-20 MED ORDER — SODIUM CHLORIDE 0.9 % IV SOLN
Freq: Once | INTRAVENOUS | Status: AC
  Administered 2011-07-20: 15:00:00 via INTRAVENOUS

## 2011-07-20 MED ORDER — HEPARIN SOD (PORK) LOCK FLUSH 100 UNIT/ML IV SOLN
500.0000 [IU] | Freq: Once | INTRAVENOUS | Status: AC | PRN
  Administered 2011-07-20: 500 [IU]
  Filled 2011-07-20: qty 5

## 2011-07-20 MED ORDER — SODIUM CHLORIDE 0.9 % IJ SOLN
10.0000 mL | INTRAMUSCULAR | Status: DC | PRN
  Administered 2011-07-20: 10 mL
  Filled 2011-07-20: qty 10

## 2011-07-20 MED ORDER — ACETAMINOPHEN 325 MG PO TABS
650.0000 mg | ORAL_TABLET | Freq: Once | ORAL | Status: AC
  Administered 2011-07-20: 650 mg via ORAL

## 2011-07-20 MED ORDER — TRASTUZUMAB CHEMO INJECTION 440 MG
2.0000 mg/kg | Freq: Once | INTRAVENOUS | Status: AC
  Administered 2011-07-20: 126 mg via INTRAVENOUS
  Filled 2011-07-20: qty 6

## 2011-07-20 NOTE — Patient Instructions (Signed)
[facility] Discharge Instructions for Patients Receiving Chemotherapy  Today you received the following chemotherapy agents Herceptin To help prevent nausea and vomiting after your treatment, we encourage you to take your nausea medication as prescribed.  If you develop nausea and vomiting that is not controlled by your nausea medication, call the clinic. If it is after clinic hours your family physician or the after hours number for the clinic or go to the Emergency Department.   BELOW ARE SYMPTOMS THAT SHOULD BE REPORTED IMMEDIATELY:  *FEVER GREATER THAN 100.5 F  *CHILLS WITH OR WITHOUT FEVER  NAUSEA AND VOMITING THAT IS NOT CONTROLLED WITH YOUR NAUSEA MEDICATION  *UNUSUAL SHORTNESS OF BREATH  *UNUSUAL BRUISING OR BLEEDING  TENDERNESS IN MOUTH AND THROAT WITH OR WITHOUT PRESENCE OF ULCERS  *URINARY PROBLEMS  *BOWEL PROBLEMS  UNUSUAL RASH Items with * indicate a potential emergency and should be followed up as soon as possible.  One of the nurses will contact you 24 hours after your treatment. Please let the nurse know about any problems that you may have experienced. Feel free to call the clinic you have any questions or concerns. The clinic phone number is [phone].   I have been informed and understand all the instructions given to me. I know to contact the clinic, my physician, or go to the Emergency Department if any problems should occur. I do not have any questions at this time, but understand that I may call the clinic during office hours   should I have any questions or need assistance in obtaining follow up care.    __________________________________________  _____________  __________ Signature of Patient or Authorized Representative            Date                   Time    __________________________________________ Nurse's Signature    

## 2011-07-20 NOTE — Progress Notes (Signed)
Hematology and Oncology Follow Up Visit  Kirsten Gibson [id number] 04-18-1945 66 y.o. 07/20/11   HPI: Kirsten Gibson is a 66 year old [place] woman with newly diagnosed bilateral breast carcinoma now being treated in the neoadjuvant setting, due for week 2 cycle 3/6 q3week TCH, with weekly Herceptin and Neulasta support on day 2.   -Infiltrating ductal carcinoma of the right breast, ER/PR/HER-2 positive with positive axillary lymph node involvement   -Infiltrating ductal carcinoma the left breast, ER/PR positive HER-2 negative.  Interim History:   Kirsten Gibson is seen today with her husband in accompaniment in anticipation of week 2 cycle 3/6 planned every 3 week doses of neoadjuvant TCH, which will be single agent Hercaptin. Overall, she is actually feeling pretty well denying any unexplained fevers, chills, or night sweats. She's continuing to have no difficulty with nausea. Prior constipation issues have resolved. She had experienced profound fatigue but this has improved.  She has noted some skin flushing for about 2-3 days after T/C/H "cocktail", but it is not pruritic, and self-limiting.  No neuropathy symptoms, nailbed sensitivity, or excessive eye tearing.   A detailed review of systems is otherwise noncontributory as noted below.  Review of Systems: Constitutional:  no weight loss, fever, night sweats and fatigued Eyes: uses glasses ENT: no complaints Cardiovascular: no chest pain or dyspnea on exertion Respiratory: no cough, shortness of breath, or wheezing Neurological: no TIA or stroke symptoms Dermatological: History of mycosis fungoides. Gastrointestinal: no abdominal pain, change in bowel habits, or black or bloody stools Genito-Urinary: no dysuria, trouble voiding, or hematuria Hematological and Lymphatic: negative Breast: known bilateral breast cancer Musculoskeletal: negative Remaining ROS negative.   Medications:   I have reviewed the patient's current  medications.  Current Outpatient Prescriptions  Medication Sig Dispense Refill  . ALPRAZolam (XANAX) 0.25 MG tablet Take 1 tablet (0.25 mg total) by mouth at bedtime as needed.  60 tablet  1  . calcium carbonate (OS-CAL) 600 MG TABS Take 600 mg by mouth 3 (three) times daily with meals.      . Cholecalciferol (VITAMIN D3 PO) Take 1,200 mg by mouth daily.      [name] dexamethasone (DECADRON) 4 MG tablet Take 2 tablets two times a day the day before Taxotere. Then take 2 tabs two times a day starting the day after chemo for 3 days.  30 tablet  1  . lidocaine-prilocaine (EMLA) cream APPLY TO PORT A CATH AS DIRECTED BEFORE CHEMO TREATMENT.  30 g  3  . ondansetron (ZOFRAN) 8 MG tablet Take 1 tablet two times a day starting the day after chemo for 3 days. Then take 1 tab two times a day as needed for nausea or vomiting.  30 tablet  1  . prochlorperazine (COMPAZINE) 10 MG tablet Take 1 tablet (10 mg total) by mouth every 6 (six) hours as needed (Nausea or vomiting).  30 tablet  1  . prochlorperazine (COMPAZINE) 25 MG suppository Place 1 suppository (25 mg total) rectally every 12 (twelve) hours as needed for nausea.  12 suppository  3   No current facility-administered medications for this visit.   Facility-Administered Medications Ordered in Other Visits  Medication Dose Route Frequency Provider Last Rate Last Dose  . 0.9 %  sodium chloride infusion   Intravenous Once Pierce Crane, MD      . acetaminophen (TYLENOL) tablet 650 mg  650 mg Oral Once Pierce Crane, MD   650 mg at 07/20/11 1530  . diphenhydrAMINE (BENADRYL) capsule 50 mg  50 mg Oral Once Pierce Crane, MD   50 mg at 07/20/11 1531  . heparin lock flush 100 unit/mL  500 Units Intracatheter Once PRN Pierce Crane, MD   500 Units at 07/20/11 1648  . lidocaine-prilocaine (EMLA) cream   Topical Once Pierce Crane, MD      . trastuzumab (HERCEPTIN) 126 mg in sodium chloride 0.9 % 250 mL chemo infusion  2 mg/kg (Treatment Plan Actual) Intravenous Once Pierce Crane, MD   126 mg at 07/20/11 1604  . DISCONTD: sodium chloride 0.9 % injection 10 mL  10 mL Intracatheter PRN Pierce Crane, MD   10 mL at 07/20/11 1648    Allergies:  Allergies  Allergen Reactions  . Latex Dermatitis and Rash  . Adhesive (Tape)     ALLERGIC TO OP-SITE......USE OP-SITE FLEXIGRID INSTEAD !!!  . Diclofenac Other (See Comments)    Fever, chills  . Naproxen Other (See Comments)    Fever, chills    Physical Exam: Filed Vitals:   07/20/11 1416  BP: 147/91  Pulse: 93  Temp: 98.9 F (37.2 C)    Body mass index is 25.02 kg/(m^2). Weight: 128 lbs. HEENT:  Sclerae anicteric, conjunctivae pink.  Oropharynx clear.  No mucositis or candidiasis.   Nodes:  No cervical, supraclavicular, or axillary lymphadenopathy palpated.  Breast Exam: Right breast exam reveals thickening within the upper outer quadrant, no evidence of palpable lymphadenopathy.  The left breast is free of any definable masses, axilla also clear. Lungs:  Clear to auscultation bilaterally.  No crackles, rhonchi, or wheezes.   Heart:  Regular rate and rhythm.   Abdomen:  Soft, nontender.  Positive bowel sounds.  No organomegaly or masses palpated.   Musculoskeletal:  No focal spinal tenderness to palpation.  Extremities:  Benign.  No peripheral edema or cyanosis.   Skin:  Benign, no PPE changes. Neuro:  Nonfocal, alert and oriented x 3.   Lab Results: Lab Results  Component Value Date   WBC 8.1 07/20/2011   HGB 9.9* 07/20/2011   HCT 29.0* 07/20/2011   MCV 89.2 07/20/2011   PLT 266 07/20/2011   NEUTROABS 4.7 07/20/2011     Chemistry      Component Value Date/Time   NA 138 07/13/2011 1235   K 3.9 07/13/2011 1235   CL 105 07/13/2011 1235   CO2 24 07/13/2011 1235   BUN 17 07/13/2011 1235   CREATININE 0.73 07/13/2011 1235      Component Value Date/Time   CALCIUM 9.1 07/13/2011 1235   ALKPHOS 85 07/13/2011 1235   AST 22 07/13/2011 1235   ALT 31 07/13/2011 1235   BILITOT 0.2* 07/13/2011 1235      Lab Results  Component  Value Date   LABCA2 25 05/12/2011     Assessment:  Kirsten Gibson is a 66 year old [place] woman with newly diagnosed bilateral breast carcinoma now being treated in the neoadjuvant setting, due for week 2 cycle 3/6 q3week TCH, with weekly Herceptin and Neulasta support on day 2.   -Infiltrating ductal carcinoma of the right breast, ER/PR/HER-2 positive with positive axillary lymph node involvement   -Infiltrating ductal carcinoma the left breast, ER/PR positive HER-2 negative.  2. Flushing that I highly suspect is associated with dexamethasone dosing, and I have reassured patient in this regard.  Case reviewed with Dr. Pierce Crane.  Plan:  Mrs. Wann receive week 2, cycle 3/6 planned neoadjuvant [facility] which is weekly Herceptin.  She will return in one week's time with labs prior,  for week 3 cycle 3/6 every three week TCH which will be single agent Herceptin.  On 08/03/11, she will receive week 1 cycle 4 of TCH, prior to which a bilateral breast MRI and referral back to Dr. Jamey Ripa will occur. She and her husband know that they can feel free to contact us prior to scheduled follow up if the need should arise.  This plan was reviewed with the patient, who voices understanding and agreement.  She knows to call with any changes or problems.    Jeani Fassnacht T, PA-C 07/20/11

## 2011-07-21 ENCOUNTER — Telehealth: Payer: Self-pay | Admitting: *Deleted

## 2011-07-21 NOTE — Telephone Encounter (Signed)
patient confirmed over the phone the new date and time on the mri of the breast and appointment with dr.streck on 08-13-2011

## 2011-07-23 ENCOUNTER — Ambulatory Visit: Payer: Federal, State, Local not specified - PPO | Admitting: Nutrition

## 2011-07-23 NOTE — Progress Notes (Signed)
Kirsten Gibson called on the telephone with some questions.  She is a 66 year old female patient of Dr. Renelda Loma diagnosed with bilateral breast cancer.  I spoke with her back on May 7th and did an initial nutrition assessment.  At that time, her weight was documented as 132.5 pounds with a usual body weight of 140 pounds.  The patient reports that she has continued to struggle with eating.  Her weight has declined and was documented as 128.1 pounds on June 11th.  The patient has difficulty finding foods that she can eat.  She does tolerate Ensure and has been drinking 1 daily.  She generally has a healthy diet, but is not tolerating the majority of food she used to consume.  NUTRITION DIAGNOSIS:  Food and nutrition-related knowledge deficit continues.  INTERVENTION:  I have educated Kirsten Gibson on strategies for dealing with taste alterations.  I have also educated her on ways she can increase calories and protein throughout the day.  I have encouraged smaller, more frequent meals utilizing colder foods.  I have reviewed ways to add protein and calories to her diet.  I will mail fact sheets to her for further reference.  MONITORING/EVALUATION/GOALS:  The patient will tolerate increased oral intake to promote weight maintenance.  NEXT VISIT:  Patient will contact me with further questions or concerns.    ______________________________ Zenovia Jarred, RD, LDN Clinical Nutrition Specialist BN/MEDQ  D:  07/23/2011  T:  07/23/2011  Job:  [id number]

## 2011-07-26 ENCOUNTER — Ambulatory Visit (HOSPITAL_COMMUNITY)
Admission: RE | Admit: 2011-07-26 | Discharge: 2011-07-26 | Disposition: A | Discharge status: Home / Self Care | Payer: Federal, State, Local not specified - PPO | Source: Ambulatory Visit | Attending: Physician Assistant | Admitting: Physician Assistant

## 2011-07-26 DIAGNOSIS — R599 Enlarged lymph nodes, unspecified: Secondary | ICD-10-CM | POA: Insufficient documentation

## 2011-07-26 DIAGNOSIS — C50419 Malignant neoplasm of upper-outer quadrant of unspecified female breast: Secondary | ICD-10-CM

## 2011-07-26 DIAGNOSIS — C50912 Malignant neoplasm of unspecified site of left female breast: Secondary | ICD-10-CM

## 2011-07-26 DIAGNOSIS — C50119 Malignant neoplasm of central portion of unspecified female breast: Secondary | ICD-10-CM | POA: Insufficient documentation

## 2011-07-26 MED ORDER — GADOBENATE DIMEGLUMINE 529 MG/ML IV SOLN
15.0000 mL | Freq: Once | INTRAVENOUS | Status: AC | PRN
  Administered 2011-07-26: 11 mL via INTRAVENOUS

## 2011-07-27 ENCOUNTER — Other Ambulatory Visit (HOSPITAL_BASED_OUTPATIENT_CLINIC_OR_DEPARTMENT_OTHER): Payer: Federal, State, Local not specified - PPO | Admitting: Lab

## 2011-07-27 ENCOUNTER — Ambulatory Visit (HOSPITAL_BASED_OUTPATIENT_CLINIC_OR_DEPARTMENT_OTHER): Payer: Federal, State, Local not specified - PPO

## 2011-07-27 VITALS — BP 153/60 | HR 72 | Temp 98.6°F

## 2011-07-27 DIAGNOSIS — C50119 Malignant neoplasm of central portion of unspecified female breast: Secondary | ICD-10-CM

## 2011-07-27 DIAGNOSIS — C50919 Malignant neoplasm of unspecified site of unspecified female breast: Secondary | ICD-10-CM

## 2011-07-27 DIAGNOSIS — C50912 Malignant neoplasm of unspecified site of left female breast: Secondary | ICD-10-CM

## 2011-07-27 DIAGNOSIS — C773 Secondary and unspecified malignant neoplasm of axilla and upper limb lymph nodes: Secondary | ICD-10-CM

## 2011-07-27 DIAGNOSIS — C50419 Malignant neoplasm of upper-outer quadrant of unspecified female breast: Secondary | ICD-10-CM

## 2011-07-27 DIAGNOSIS — Z5112 Encounter for antineoplastic immunotherapy: Secondary | ICD-10-CM

## 2011-07-27 LAB — BASIC METABOLIC PANEL
Chloride: 106 mEq/L (ref 96–112)
Creatinine, Ser: 0.6 mg/dL (ref 0.50–1.10)
Potassium: 3.9 mEq/L (ref 3.5–5.3)
Sodium: 141 mEq/L (ref 135–145)

## 2011-07-27 LAB — CBC WITH DIFFERENTIAL/PLATELET
BASO%: 0.3 % (ref 0.0–2.0)
Eosinophils Absolute: 0 10*3/uL (ref 0.0–0.5)
MCHC: 33.6 g/dL (ref 31.5–36.0)
MONO#: 0.4 10*3/uL (ref 0.1–0.9)
NEUT#: 4.7 10*3/uL (ref 1.5–6.5)
RBC: 2.87 10*6/uL — ABNORMAL LOW (ref 3.70–5.45)
RDW: 14.6 % — ABNORMAL HIGH (ref 11.2–14.5)
WBC: 7.1 10*3/uL (ref 3.9–10.3)
lymph#: 2 10*3/uL (ref 0.9–3.3)
nRBC: 0 % (ref 0–0)

## 2011-07-27 MED ORDER — DIPHENHYDRAMINE HCL 25 MG PO CAPS
50.0000 mg | ORAL_CAPSULE | Freq: Once | ORAL | Status: AC
  Administered 2011-07-27: 50 mg via ORAL

## 2011-07-27 MED ORDER — ACETAMINOPHEN 325 MG PO TABS
650.0000 mg | ORAL_TABLET | Freq: Once | ORAL | Status: AC
  Administered 2011-07-27: 650 mg via ORAL

## 2011-07-27 MED ORDER — SODIUM CHLORIDE 0.9 % IV SOLN
Freq: Once | INTRAVENOUS | Status: AC
  Administered 2011-07-27: 11:00:00 via INTRAVENOUS

## 2011-07-27 MED ORDER — TRASTUZUMAB CHEMO INJECTION 440 MG
2.0000 mg/kg | Freq: Once | INTRAVENOUS | Status: AC
  Administered 2011-07-27: 126 mg via INTRAVENOUS
  Filled 2011-07-27: qty 6

## 2011-08-03 ENCOUNTER — Other Ambulatory Visit (HOSPITAL_BASED_OUTPATIENT_CLINIC_OR_DEPARTMENT_OTHER): Payer: Federal, State, Local not specified - PPO | Admitting: Lab

## 2011-08-03 ENCOUNTER — Ambulatory Visit (HOSPITAL_BASED_OUTPATIENT_CLINIC_OR_DEPARTMENT_OTHER): Payer: Federal, State, Local not specified - PPO | Admitting: Physician Assistant

## 2011-08-03 ENCOUNTER — Ambulatory Visit (HOSPITAL_BASED_OUTPATIENT_CLINIC_OR_DEPARTMENT_OTHER): Payer: Federal, State, Local not specified - PPO

## 2011-08-03 ENCOUNTER — Telehealth: Payer: Self-pay | Admitting: *Deleted

## 2011-08-03 VITALS — BP 156/81 | HR 73 | Temp 97.6°F | Ht 60.0 in | Wt 132.1 lb

## 2011-08-03 DIAGNOSIS — C50419 Malignant neoplasm of upper-outer quadrant of unspecified female breast: Secondary | ICD-10-CM

## 2011-08-03 DIAGNOSIS — Z170 Estrogen receptor positive status [ER+]: Secondary | ICD-10-CM

## 2011-08-03 DIAGNOSIS — C773 Secondary and unspecified malignant neoplasm of axilla and upper limb lymph nodes: Secondary | ICD-10-CM

## 2011-08-03 DIAGNOSIS — C50119 Malignant neoplasm of central portion of unspecified female breast: Secondary | ICD-10-CM

## 2011-08-03 DIAGNOSIS — C50912 Malignant neoplasm of unspecified site of left female breast: Secondary | ICD-10-CM

## 2011-08-03 DIAGNOSIS — Z5111 Encounter for antineoplastic chemotherapy: Secondary | ICD-10-CM

## 2011-08-03 LAB — CBC WITH DIFFERENTIAL/PLATELET
BASO%: 0.2 % (ref 0.0–2.0)
EOS%: 0 % (ref 0.0–7.0)
HCT: 26.3 % — ABNORMAL LOW (ref 34.8–46.6)
LYMPH%: 11.5 % — ABNORMAL LOW (ref 14.0–49.7)
MCH: 31.5 pg (ref 25.1–34.0)
MCHC: 34.6 g/dL (ref 31.5–36.0)
NEUT%: 80.6 % — ABNORMAL HIGH (ref 38.4–76.8)
Platelets: 98 10*3/uL — ABNORMAL LOW (ref 145–400)

## 2011-08-03 LAB — COMPREHENSIVE METABOLIC PANEL
ALT: 53 U/L — ABNORMAL HIGH (ref 0–35)
AST: 31 U/L (ref 0–37)
Creatinine, Ser: 0.7 mg/dL (ref 0.50–1.10)
Total Bilirubin: 0.4 mg/dL (ref 0.3–1.2)

## 2011-08-03 LAB — LACTATE DEHYDROGENASE: LDH: 233 U/L (ref 94–250)

## 2011-08-03 MED ORDER — HEPARIN SOD (PORK) LOCK FLUSH 100 UNIT/ML IV SOLN
500.0000 [IU] | Freq: Once | INTRAVENOUS | Status: DC | PRN
  Filled 2011-08-03: qty 5

## 2011-08-03 MED ORDER — DIPHENHYDRAMINE HCL 25 MG PO CAPS
50.0000 mg | ORAL_CAPSULE | Freq: Once | ORAL | Status: AC
  Administered 2011-08-03: 50 mg via ORAL

## 2011-08-03 MED ORDER — DEXAMETHASONE SODIUM PHOSPHATE 4 MG/ML IJ SOLN
20.0000 mg | Freq: Once | INTRAMUSCULAR | Status: AC
  Administered 2011-08-03: 20 mg via INTRAVENOUS

## 2011-08-03 MED ORDER — ACETAMINOPHEN 325 MG PO TABS
650.0000 mg | ORAL_TABLET | Freq: Once | ORAL | Status: AC
  Administered 2011-08-03: 650 mg via ORAL

## 2011-08-03 MED ORDER — ONDANSETRON 16 MG/50ML IVPB (CHCC)
16.0000 mg | Freq: Once | INTRAVENOUS | Status: AC
  Administered 2011-08-03: 16 mg via INTRAVENOUS

## 2011-08-03 MED ORDER — TRASTUZUMAB CHEMO INJECTION 440 MG
2.0000 mg/kg | Freq: Once | INTRAVENOUS | Status: AC
  Administered 2011-08-03: 126 mg via INTRAVENOUS
  Filled 2011-08-03: qty 6

## 2011-08-03 MED ORDER — SODIUM CHLORIDE 0.9 % IV SOLN
Freq: Once | INTRAVENOUS | Status: AC
  Administered 2011-08-03: 14:00:00 via INTRAVENOUS

## 2011-08-03 MED ORDER — DEXTROSE 5 % IV SOLN
75.0000 mg/m2 | Freq: Once | INTRAVENOUS | Status: AC
  Administered 2011-08-03: 120 mg via INTRAVENOUS
  Filled 2011-08-03: qty 12

## 2011-08-03 MED ORDER — SODIUM CHLORIDE 0.9 % IV SOLN
670.0000 mg | Freq: Once | INTRAVENOUS | Status: AC
  Administered 2011-08-03: 670 mg via INTRAVENOUS
  Filled 2011-08-03: qty 67

## 2011-08-03 MED ORDER — SODIUM CHLORIDE 0.9 % IJ SOLN
10.0000 mL | INTRAMUSCULAR | Status: DC | PRN
  Administered 2011-08-03: 10 mL
  Filled 2011-08-03: qty 10

## 2011-08-03 NOTE — Telephone Encounter (Signed)
Patient has been scheduled all ready to come in and see dr.wentworth

## 2011-08-03 NOTE — Progress Notes (Signed)
Pt saw Debbora Presto, PA  And  Dr.  Donnie Coffin today.   Per Thayer Ohm, [place]   -   Proceed with chemo as ordered today with platelet ct  98.   Explanations given to pt/husband.  Both voiced understanding.

## 2011-08-03 NOTE — Progress Notes (Signed)
Hematology and Oncology Follow Up Visit  CHAUNTELLE AZPEITIA [id number] 26-Sep-1945 66 y.o. 08/03/2011    HPI: Kirsten Gibson is a 66 year old [place] woman with newly diagnosed bilateral breast carcinoma now being treated in the neoadjuvant setting, due for week 1 cycle 4/6 q3week TCH, with weekly Herceptin and Neulasta support on day 2.   -Infiltrating ductal carcinoma of the right breast, ER/PR/HER-2 positive with positive axillary lymph node involvement   -Infiltrating ductal carcinoma the left breast, ER/PR positive HER-2 negative.  Interim History:   Kirsten Gibson is seen today with her husband in accompaniment in anticipation of week 1 cycle 4/6 planned every 3 week doses of neoadjuvant TCH, which will be combined therapy. Overall, she is actually feeling pretty well denying any unexplained fevers, chills, or night sweats. She's continuing to have no difficulty with nausea. Prior constipation issues have resolved. She had experienced profound fatigue but this has improved.  She has not noted any skin changes, no neuropathy symptoms, nailbed sensitivity, or excessive eye tearing.  A bilateral breast MRI was performed on 07/26/11.  A detailed review of systems is otherwise noncontributory as noted below.  Review of Systems: Constitutional:  no weight loss, fever, night sweats and fatigued Eyes: uses glasses ENT: no complaints Cardiovascular: no chest pain or dyspnea on exertion Respiratory: no cough, shortness of breath, or wheezing Neurological: no TIA or stroke symptoms Dermatological: History of mycosis fungoides. Gastrointestinal: no abdominal pain, change in bowel habits, or black or bloody stools Genito-Urinary: no dysuria, trouble voiding, or hematuria Hematological and Lymphatic: negative Breast: known bilateral breast cancer Musculoskeletal: negative Remaining ROS negative.   Medications:   I have reviewed the patient's current medications.  Current Outpatient  Prescriptions  Medication Sig Dispense Refill  . ALPRAZolam (XANAX) 0.25 MG tablet Take 1 tablet (0.25 mg total) by mouth at bedtime as needed.  60 tablet  1  . calcium carbonate (OS-CAL) 600 MG TABS Take 600 mg by mouth 3 (three) times daily with meals.      . Cholecalciferol (VITAMIN D3 PO) Take 1,200 mg by mouth daily.      [name] dexamethasone (DECADRON) 4 MG tablet Take 2 tablets two times a day the day before Taxotere. Then take 2 tabs two times a day starting the day after chemo for 3 days.  30 tablet  1  . lidocaine-prilocaine (EMLA) cream APPLY TO PORT A CATH AS DIRECTED BEFORE CHEMO TREATMENT.  30 g  3  . ondansetron (ZOFRAN) 8 MG tablet Take 1 tablet two times a day starting the day after chemo for 3 days. Then take 1 tab two times a day as needed for nausea or vomiting.  30 tablet  1  . prochlorperazine (COMPAZINE) 10 MG tablet Take 1 tablet (10 mg total) by mouth every 6 (six) hours as needed (Nausea or vomiting).  30 tablet  1  . prochlorperazine (COMPAZINE) 25 MG suppository Place 1 suppository (25 mg total) rectally every 12 (twelve) hours as needed for nausea.  12 suppository  3   No current facility-administered medications for this visit.   Facility-Administered Medications Ordered in Other Visits  Medication Dose Route Frequency Provider Last Rate Last Dose  . lidocaine-prilocaine (EMLA) cream   Topical Once Pierce Crane, MD        Allergies:  Allergies  Allergen Reactions  . Latex Dermatitis and Rash  . Adhesive (Tape)     ALLERGIC TO OP-SITE......USE OP-SITE FLEXIGRID INSTEAD !!!  . Diclofenac Other (See Comments)  Fever, chills  . Naproxen Other (See Comments)    Fever, chills    Physical Exam: Filed Vitals:   08/03/11 1143  BP: 156/81  Pulse: 73  Temp: 97.6 F (36.4 C)    Body mass index is 25.80 kg/(m^2). Weight: 132 lbs. HEENT:  Sclerae anicteric, conjunctivae pink.  Oropharynx clear.  No mucositis or candidiasis.   Nodes:  No cervical, supraclavicular,  or axillary lymphadenopathy palpated.  Breast Exam: Bilateral breast exam performed by Dr. Donnie Coffin. Lungs:  Clear to auscultation bilaterally.  No crackles, rhonchi, or wheezes.   Heart:  Regular rate and rhythm.   Abdomen:  Soft, nontender.  Positive bowel sounds.  No organomegaly or masses palpated.   Musculoskeletal:  No focal spinal tenderness to palpation.  Extremities:  Benign.  No peripheral edema or cyanosis.   Skin:  Benign, no PPE changes. Neuro:  Nonfocal, alert and oriented x 3.   Lab Results: Lab Results  Component Value Date   WBC 12.8* 08/03/2011   HGB 9.1* 08/03/2011   HCT 26.3* 08/03/2011   MCV 91.0 08/03/2011   PLT 98* 08/03/2011   NEUTROABS 10.3* 08/03/2011     Chemistry      Component Value Date/Time   NA 141 07/27/2011 1028   K 3.9 07/27/2011 1028   CL 106 07/27/2011 1028   CO2 25 07/27/2011 1028   BUN 17 07/27/2011 1028   CREATININE 0.60 07/27/2011 1028      Component Value Date/Time   CALCIUM 8.4 07/27/2011 1028   ALKPHOS 85 07/13/2011 1235   AST 22 07/13/2011 1235   ALT 31 07/13/2011 1235   BILITOT 0.2* 07/13/2011 1235      Lab Results  Component Value Date   LABCA2 25 05/12/2011   Radiographic data: 07/26/11 BILATERAL BREAST MRI WITH AND WITHOUT CONTRAST  Technique: Multiplanar, multisequence MR images of both breasts  were obtained prior to and following the intravenous administration  of 11ml of multihance. Three dimensional images were evaluated at  the independent DynaCad workstation.  Comparison: Prior MRI dated 05/10/2011 as well as prior mammograms  Findings: There is a moderate background parenchymal enhancement  pattern. In the upper outer quadrant of the right breast there is an  enhancing 1.6 x 1.3 x 1.4 cm mass. At the superior and posterior  margin of the mass is a signal void from the ultrasound-guided core  biopsy marker clip. Approximately 5 cm anterior and inferior is a  second signal void from the MR guided core biopsy marker clip.  There has  been interval improvement in the non mass-like, clumped  linear enhancement in the upper outer quadrant of the breast. The  enhancement measures approximately 4.6 cm in anterior-posterior  dimension. On the prior MRI dated 05/10/2011 the dominant mass  measured 1.9 cm and the clumped linear enhancement measured 5.0 cm.  In the upper central portion of the left breast there is a signal  void in the area of the MR guided core biopsy marker clip. There  is faint 7 mm area of enhancement around the clip.  There is a left-sided Port-A-Cath. Prominent right-sided axillary lymph nodes are relatively unchanged and morphologically enlarged. Left axillary lymph nodes are  slightly prominent in size but unchanged since the prior exam. The  patient has known metastatic disease within the right axilla.  IMPRESSION:  1. Interval response to neoadjuvant chemotherapy in both breasts.  There is been interval improvement in the clumped linear  enhancement the upper outer quadrant of the right  breast as well as  the nodular enhancement in the left breast.  2. Prominent right axillary adenopathy. Moderately prominent left  axillary adenopathy. Stable.  RECOMMENDATION:  Treatment planning is recommended.  THREE-DIMENSIONAL MR IMAGE RENDERING ON INDEPENDENT WORKSTATION:  Three-dimensional MR images were rendered by post-processing of the  original MR data on an independent workstation. The three-  dimensional MR images were interpreted, and findings were reported  in the accompanying complete MRI report for this study.  BI-RADS CATEGORY 6: Known biopsy-proven malignancy - appropriate  action should be taken.  Original Report Authenticated By: Littie Deeds. Judyann Munson, M.D.       Assessment:  Kirsten Gibson is a 66 year old [place] woman with newly diagnosed bilateral breast carcinoma now being treated in the neoadjuvant setting, due for week 1 cycle 4/6 q3week TCH, with weekly Herceptin and Neulasta  support on day 2.   -Infiltrating ductal carcinoma of the right breast, ER/PR/HER-2 positive with positive axillary lymph node involvement   -Infiltrating ductal carcinoma the left breast, ER/PR positive HER-2 negative.  Case to be reviewed with Dr. Pierce Crane, who also examined the patient.  Plan:  Mrs. Rozenberg receive week 1, cycle 4/6 planned neoadjuvant TCH with weekly Herceptin. She will return on day 2 for Neulasta support.  I will see her in one week's time with labs prior, for assessment in anticipation of week 2 cycle 4/6 every three week [facility] which will be single agent Herceptin. She and her husband know that they can feel free to contact us prior if the need should arise.  This plan was reviewed with the patient, who voices understanding and agreement.  She knows to call with any changes or problems.    Ceferino Lang T, PA-C 08/03/2011

## 2011-08-04 ENCOUNTER — Ambulatory Visit (HOSPITAL_BASED_OUTPATIENT_CLINIC_OR_DEPARTMENT_OTHER): Payer: Federal, State, Local not specified - PPO

## 2011-08-04 VITALS — BP 124/72 | HR 78 | Temp 98.2°F

## 2011-08-04 DIAGNOSIS — C50119 Malignant neoplasm of central portion of unspecified female breast: Secondary | ICD-10-CM

## 2011-08-04 DIAGNOSIS — Z5189 Encounter for other specified aftercare: Secondary | ICD-10-CM

## 2011-08-04 DIAGNOSIS — C50912 Malignant neoplasm of unspecified site of left female breast: Secondary | ICD-10-CM

## 2011-08-04 DIAGNOSIS — C50419 Malignant neoplasm of upper-outer quadrant of unspecified female breast: Secondary | ICD-10-CM

## 2011-08-04 DIAGNOSIS — C773 Secondary and unspecified malignant neoplasm of axilla and upper limb lymph nodes: Secondary | ICD-10-CM

## 2011-08-04 MED ORDER — PEGFILGRASTIM INJECTION 6 MG/0.6ML
6.0000 mg | Freq: Once | SUBCUTANEOUS | Status: AC
  Administered 2011-08-04: 6 mg via SUBCUTANEOUS
  Filled 2011-08-04: qty 0.6

## 2011-08-09 ENCOUNTER — Other Ambulatory Visit: Payer: Self-pay | Admitting: Certified Registered Nurse Anesthetist

## 2011-08-10 ENCOUNTER — Telehealth: Payer: Self-pay | Admitting: *Deleted

## 2011-08-10 ENCOUNTER — Encounter: Payer: Self-pay | Admitting: Radiation Oncology

## 2011-08-10 ENCOUNTER — Other Ambulatory Visit: Payer: Self-pay | Admitting: *Deleted

## 2011-08-10 ENCOUNTER — Ambulatory Visit (HOSPITAL_BASED_OUTPATIENT_CLINIC_OR_DEPARTMENT_OTHER): Payer: Federal, State, Local not specified - PPO

## 2011-08-10 ENCOUNTER — Ambulatory Visit (HOSPITAL_BASED_OUTPATIENT_CLINIC_OR_DEPARTMENT_OTHER): Payer: Federal, State, Local not specified - PPO | Admitting: Physician Assistant

## 2011-08-10 ENCOUNTER — Other Ambulatory Visit (HOSPITAL_BASED_OUTPATIENT_CLINIC_OR_DEPARTMENT_OTHER): Payer: Federal, State, Local not specified - PPO | Admitting: Lab

## 2011-08-10 VITALS — BP 108/74 | HR 93 | Temp 97.5°F | Ht 60.0 in | Wt 126.4 lb

## 2011-08-10 DIAGNOSIS — C50119 Malignant neoplasm of central portion of unspecified female breast: Secondary | ICD-10-CM

## 2011-08-10 DIAGNOSIS — C773 Secondary and unspecified malignant neoplasm of axilla and upper limb lymph nodes: Secondary | ICD-10-CM

## 2011-08-10 DIAGNOSIS — C50912 Malignant neoplasm of unspecified site of left female breast: Secondary | ICD-10-CM

## 2011-08-10 DIAGNOSIS — C50419 Malignant neoplasm of upper-outer quadrant of unspecified female breast: Secondary | ICD-10-CM

## 2011-08-10 DIAGNOSIS — Z5112 Encounter for antineoplastic immunotherapy: Secondary | ICD-10-CM

## 2011-08-10 LAB — CBC WITH DIFFERENTIAL/PLATELET
BASO%: 0.5 % (ref 0.0–2.0)
Basophils Absolute: 0 10*3/uL (ref 0.0–0.1)
EOS%: 0.2 % (ref 0.0–7.0)
HCT: 27.6 % — ABNORMAL LOW (ref 34.8–46.6)
HGB: 9.5 g/dL — ABNORMAL LOW (ref 11.6–15.9)
MCH: 31.3 pg (ref 25.1–34.0)
MCHC: 34.4 g/dL (ref 31.5–36.0)
MCV: 90.8 fL (ref 79.5–101.0)
MONO%: 28.3 % — ABNORMAL HIGH (ref 0.0–14.0)
NEUT%: 41.6 % (ref 38.4–76.8)
lymph#: 1.7 10*3/uL (ref 0.9–3.3)

## 2011-08-10 MED ORDER — SODIUM CHLORIDE 0.9 % IJ SOLN
10.0000 mL | INTRAMUSCULAR | Status: DC | PRN
  Administered 2011-08-10: 10 mL
  Filled 2011-08-10: qty 10

## 2011-08-10 MED ORDER — TRASTUZUMAB CHEMO INJECTION 440 MG
2.0000 mg/kg | Freq: Once | INTRAVENOUS | Status: AC
  Administered 2011-08-10: 126 mg via INTRAVENOUS
  Filled 2011-08-10: qty 6

## 2011-08-10 MED ORDER — SODIUM CHLORIDE 0.9 % IV SOLN
Freq: Once | INTRAVENOUS | Status: AC
  Administered 2011-08-10: 13:00:00 via INTRAVENOUS

## 2011-08-10 MED ORDER — SODIUM CHLORIDE 0.45 % IV SOLN
Freq: Once | INTRAVENOUS | Status: AC
  Administered 2011-08-10: 13:00:00 via INTRAVENOUS
  Filled 2011-08-10: qty 1000

## 2011-08-10 MED ORDER — ACETAMINOPHEN 325 MG PO TABS
650.0000 mg | ORAL_TABLET | Freq: Once | ORAL | Status: AC
  Administered 2011-08-10: 650 mg via ORAL

## 2011-08-10 MED ORDER — TRAZODONE HCL 50 MG PO TABS: 50.0000 mg | ORAL_TABLET | Freq: Every day | ORAL | Status: DC

## 2011-08-10 MED ORDER — HEPARIN SOD (PORK) LOCK FLUSH 100 UNIT/ML IV SOLN
500.0000 [IU] | Freq: Once | INTRAVENOUS | Status: AC | PRN
  Administered 2011-08-10: 500 [IU]
  Filled 2011-08-10: qty 5

## 2011-08-10 MED ORDER — DIPHENHYDRAMINE HCL 50 MG/ML IJ SOLN
25.0000 mg | Freq: Once | INTRAMUSCULAR | Status: AC
  Administered 2011-08-10: 25 mg via INTRAVENOUS

## 2011-08-10 NOTE — Progress Notes (Signed)
66 year old female. 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  Seen in [facility] on 05/12/11 by Dr. Michell Heinrich. Bilateral breast carcinoma now being treated in neoadjuvant setting by Dr. Donnie Coffin. Infiltrating ductal carcinoma of the right breast ER,PR, HER2 positive with positive axillary lymph node involvement and infiltrating ductal carcinoma of the left breast, ER,PR positive but, HER2 negative. Patient interested in reconstruction.      AV:[id number], adhesive tape, diclofenac, naproxen No previous radiation therapy No indication of a pacemaker

## 2011-08-10 NOTE — Progress Notes (Signed)
Hematology and Oncology Follow Up Visit  MAYKAYLA HIGHLEY [id number] [birth year] 66 y.o. 08/10/11   HPI: Mrs. Antonetti is a 66 year old [place] woman with newly diagnosed bilateral breast carcinoma now being treated in the neoadjuvant setting, due for week 2 cycle 4/6 q3week TCH, with weekly Herceptin and Neulasta support on day 2.   -Infiltrating ductal carcinoma of the right breast, ER/PR/HER-2 positive with positive axillary lymph node involvement   -Infiltrating ductal carcinoma the left breast, ER/PR positive HER-2 negative.  Interim History:   Mrs. Bartosiewicz is seen today with her husband in accompaniment in anticipation of week 2 cycle 4/6 planned every 3 week doses of neoadjuvant TCH, which will be single agent Hercaptin. Overall, she is actually feeling pretty well denying any unexplained fevers, chills, or night sweats. She's continuing to have no difficulty with nausea, but admits to some decreased fluid intake. Prior constipation issues have resolved. She had experienced profound fatigue but this has improved.  No neuropathy symptoms, nailbed sensitivity, or excessive eye tearing.   A detailed review of systems is otherwise noncontributory as noted below.  Review of Systems: Constitutional:  no weight loss, fever, night sweats and fatigued Eyes: uses glasses ENT: no complaints Cardiovascular: no chest pain or dyspnea on exertion Respiratory: no cough, shortness of breath, or wheezing Neurological: no TIA or stroke symptoms Dermatological: History of mycosis fungoides. Gastrointestinal: no abdominal pain, change in bowel habits, or black or bloody stools Genito-Urinary: no dysuria, trouble voiding, or hematuria Hematological and Lymphatic: negative Breast: known bilateral breast cancer Musculoskeletal: negative Remaining ROS negative.   Medications:   I have reviewed the patient's current medications.  Current Outpatient Prescriptions  Medication Sig Dispense  Refill  . ALPRAZolam (XANAX) 0.25 MG tablet Take 1 tablet (0.25 mg total) by mouth at bedtime as needed.  60 tablet  1  . calcium carbonate (OS-CAL) 600 MG TABS Take 600 mg by mouth 3 (three) times daily with meals.      . Cholecalciferol (VITAMIN D3 PO) Take 1,200 mg by mouth daily.      [name] dexamethasone (DECADRON) 4 MG tablet Take 2 tablets two times a day the day before Taxotere. Then take 2 tabs two times a day starting the day after chemo for 3 days.  30 tablet  1  . lidocaine-prilocaine (EMLA) cream APPLY TO PORT A CATH AS DIRECTED BEFORE CHEMO TREATMENT.  30 g  3  . ondansetron (ZOFRAN) 8 MG tablet Take 1 tablet two times a day starting the day after chemo for 3 days. Then take 1 tab two times a day as needed for nausea or vomiting.  30 tablet  1  . prochlorperazine (COMPAZINE) 10 MG tablet Take 1 tablet (10 mg total) by mouth every 6 (six) hours as needed (Nausea or vomiting).  30 tablet  1  . prochlorperazine (COMPAZINE) 25 MG suppository Place 1 suppository (25 mg total) rectally every 12 (twelve) hours as needed for nausea.  12 suppository  3   No current facility-administered medications for this visit.   Facility-Administered Medications Ordered in Other Visits  Medication Dose Route Frequency Provider Last Rate Last Dose  . lidocaine-prilocaine (EMLA) cream   Topical Once Pierce Crane, MD        Allergies:  Allergies  Allergen Reactions  . Latex Dermatitis and Rash  . Adhesive (Tape)     ALLERGIC TO OP-SITE......USE OP-SITE FLEXIGRID INSTEAD !!!  . Diclofenac Other (See Comments)    Fever, chills  . Naproxen Other (See  Comments)    Fever, chills    Physical Exam: Filed Vitals:   08/10/11 1107  BP: 108/74  Pulse: 93  Temp: 97.5 F (36.4 C)    Body mass index is 24.69 kg/(m^2). Weight: 126 lbs. HEENT:  Sclerae anicteric, conjunctivae pink.  Oropharynx clear.  No mucositis or candidiasis.   Nodes:  No cervical, supraclavicular, or axillary lymphadenopathy palpated.    Breast Exam: Deferred. Lungs:  Clear to auscultation bilaterally.  No crackles, rhonchi, or wheezes.   Heart:  Regular rate and rhythm.   Abdomen:  Soft, nontender.  Positive bowel sounds.  No organomegaly or masses palpated.   Musculoskeletal:  No focal spinal tenderness to palpation.  Extremities:  Benign.  No peripheral edema or cyanosis.   Skin:  Benign, no PPE changes. Neuro:  Nonfocal, alert and oriented x 3.   Lab Results: Lab Results  Component Value Date   WBC 5.9 08/10/2011   HGB 9.5* 08/10/2011   HCT 27.6* 08/10/2011   MCV 90.8 08/10/2011   PLT 219 08/10/2011   NEUTROABS 2.4 08/10/2011     Chemistry      Component Value Date/Time   NA 138 08/03/2011 1125   K 3.6 08/03/2011 1125   CL 103 08/03/2011 1125   CO2 25 08/03/2011 1125   BUN 23 08/03/2011 1125   CREATININE 0.70 08/03/2011 1125      Component Value Date/Time   CALCIUM 9.5 08/03/2011 1125   ALKPHOS 79 08/03/2011 1125   AST 31 08/03/2011 1125   ALT 53* 08/03/2011 1125   BILITOT 0.4 08/03/2011 1125      Lab Results  Component Value Date   LABCA2 25 05/12/2011     Assessment:  Mrs. Wiseman is a 66 year old [place] woman with newly diagnosed bilateral breast carcinoma now being treated in the neoadjuvant setting, due for week 2 cycle 4/6 q3week TCH, with weekly Herceptin and Neulasta support on day 2.   -Infiltrating ductal carcinoma of the right breast, ER/PR/HER-2 positive with positive axillary lymph node involvement   -Infiltrating ductal carcinoma the left breast, ER/PR positive HER-2 negative.   Case reviewed with Dr. Pierce Crane.  Plan:  Mrs. Rudden receive week 2, cycle 4/6 planned neoadjuvant [facility] which is weekly Herceptin.  She will return in one week's time with labs prior, for week 3 cycle 4/6 every three week [facility] which will be single agent Herceptin.  On 08/24/11, she will  Be seen prior to her receiving week 1 cycle 5 of TCH, along with a repeat 2D echocardiogram being obtained.  She and her  husband know that they can feel free to contact us prior to scheduled follow up if the need should arise.  This plan was reviewed with the patient, who voices understanding and agreement.  She knows to call with any changes or problems.    Shreeya Recendiz T, PA-C 08/10/11

## 2011-08-10 NOTE — Telephone Encounter (Signed)
Per stafff message appts in.  "Kirsten Gibson"

## 2011-08-10 NOTE — Telephone Encounter (Signed)
Sent Kirsten Gibson email to set up patient treatment stated to the patient that  I will bring the calendar to her in the treatment room scheduled patient echo on 08-20-2011 at 10:00am at [facility]

## 2011-08-10 NOTE — Patient Instructions (Signed)
Call md for problems 

## 2011-08-11 ENCOUNTER — Ambulatory Visit
Admission: RE | Admit: 2011-08-11 | Discharge: 2011-08-11 | Disposition: A | Discharge status: Home / Self Care | Payer: Federal, State, Local not specified - PPO | Source: Ambulatory Visit | Attending: Radiation Oncology | Admitting: Radiation Oncology

## 2011-08-11 ENCOUNTER — Encounter: Payer: Self-pay | Admitting: Radiation Oncology

## 2011-08-11 VITALS — BP 123/73 | HR 88 | Temp 97.5°F | Resp 18 | Ht 60.0 in | Wt 128.8 lb

## 2011-08-11 DIAGNOSIS — C50919 Malignant neoplasm of unspecified site of unspecified female breast: Secondary | ICD-10-CM | POA: Insufficient documentation

## 2011-08-11 DIAGNOSIS — Z170 Estrogen receptor positive status [ER+]: Secondary | ICD-10-CM | POA: Insufficient documentation

## 2011-08-11 DIAGNOSIS — Z79899 Other long term (current) drug therapy: Secondary | ICD-10-CM | POA: Insufficient documentation

## 2011-08-11 DIAGNOSIS — C50912 Malignant neoplasm of unspecified site of left female breast: Secondary | ICD-10-CM

## 2011-08-11 DIAGNOSIS — C50419 Malignant neoplasm of upper-outer quadrant of unspecified female breast: Secondary | ICD-10-CM

## 2011-08-11 NOTE — Progress Notes (Signed)
Patient presents to the clinic today accompanied by her husband for a consultation with Dr. Michell Heinrich reference bilateral breast ca. Patient is alert and oriented to person, place, and time. No distress noted. Steady gait noted. Pleasant affect noted. Anxiety noted. Patient denies pain at this time. Patient states when asked how she is doing today,"well I am a cancer patient so not well." Patient here today to "obtain more information" so that she can make an informed decision about mastectomy vs. lumpectomy prior to appointment with [facility] on 08/13/2011. Patient states,"if I knew that I could get reconstruction following bilateral mastectomy that would be the way I would go." Patient reports that prior to chemotherapy she weighed 145 but, weighs in today at 128. Patient reports weight loss is associated with taste changes and decreased appetite. Patient reports that since chemo she feels very weak and fatigue. Also, patient reports that mild exertion causes her heart to race. Patient reports that tonight she plans to switch for taking xanax as her sleep aid to trazadone. Patient reports that during the night she had an episode of nausea and dry heaves. Patient denies headache, dizziness, diplopia, or diarrhea. Patient scheduled for Herceptin infusion 08/17/2011. Reported all findings to Dr. Michell Heinrich.

## 2011-08-11 NOTE — Addendum Note (Signed)
Encounter addended by: Agnes Lawrence, RN on: 08/11/2011  6:23 PM<BR>     Documentation filed: Charges VN, Notes Section, Inpatient Document Flowsheet, Inpatient Patient Education

## 2011-08-11 NOTE — Progress Notes (Signed)
See progress note under physician encounter. 

## 2011-08-11 NOTE — Progress Notes (Signed)
Complete PATIENT MEASURE OF DISTRESS worksheet with a score of 7 submitted to social work. Patient denies need to see social work today. Also, patient reports an approximate 20 lb weight since the start of chemo related to taste changes and decreased appetite. Flagged Zenovia Jarred, RD in [facility] reference nutritional needs.

## 2011-08-11 NOTE — Progress Notes (Signed)
Department of Radiation Oncology  Phone:  [phone] Fax:        [phone]   Name: Kirsten Gibson   DOB: Jul 17, 1945  MRN: [id number]    Date: 08/11/2011  Follow Up Visit Note  Diagnosis:  T1cN2 invasive ductal carcinoma of the right breast T1cN0 invasive ductal carcinoma of the left breast  Interval History: Kirsten Gibson presents today for routine followup.  When I initially saw her in April she had not had that the other aspect of her right breast biopsied and had not in the left breast biopsy. The second right breast biopsy which is the more anterior biopsy showed invasive ductal carcinoma as well as ductal carcinoma in situ. The left breast biopsy again showed invasive ductal carcinoma with ductal carcinoma in situ. Both of these lesions were ER and PR positive HER-2 negative. He original lesion on the right was HER-2 positive. Her Oncotype score was high and therefore she has moved on with neoadjuvant chemotherapy. He has received 4 out of a planned 6 cycles of TCH. She's been tolerating that relatively well with some fatigue issues. She is scheduled to see Dr. Jamey Ripa on Friday. She had an MRI of the bilateral breasts on 07/26/2011. There is still a 1.6 cm mass in the upper outer quadrant of the right breast. There is been interval improvement in the non-masslike enhancement in the upper outer quadrant of the breast. Now measuring 4.6 cm previously measuring 5 cm. The mass had previously measured 1.9 cm. The area on the left really is unchanged. The right-sided lymph nodes looked about the same and left lymph nodes were slightly prominent in size but also unchanged. She wanted more information about adjuvant radiation and her reconstructive options are to making a final surgical decision P. She and her husband have been doing a lot of reading. She feels though her breast tumor is slightly less prominent. She is having no pain.  Allergies:  Allergies  Allergen Reactions  . Latex Dermatitis  and Rash  . Adhesive (Tape)     ALLERGIC TO OP-SITE......USE OP-SITE FLEXIGRID INSTEAD !!!  . Diclofenac Other (See Comments)    Fever, chills  . Naproxen Other (See Comments)    Fever, chills    Medications:  Current Outpatient Prescriptions  Medication Sig Dispense Refill  . calcium carbonate (OS-CAL) 600 MG TABS Take 600 mg by mouth 3 (three) times daily with meals.      . Cholecalciferol (VITAMIN D3 PO) Take 3,000 mg by mouth daily.       . traZODone (DESYREL) 50 MG tablet Take 1 tablet (50 mg total) by mouth at bedtime.  60 tablet  2  . ALPRAZolam (XANAX) 0.25 MG tablet Take 1 tablet (0.25 mg total) by mouth at bedtime as needed.  60 tablet  1  . dexamethasone (DECADRON) 4 MG tablet Take 2 tablets two times a day the day before Taxotere. Then take 2 tabs two times a day starting the day after chemo for 3 days.  30 tablet  1  . lidocaine-prilocaine (EMLA) cream APPLY TO PORT A CATH AS DIRECTED BEFORE CHEMO TREATMENT.  30 g  3  . ondansetron (ZOFRAN) 8 MG tablet Take 1 tablet two times a day starting the day after chemo for 3 days. Then take 1 tab two times a day as needed for nausea or vomiting.  30 tablet  1  . prochlorperazine (COMPAZINE) 10 MG tablet Take 1 tablet (10 mg total) by mouth every 6 (six) hours as  needed (Nausea or vomiting).  30 tablet  1  . prochlorperazine (COMPAZINE) 25 MG suppository Place 1 suppository (25 mg total) rectally every 12 (twelve) hours as needed for nausea.  12 suppository  3   No current facility-administered medications for this encounter.   Facility-Administered Medications Ordered in Other Encounters  Medication Dose Route Frequency Provider Last Rate Last Dose  . lidocaine-prilocaine (EMLA) cream   Topical Once Pierce Crane, MD      . DISCONTD: sodium chloride 0.9 % injection 10 mL  10 mL Intracatheter PRN Amada Kingfisher, PA   10 mL at 08/10/11 1443    Physical Exam:   height is 5' (1.524 m) and weight is 128 lb 12.8 oz (58.423 kg). Her  oral temperature is 97.5 F (36.4 C). Her blood pressure is 123/73 and her pulse is 88. Her respiration is 18.  He is a pleasant female in no distress sitting comfortably examining table. She does at times become tearful during our conversation. Palpation of her right upper outer quadrant does demonstrate a mass measuring approximately 2 cm just lateral to the nipple. There is abnormalities palpable up towards the axilla consistent with about a 5 cm area palpable normality. She does have medium sized breasts bilaterally.  IMPRESSION: Kirsten Gibson is a 66 y.o. female with bilateral breast cancers  PLAN:  I spoke with Kimmora and her husband at length today. They have multiple questions and I have written her treatment options on patient counseling sheet given them to her. She has the option for bilateral mastectomies at this time. I told her I did not believe she would need radiation on the left it would be a candidate for implant route reconstruction. She does require radiation on the right regardless of whether she has a lumpectomy or mastectomy. I told her that with a mastectomy she would likely have to have autologous to initiate reconstruction. I told her we could also attempt a lumpectomy on the right although the risk of this would be that she would have positive margins because certainly the chemotherapy is not going to bring these 2 clips that are 5 cm apart closer together. The non-masslike enhancement has responded to chemotherapy but it appears that perhaps some of this is not DCIS an in fact an invasive cancer. If she did have a lumpectomy on the right I warned her that she could have a poor cosmetic outcome or positive margins which would require mastectomy. If she did have a mastectomy on the right she would require postmastectomy radiation. On the left she could have a lumpectomy radiation with no plastic surgery or again a mastectomy. She is in a meet with Dr. Jamey Ripa and discuss these issues on Friday.  She was grateful for our conversation. I let her know she does still have 2 more cycles of chemotherapy to go. She would have an MRI at that time and again talked to Dr. Jamey Ripa. I have not scheduled followup with her but would be happy to see her back after she is cleared from Dr. Jamey Ripa. We discussed the use of breath hold as well as CT simulation to decrease side effects of treatment. We will go over this again when she is cleared from a surgical standpoint and is ready to begin.    Lurline Hare, MD

## 2011-08-13 ENCOUNTER — Encounter (INDEPENDENT_AMBULATORY_CARE_PROVIDER_SITE_OTHER): Payer: Self-pay | Admitting: Surgery

## 2011-08-13 ENCOUNTER — Ambulatory Visit (INDEPENDENT_AMBULATORY_CARE_PROVIDER_SITE_OTHER): Payer: Federal, State, Local not specified - PPO | Admitting: Surgery

## 2011-08-13 VITALS — BP 112/76 | HR 88 | Temp 98.7°F | Resp 16 | Ht 60.0 in | Wt 127.0 lb

## 2011-08-13 DIAGNOSIS — C50912 Malignant neoplasm of unspecified site of left female breast: Secondary | ICD-10-CM

## 2011-08-13 DIAGNOSIS — C50419 Malignant neoplasm of upper-outer quadrant of unspecified female breast: Secondary | ICD-10-CM

## 2011-08-13 DIAGNOSIS — K810 Acute cholecystitis: Secondary | ICD-10-CM

## 2011-08-13 NOTE — Patient Instructions (Signed)
We will plan to schedule surgery the first week or two in September. I need to see you the first week in August to review final plans. If you are interested in seeing a plastic surgeon, let me know so we can make arrangements for a consultation

## 2011-08-17 ENCOUNTER — Other Ambulatory Visit (HOSPITAL_BASED_OUTPATIENT_CLINIC_OR_DEPARTMENT_OTHER): Payer: Federal, State, Local not specified - PPO | Admitting: Lab

## 2011-08-17 ENCOUNTER — Other Ambulatory Visit: Payer: Self-pay | Admitting: Physician Assistant

## 2011-08-17 ENCOUNTER — Ambulatory Visit (HOSPITAL_BASED_OUTPATIENT_CLINIC_OR_DEPARTMENT_OTHER): Payer: Federal, State, Local not specified - PPO

## 2011-08-17 VITALS — BP 127/70 | HR 74 | Temp 98.6°F

## 2011-08-17 DIAGNOSIS — C50419 Malignant neoplasm of upper-outer quadrant of unspecified female breast: Secondary | ICD-10-CM

## 2011-08-17 DIAGNOSIS — C50119 Malignant neoplasm of central portion of unspecified female breast: Secondary | ICD-10-CM

## 2011-08-17 DIAGNOSIS — Z5112 Encounter for antineoplastic immunotherapy: Secondary | ICD-10-CM

## 2011-08-17 DIAGNOSIS — C50912 Malignant neoplasm of unspecified site of left female breast: Secondary | ICD-10-CM

## 2011-08-17 DIAGNOSIS — C773 Secondary and unspecified malignant neoplasm of axilla and upper limb lymph nodes: Secondary | ICD-10-CM

## 2011-08-17 LAB — CBC WITH DIFFERENTIAL/PLATELET
BASO%: 0.1 % (ref 0.0–2.0)
Eosinophils Absolute: 0 10*3/uL (ref 0.0–0.5)
HCT: 23.6 % — ABNORMAL LOW (ref 34.8–46.6)
MCHC: 33.9 g/dL (ref 31.5–36.0)
MONO#: 0.8 10*3/uL (ref 0.1–0.9)
NEUT#: 4.8 10*3/uL (ref 1.5–6.5)
NEUT%: 61.7 % (ref 38.4–76.8)
WBC: 7.8 10*3/uL (ref 3.9–10.3)
lymph#: 2.2 10*3/uL (ref 0.9–3.3)
nRBC: 0 % (ref 0–0)

## 2011-08-17 MED ORDER — ACETAMINOPHEN 325 MG PO TABS
650.0000 mg | ORAL_TABLET | Freq: Once | ORAL | Status: AC
  Administered 2011-08-17: 650 mg via ORAL

## 2011-08-17 MED ORDER — SODIUM CHLORIDE 0.9 % IV SOLN
Freq: Once | INTRAVENOUS | Status: AC
  Administered 2011-08-17: 11:00:00 via INTRAVENOUS

## 2011-08-17 MED ORDER — TRASTUZUMAB CHEMO INJECTION 440 MG
2.0000 mg/kg | Freq: Once | INTRAVENOUS | Status: AC
  Administered 2011-08-17: 126 mg via INTRAVENOUS
  Filled 2011-08-17: qty 6

## 2011-08-17 NOTE — Progress Notes (Signed)
Notified MD of patients Hgb 8.0 and platelets 74.  Patient denies SOB, fast heart rate, abnormal bleeding.  Only c/o is being tired but "no more than usual".  OK to proceed with treatment

## 2011-08-17 NOTE — Progress Notes (Signed)
Chief complaint: Bilateral breast cancer, undergoing neoadjuvant chemotherapy.  History of present illness: This patient was diagnosed several months ago with a fairly large right breast cancer upper-outer quadrant with axillary metastasis. Her further workup showed a small left breast cancer. She is currently undergoing chemotherapy. The issue of the right side was that there were 2 separate areas of placed 5 cm apart and there is concern about our technical ability to get a good lumpectomy with adequate margins and still have a cosmetically acceptable result. She has 2 chemotherapy treatments left to do. She has seen the radiation oncologist, Dr. Odis Luster discussed radiation therapy issues. It was thought that the left side was amenable to a lumpectomy followed by radiation therapy. We might be do a lumpectomy on the right followed by radiation therapy but she would need radiation therapy even if she were to have a mastectomy on the right because of the volume of tumor at the initial diagnosis.  Past history family history review of systems are all contained in the apical electronic medical record and not redictated in this note.  Exam: Gen.: The patient alert, anxious, but in no distress. Vital signs:BP 112/76  Pulse 88  Temp 98.7 F (37.1 C) (Temporal)  Resp 16  Ht 5' (1.524 m)  Wt 127 lb (57.607 kg)  BMI 24.80 kg/m2  Breasts: The right breast continues to have a mass in the upper outer quadrant consistent with her known breast cancer. It is about 50% smaller than my original note. I do not appreciate any adenopathy today.  Impression: Bilateral breast cancer undergoing neoadjuvant chemotherapy.  Plan:I had a very long (30-40 minutes) discussion with the patient and her husband. We addressed initially the left side which I think can be satisfactorily managed with a lumpectomy and sentinel node evaluation followed by radiation therapy. If she wished, she could have a mastectomy and  likely an immediate reconstruction. We discussed this at length and the fact that recurrences distally are the more worrisome issue and that any local recurrence after lumpectomy could be subsequently managed with mastectomy. If she were to have a mastectomy, she would not need to have radiation therapy unless this tumor is bigger than we think or there are multiple lymph nodes involved.  The right side is more difficult discussion. From my perspective, the most straightforward thing to do to be a modified mastectomy so that we would manage the lymph nodes in the primary tumor at the same time. She has shown some shrinkage of the primary by physical examination and by MRI report but there still a fairly large area that'll need to be removed it is likely have to be done with brackets. I still concerned that we would not have good local control with good margins. However, it does go back and do a mastectomy if we fail to get adequate margins at the initial surgery. I explained that if we were to do a mastectomy to be a single incision which would include the lymph nodes. If we were to do a lumpectomy she would need a second incision for the lymph node dissection. Any indication of need to have drains placed. Should not be a candidate for immediate reconstruction because of the need for postoperative radiation.  Despite this lengthy discussion I am not certain the patient has a good grasp of the issues and she is clearly not ready to to make a definitive decision.We have given her the names of plastic surgeons to discuss issues with. In addition  we can see her back again after her next chemotherapy and she has had further timeTo reflect on the various issues.

## 2011-08-20 ENCOUNTER — Ambulatory Visit (HOSPITAL_COMMUNITY)
Admission: RE | Admit: 2011-08-20 | Discharge: 2011-08-20 | Disposition: A | Discharge status: Home / Self Care | Payer: Federal, State, Local not specified - PPO | Source: Ambulatory Visit | Attending: Physician Assistant | Admitting: Physician Assistant

## 2011-08-20 DIAGNOSIS — C50919 Malignant neoplasm of unspecified site of unspecified female breast: Secondary | ICD-10-CM | POA: Insufficient documentation

## 2011-08-20 DIAGNOSIS — Z09 Encounter for follow-up examination after completed treatment for conditions other than malignant neoplasm: Secondary | ICD-10-CM

## 2011-08-20 DIAGNOSIS — K819 Cholecystitis, unspecified: Secondary | ICD-10-CM | POA: Insufficient documentation

## 2011-08-20 DIAGNOSIS — C50419 Malignant neoplasm of upper-outer quadrant of unspecified female breast: Secondary | ICD-10-CM | POA: Insufficient documentation

## 2011-08-20 DIAGNOSIS — C50912 Malignant neoplasm of unspecified site of left female breast: Secondary | ICD-10-CM

## 2011-08-20 DIAGNOSIS — Z87891 Personal history of nicotine dependence: Secondary | ICD-10-CM | POA: Insufficient documentation

## 2011-08-20 NOTE — Progress Notes (Signed)
  Echocardiogram 2D Echocardiogram has been performed.  Kirsten Gibson 08/20/2011, 10:42 AM

## 2011-08-23 ENCOUNTER — Encounter: Payer: Self-pay | Admitting: Physician Assistant

## 2011-08-23 ENCOUNTER — Telehealth: Payer: Self-pay | Admitting: *Deleted

## 2011-08-23 ENCOUNTER — Other Ambulatory Visit: Payer: Federal, State, Local not specified - PPO | Admitting: Lab

## 2011-08-23 ENCOUNTER — Ambulatory Visit (HOSPITAL_BASED_OUTPATIENT_CLINIC_OR_DEPARTMENT_OTHER): Payer: Federal, State, Local not specified - PPO | Admitting: Physician Assistant

## 2011-08-23 ENCOUNTER — Other Ambulatory Visit: Payer: Self-pay | Admitting: *Deleted

## 2011-08-23 VITALS — BP 151/71 | HR 79 | Temp 97.8°F | Ht 60.0 in | Wt 128.3 lb

## 2011-08-23 DIAGNOSIS — C50419 Malignant neoplasm of upper-outer quadrant of unspecified female breast: Secondary | ICD-10-CM

## 2011-08-23 DIAGNOSIS — D6481 Anemia due to antineoplastic chemotherapy: Secondary | ICD-10-CM

## 2011-08-23 DIAGNOSIS — C50912 Malignant neoplasm of unspecified site of left female breast: Secondary | ICD-10-CM

## 2011-08-23 DIAGNOSIS — T451X5A Adverse effect of antineoplastic and immunosuppressive drugs, initial encounter: Secondary | ICD-10-CM

## 2011-08-23 DIAGNOSIS — C50919 Malignant neoplasm of unspecified site of unspecified female breast: Secondary | ICD-10-CM

## 2011-08-23 DIAGNOSIS — K810 Acute cholecystitis: Secondary | ICD-10-CM

## 2011-08-23 HISTORY — DX: Anemia due to antineoplastic chemotherapy: D64.81

## 2011-08-23 LAB — CBC WITH DIFFERENTIAL/PLATELET
Basophils Absolute: 0 10*3/uL (ref 0.0–0.1)
HCT: 23.2 % — ABNORMAL LOW (ref 34.8–46.6)
HGB: 7.9 g/dL — ABNORMAL LOW (ref 11.6–15.9)
LYMPH%: 11.7 % — ABNORMAL LOW (ref 14.0–49.7)
MONO#: 0.2 10*3/uL (ref 0.1–0.9)
NEUT%: 85 % — ABNORMAL HIGH (ref 38.4–76.8)
Platelets: 40 10*3/uL — ABNORMAL LOW (ref 145–400)
WBC: 5.6 10*3/uL (ref 3.9–10.3)
lymph#: 0.7 10*3/uL — ABNORMAL LOW (ref 0.9–3.3)

## 2011-08-23 LAB — COMPREHENSIVE METABOLIC PANEL
BUN: 10 mg/dL (ref 6–23)
CO2: 27 mEq/L (ref 19–32)
Calcium: 8.5 mg/dL (ref 8.4–10.5)
Chloride: 104 mEq/L (ref 96–112)
Creatinine, Ser: 0.65 mg/dL (ref 0.50–1.10)
Glucose, Bld: 108 mg/dL — ABNORMAL HIGH (ref 70–99)
Total Bilirubin: 0.4 mg/dL (ref 0.3–1.2)

## 2011-08-23 LAB — LACTATE DEHYDROGENASE: LDH: 254 U/L — ABNORMAL HIGH (ref 94–250)

## 2011-08-23 MED ORDER — DARBEPOETIN ALFA-POLYSORBATE 300 MCG/0.6ML IJ SOLN
300.0000 ug | Freq: Once | INTRAMUSCULAR | Status: AC
  Administered 2011-08-23: 300 ug via SUBCUTANEOUS
  Filled 2011-08-23: qty 0.6

## 2011-08-23 NOTE — Progress Notes (Signed)
Hematology and Oncology Follow Up Visit  XCARET MORAD [id number] 12-13-1945 66 y.o. 08/23/11   HPI: Mrs. Takach is a 66 year old [place] woman with newly diagnosed bilateral breast carcinoma now being treated in the neoadjuvant setting, due for week 2 cycle 4/6 q3week TCH, with weekly Herceptin and Neulasta support on day 2.   -Infiltrating ductal carcinoma of the right breast, ER/PR/HER-2 positive with positive axillary lymph node involvement   -Infiltrating ductal carcinoma the left breast, ER/PR positive HER-2 negative.  Interim History:   Mrs. Weinmann is seen today with her husband in accompaniment in anticipation of week 1 cycle 5/6 planned every 3 week doses of neoadjuvant TCH, which will be single agent Hercaptin. Overall, she is actually feeling pretty well denying any unexplained fevers, chills, or night sweats.  She had experienced profound fatigue but this has improved.  No neuropathy symptoms, nailbed sensitivity, or excessive eye tearing.   A detailed review of systems is otherwise noncontributory as noted below.  Review of Systems: Constitutional: fatigued Eyes: uses glasses ENT: no complaints Cardiovascular: no chest pain or dyspnea on exertion Respiratory: no cough, shortness of breath, or wheezing Neurological: no TIA or stroke symptoms Dermatological: History of mycosis fungoides. Gastrointestinal: no abdominal pain, change in bowel habits, or black or bloody stools Genito-Urinary: no dysuria, trouble voiding, or hematuria Hematological and Lymphatic: negative Breast: known bilateral breast cancer Musculoskeletal: negative Remaining ROS negative.   Medications:   I have reviewed the patient's current medications.  Current Outpatient Prescriptions  Medication Sig Dispense Refill  . ALPRAZolam (XANAX) 0.25 MG tablet Take 1 tablet (0.25 mg total) by mouth at bedtime as needed.  60 tablet  1  . calcium carbonate (OS-CAL) 600 MG TABS Take 600 mg by  mouth 3 (three) times daily with meals.      . Cholecalciferol (VITAMIN D3 PO) Take 3,000 mg by mouth daily.       [name] dexamethasone (DECADRON) 4 MG tablet Take 2 tablets two times a day the day before Taxotere. Then take 2 tabs two times a day starting the day after chemo for 3 days.  30 tablet  1  . lidocaine-prilocaine (EMLA) cream APPLY TO PORT A CATH AS DIRECTED BEFORE CHEMO TREATMENT.  30 g  3  . ondansetron (ZOFRAN) 8 MG tablet Take 1 tablet two times a day starting the day after chemo for 3 days. Then take 1 tab two times a day as needed for nausea or vomiting.  30 tablet  1  . prochlorperazine (COMPAZINE) 10 MG tablet Take 1 tablet (10 mg total) by mouth every 6 (six) hours as needed (Nausea or vomiting).  30 tablet  1  . prochlorperazine (COMPAZINE) 25 MG suppository Place 1 suppository (25 mg total) rectally every 12 (twelve) hours as needed for nausea.  12 suppository  3  . traZODone (DESYREL) 50 MG tablet Take 1 tablet (50 mg total) by mouth at bedtime.  60 tablet  2   Current Facility-Administered Medications  Medication Dose Route Frequency Provider Last Rate Last Dose  . darbepoetin (ARANESP) injection 300 mcg  300 mcg Subcutaneous Once Amada Kingfisher, PA   300 mcg at 08/23/11 1249   Facility-Administered Medications Ordered in Other Visits  Medication Dose Route Frequency Provider Last Rate Last Dose  . lidocaine-prilocaine (EMLA) cream   Topical Once Pierce Crane, MD        Allergies:  Allergies  Allergen Reactions  . Latex Dermatitis and Rash  . Adhesive (Tape)  ALLERGIC TO OP-SITE......USE OP-SITE FLEXIGRID INSTEAD !!!  . Diclofenac Other (See Comments)    Fever, chills  . Naproxen Other (See Comments)    Fever, chills    Physical Exam: Filed Vitals:   08/23/11 1158  BP: 151/71  Pulse: 79  Temp: 97.8 F (36.6 C)    Body mass index is 25.06 kg/(m^2). Weight: 128 lbs. HEENT:  Sclerae anicteric, conjunctivae pale.  Oropharynx clear.  No mucositis or  candidiasis.   Nodes:  No cervical, supraclavicular, or axillary lymphadenopathy palpated.  Breast Exam: Deferred. Lungs:  Clear to auscultation bilaterally.  No crackles, rhonchi, or wheezes.   Heart:  Regular rate and rhythm.   Abdomen:  Soft, nontender.  Positive bowel sounds.  No organomegaly or masses palpated.   Musculoskeletal:  No focal spinal tenderness to palpation.  Extremities:  Benign.  No peripheral edema or cyanosis.   Skin:  Benign, no PPE changes. Neuro:  Nonfocal, alert and oriented x 3.   Lab Results: Lab Results  Component Value Date   WBC 5.6 08/23/2011   HGB 7.9* 08/23/2011   HCT 23.2* 08/23/2011   MCV 98.5 08/23/2011   PLT 40* 08/23/2011   NEUTROABS 4.8 08/23/2011     Chemistry      Component Value Date/Time   NA 138 08/03/2011 1125   K 3.6 08/03/2011 1125   CL 103 08/03/2011 1125   CO2 25 08/03/2011 1125   BUN 23 08/03/2011 1125   CREATININE 0.70 08/03/2011 1125      Component Value Date/Time   CALCIUM 9.5 08/03/2011 1125   ALKPHOS 79 08/03/2011 1125   AST 31 08/03/2011 1125   ALT 53* 08/03/2011 1125   BILITOT 0.4 08/03/2011 1125      Lab Results  Component Value Date   LABCA2 25 05/12/2011   2D Echocardiogram: 08/20/11 Study Conclusions Left ventricle: The cavity size was normal. Wall thickness was normal. Systolic function was normal. The estimated ejection fraction was in the range of 60% to 65%. Transthoracic echocardiography. M-mode, limited 2D, spectral Doppler, and color Doppler. Height: Height: 152.4cm. Height: 60in. Weight: Weight: 57.6kg. Weight: 126.7lb. Body mass index: BMI: 24.8kg/m^2. Body surface area: BSA: 1.36m^2. Blood pressure: 138/74. Patient status: Outpatient. Location: Echo laboratory. Prepared and Electronically Authenticated by Kristeen Miss    Assessment:  Mrs. Lichty is a 66 year old [place] woman with newly diagnosed bilateral breast carcinoma now being treated in the neoadjuvant setting, due for week 2  cycle 4/6 q3week TCH, with weekly Herceptin and Neulasta support on day 2.   -Infiltrating ductal carcinoma of the right breast, ER/PR/HER-2 positive with positive axillary lymph node involvement   -Infiltrating ductal carcinoma the left breast, ER/PR positive HER-2 negative.  2. Asymptomatic anemia and thrombocytopenia, chemotherapy induced.   Case reviewed with Dr. Pierce Crane.  Plan:  Mrs. Dowse will be delayed 1 week due to chemotherapy induced anemia and thrombocytopenia.  After discussion with Dr. Donnie Coffin, patient has given informed consent to receive Aranesp injection today, and every three weeks if indicated.  Written consent signed.  Will will recheck a CBC on 08/26/11, and plan on her returning in one week's time with labs prior, for week 1 cycle 5/6 every three week [facility]   She and her husband know that they can feel free to contact us prior to scheduled follow up if the need should arise.  This plan was reviewed with the patient, who voices understanding and agreement.  She knows to call with any changes or problems.  Maiah Sinning T, PA-C 08/23/11

## 2011-08-23 NOTE — Telephone Encounter (Signed)
sent michelle email to adjust the treatment on 08-31-2011 to three hours from two hours

## 2011-08-23 NOTE — Telephone Encounter (Signed)
Per staff message and POF I have changed 7/23 treatment appt from 2hrs to 3hrs.  JMW

## 2011-08-24 ENCOUNTER — Ambulatory Visit: Payer: Federal, State, Local not specified - PPO

## 2011-08-25 ENCOUNTER — Ambulatory Visit: Payer: Federal, State, Local not specified - PPO

## 2011-08-26 ENCOUNTER — Other Ambulatory Visit (HOSPITAL_BASED_OUTPATIENT_CLINIC_OR_DEPARTMENT_OTHER): Payer: Federal, State, Local not specified - PPO | Admitting: Lab

## 2011-08-26 ENCOUNTER — Ambulatory Visit (HOSPITAL_BASED_OUTPATIENT_CLINIC_OR_DEPARTMENT_OTHER): Payer: Federal, State, Local not specified - PPO | Admitting: Physician Assistant

## 2011-08-26 VITALS — BP 130/76 | HR 82 | Temp 98.2°F | Ht 60.0 in | Wt 127.3 lb

## 2011-08-26 DIAGNOSIS — C773 Secondary and unspecified malignant neoplasm of axilla and upper limb lymph nodes: Secondary | ICD-10-CM

## 2011-08-26 DIAGNOSIS — C50912 Malignant neoplasm of unspecified site of left female breast: Secondary | ICD-10-CM

## 2011-08-26 DIAGNOSIS — C50419 Malignant neoplasm of upper-outer quadrant of unspecified female breast: Secondary | ICD-10-CM

## 2011-08-26 DIAGNOSIS — T451X5A Adverse effect of antineoplastic and immunosuppressive drugs, initial encounter: Secondary | ICD-10-CM

## 2011-08-26 DIAGNOSIS — C50119 Malignant neoplasm of central portion of unspecified female breast: Secondary | ICD-10-CM

## 2011-08-26 DIAGNOSIS — C50911 Malignant neoplasm of unspecified site of right female breast: Secondary | ICD-10-CM

## 2011-08-26 DIAGNOSIS — D6481 Anemia due to antineoplastic chemotherapy: Secondary | ICD-10-CM

## 2011-08-26 LAB — CBC WITH DIFFERENTIAL/PLATELET
Basophils Absolute: 0 10*3/uL (ref 0.0–0.1)
EOS%: 0.4 % (ref 0.0–7.0)
Eosinophils Absolute: 0 10*3/uL (ref 0.0–0.5)
HCT: 23.8 % — ABNORMAL LOW (ref 34.8–46.6)
HGB: 8 g/dL — ABNORMAL LOW (ref 11.6–15.9)
MCH: 32.5 pg (ref 25.1–34.0)
MONO#: 0.6 10*3/uL (ref 0.1–0.9)
NEUT#: 2.6 10*3/uL (ref 1.5–6.5)
RDW: 20.5 % — ABNORMAL HIGH (ref 11.2–14.5)
WBC: 5.1 10*3/uL (ref 3.9–10.3)
lymph#: 1.9 10*3/uL (ref 0.9–3.3)

## 2011-08-26 NOTE — Progress Notes (Signed)
Hematology and Oncology Follow Up Visit  Kirsten Gibson [id number] 1945-03-22 66 y.o. 08/26/11  HPI: Kirsten Gibson is a 66 year old [place] woman with newly diagnosed bilateral breast carcinoma now being treated in the neoadjuvant setting, due for week 2 cycle 4/6 q3week TCH, with weekly Herceptin and Neulasta support on day 2.   -Infiltrating ductal carcinoma of the right breast, ER/PR/HER-2 positive with positive axillary lymph node involvement   -Infiltrating ductal carcinoma the left breast, ER/PR positive HER-2 negative.  Interim History:   Kirsten Gibson is seen today with her husband in accompaniment for brief follow up after delaying week 1 cycle 5/6 planned every 3 week doses of neoadjuvant TCH, due to thrombocytopenia and chemotherapy induced anemia.  She tolerated Aranesp without complaints. Overall, she is actually feeling pretty well denying any unexplained fevers, chills, or night sweats.  Fatigue  has improved.  No neuropathy symptoms, nailbed sensitivity, or excessive eye tearing.   A detailed review of systems is otherwise noncontributory as noted below.  Review of Systems: Constitutional: fatigued Eyes: uses glasses ENT: no complaints Cardiovascular: no chest pain or dyspnea on exertion Respiratory: no cough, shortness of breath, or wheezing Neurological: no TIA or stroke symptoms Dermatological: History of mycosis fungoides. Gastrointestinal: no abdominal pain, change in bowel habits, or black or bloody stools Genito-Urinary: no dysuria, trouble voiding, or hematuria Hematological and Lymphatic: negative Breast: known bilateral breast cancer Musculoskeletal: negative Remaining ROS negative.   Medications:   I have reviewed the patient's current medications.  Current Outpatient Prescriptions  Medication Sig Dispense Refill  . ALPRAZolam (XANAX) 0.25 MG tablet Take 1 tablet (0.25 mg total) by mouth at bedtime as needed.  60 tablet  1  . calcium  carbonate (OS-CAL) 600 MG TABS Take 600 mg by mouth 3 (three) times daily with meals.      . Cholecalciferol (VITAMIN D3 PO) Take 3,000 mg by mouth daily.       [name] dexamethasone (DECADRON) 4 MG tablet Take 2 tablets two times a day the day before Taxotere. Then take 2 tabs two times a day starting the day after chemo for 3 days.  30 tablet  1  . lidocaine-prilocaine (EMLA) cream APPLY TO PORT A CATH AS DIRECTED BEFORE CHEMO TREATMENT.  30 g  3  . ondansetron (ZOFRAN) 8 MG tablet Take 1 tablet two times a day starting the day after chemo for 3 days. Then take 1 tab two times a day as needed for nausea or vomiting.  30 tablet  1  . prochlorperazine (COMPAZINE) 10 MG tablet Take 1 tablet (10 mg total) by mouth every 6 (six) hours as needed (Nausea or vomiting).  30 tablet  1  . prochlorperazine (COMPAZINE) 25 MG suppository Place 1 suppository (25 mg total) rectally every 12 (twelve) hours as needed for nausea.  12 suppository  3  . traZODone (DESYREL) 50 MG tablet Take 1 tablet (50 mg total) by mouth at bedtime.  60 tablet  2   No current facility-administered medications for this visit.   Facility-Administered Medications Ordered in Other Visits  Medication Dose Route Frequency Provider Last Rate Last Dose  . lidocaine-prilocaine (EMLA) cream   Topical Once Pierce Crane, MD        Allergies:  Allergies  Allergen Reactions  . Latex Dermatitis and Rash  . Adhesive (Tape)     ALLERGIC TO OP-SITE......USE OP-SITE FLEXIGRID INSTEAD !!!  . Diclofenac Other (See Comments)    Fever, chills  . Naproxen Other (  See Comments)    Fever, chills    Physical Exam: Filed Vitals:   08/26/11 1034  BP: 130/76  Pulse: 82  Temp: 98.2 F (36.8 C)    Body mass index is 24.86 kg/(m^2). Weight: 127 lbs.  Full exam deferred-see note from 08/23/11    Lab Results: Lab Results  Component Value Date   WBC 5.1 08/26/2011   HGB 8.0* 08/26/2011   HCT 23.8* 08/26/2011   MCV 96.7 08/26/2011   PLT 102* 08/26/2011    NEUTROABS 2.6 08/26/2011     Chemistry      Component Value Date/Time   NA 139 08/23/2011 1122   K 3.8 08/23/2011 1122   CL 104 08/23/2011 1122   CO2 27 08/23/2011 1122   BUN 10 08/23/2011 1122   CREATININE 0.65 08/23/2011 1122      Component Value Date/Time   CALCIUM 8.5 08/23/2011 1122   ALKPHOS 68 08/23/2011 1122   AST 33 08/23/2011 1122   ALT 33 08/23/2011 1122   BILITOT 0.4 08/23/2011 1122      Lab Results  Component Value Date   LABCA2 25 05/12/2011   2D Echocardiogram: 08/20/11 Study Conclusions Left ventricle: The cavity size was normal. Wall thickness was normal. Systolic function was normal. The estimated ejection fraction was in the range of 60% to 65%. Transthoracic echocardiography. M-mode, limited 2D, spectral Doppler, and color Doppler. Height: Height: 152.4cm. Height: 60in. Weight: Weight: 57.6kg. Weight: 126.7lb. Body mass index: BMI: 24.8kg/m^2. Body surface area: BSA: 1.73m^2. Blood pressure: 138/74. Patient status: Outpatient. Location: Echo laboratory. Prepared and Electronically Authenticated by Kristeen Miss    Assessment:  Kirsten Gibson is a 66 year old [place] woman with newly diagnosed bilateral breast carcinoma now being treated in the neoadjuvant setting, due for week 2 cycle 4/6 q3week TCH, with weekly Herceptin and Neulasta support on day 2.   -Infiltrating ductal carcinoma of the right breast, ER/PR/HER-2 positive with positive axillary lymph node involvement   -Infiltrating ductal carcinoma the left breast, ER/PR positive HER-2 negative.  2. Asymptomatic anemia and thrombocytopenia, chemotherapy induced, for which the platelet count has improved, stable Hgb.   Case reviewed with Dr. Pierce Crane.  Plan:  Kirsten Gibson will return on 08/31/11 for labs prior, for week 1 cycle 5/6 every three week [facility]   She and her husband know that they can feel free to contact us prior to scheduled follow up if the need should arise.  This plan  was reviewed with the patient, who voices understanding and agreement.  She knows to call with any changes or problems.    Kambryn Dapolito T, PA-C 08/26/11

## 2011-08-30 ENCOUNTER — Other Ambulatory Visit: Payer: Self-pay | Admitting: Physician Assistant

## 2011-08-30 DIAGNOSIS — C50419 Malignant neoplasm of upper-outer quadrant of unspecified female breast: Secondary | ICD-10-CM

## 2011-08-30 DIAGNOSIS — C50912 Malignant neoplasm of unspecified site of left female breast: Secondary | ICD-10-CM

## 2011-08-31 ENCOUNTER — Ambulatory Visit (HOSPITAL_BASED_OUTPATIENT_CLINIC_OR_DEPARTMENT_OTHER): Payer: Federal, State, Local not specified - PPO | Admitting: Physician Assistant

## 2011-08-31 ENCOUNTER — Other Ambulatory Visit (HOSPITAL_BASED_OUTPATIENT_CLINIC_OR_DEPARTMENT_OTHER): Payer: Federal, State, Local not specified - PPO | Admitting: Lab

## 2011-08-31 ENCOUNTER — Ambulatory Visit (HOSPITAL_BASED_OUTPATIENT_CLINIC_OR_DEPARTMENT_OTHER): Payer: Federal, State, Local not specified - PPO

## 2011-08-31 ENCOUNTER — Encounter: Payer: Self-pay | Admitting: Pharmacist

## 2011-08-31 ENCOUNTER — Telehealth: Payer: Self-pay | Admitting: *Deleted

## 2011-08-31 VITALS — BP 145/80 | HR 67 | Temp 97.7°F | Ht 60.0 in | Wt 125.9 lb

## 2011-08-31 DIAGNOSIS — C50419 Malignant neoplasm of upper-outer quadrant of unspecified female breast: Secondary | ICD-10-CM

## 2011-08-31 DIAGNOSIS — C50919 Malignant neoplasm of unspecified site of unspecified female breast: Secondary | ICD-10-CM

## 2011-08-31 DIAGNOSIS — C50912 Malignant neoplasm of unspecified site of left female breast: Secondary | ICD-10-CM

## 2011-08-31 DIAGNOSIS — D6481 Anemia due to antineoplastic chemotherapy: Secondary | ICD-10-CM

## 2011-08-31 DIAGNOSIS — Z5111 Encounter for antineoplastic chemotherapy: Secondary | ICD-10-CM

## 2011-08-31 DIAGNOSIS — C50119 Malignant neoplasm of central portion of unspecified female breast: Secondary | ICD-10-CM

## 2011-08-31 DIAGNOSIS — C773 Secondary and unspecified malignant neoplasm of axilla and upper limb lymph nodes: Secondary | ICD-10-CM

## 2011-08-31 LAB — CBC WITH DIFFERENTIAL/PLATELET
Basophils Absolute: 0 10*3/uL (ref 0.0–0.1)
Eosinophils Absolute: 0 10*3/uL (ref 0.0–0.5)
HCT: 27.1 % — ABNORMAL LOW (ref 34.8–46.6)
HGB: 8.9 g/dL — ABNORMAL LOW (ref 11.6–15.9)
MCV: 99.6 fL (ref 79.5–101.0)
MONO%: 9.4 % (ref 0.0–14.0)
NEUT#: 7 10*3/uL — ABNORMAL HIGH (ref 1.5–6.5)
NEUT%: 77.8 % — ABNORMAL HIGH (ref 38.4–76.8)
RDW: 22 % — ABNORMAL HIGH (ref 11.2–14.5)

## 2011-08-31 LAB — BASIC METABOLIC PANEL
CO2: 26 mEq/L (ref 19–32)
Calcium: 9.7 mg/dL (ref 8.4–10.5)
Creatinine, Ser: 0.8 mg/dL (ref 0.50–1.10)
Glucose, Bld: 114 mg/dL — ABNORMAL HIGH (ref 70–99)
Potassium: 3.8 mEq/L (ref 3.5–5.3)
Sodium: 137 mEq/L (ref 135–145)

## 2011-08-31 MED ORDER — DEXAMETHASONE SODIUM PHOSPHATE 4 MG/ML IJ SOLN
20.0000 mg | Freq: Once | INTRAMUSCULAR | Status: AC
  Administered 2011-08-31: 20 mg via INTRAVENOUS

## 2011-08-31 MED ORDER — SODIUM CHLORIDE 0.9 % IJ SOLN
10.0000 mL | INTRAMUSCULAR | Status: DC | PRN
  Administered 2011-08-31: 10 mL
  Filled 2011-08-31: qty 10

## 2011-08-31 MED ORDER — DOCETAXEL CHEMO INJECTION 160 MG/16ML
75.0000 mg/m2 | Freq: Once | INTRAVENOUS | Status: AC
  Administered 2011-08-31: 120 mg via INTRAVENOUS
  Filled 2011-08-31: qty 12

## 2011-08-31 MED ORDER — TRASTUZUMAB CHEMO INJECTION 440 MG
2.0000 mg/kg | Freq: Once | INTRAVENOUS | Status: AC
  Administered 2011-08-31: 105 mg via INTRAVENOUS
  Filled 2011-08-31: qty 5

## 2011-08-31 MED ORDER — SODIUM CHLORIDE 0.9 % IV SOLN
Freq: Once | INTRAVENOUS | Status: AC
  Administered 2011-08-31: 14:00:00 via INTRAVENOUS

## 2011-08-31 MED ORDER — SODIUM CHLORIDE 0.9 % IV SOLN
670.0000 mg | Freq: Once | INTRAVENOUS | Status: AC
  Administered 2011-08-31: 670 mg via INTRAVENOUS
  Filled 2011-08-31: qty 67

## 2011-08-31 MED ORDER — ACETAMINOPHEN 325 MG PO TABS
650.0000 mg | ORAL_TABLET | Freq: Once | ORAL | Status: AC
Start: 2011-08-31 — End: 2011-08-31
  Administered 2011-08-31: 650 mg via ORAL

## 2011-08-31 MED ORDER — DIPHENHYDRAMINE HCL 25 MG PO CAPS
50.0000 mg | ORAL_CAPSULE | Freq: Once | ORAL | Status: AC
  Administered 2011-08-31: 50 mg via ORAL

## 2011-08-31 MED ORDER — HEPARIN SOD (PORK) LOCK FLUSH 100 UNIT/ML IV SOLN
500.0000 [IU] | Freq: Once | INTRAVENOUS | Status: AC | PRN
  Administered 2011-08-31: 500 [IU]
  Filled 2011-08-31: qty 5

## 2011-08-31 MED ORDER — ONDANSETRON 16 MG/50ML IVPB (CHCC)
16.0000 mg | Freq: Once | INTRAVENOUS | Status: AC
  Administered 2011-08-31: 16 mg via INTRAVENOUS

## 2011-08-31 NOTE — Patient Instructions (Addendum)
[facility] Discharge Instructions for Patients Receiving Chemotherapy  Today you received the following chemotherapy agents herceptin,taxotere,carboplatin  To help prevent nausea and vomiting after your treatment, we encourage you to take your nausea medication. Begin taking it at 8pm and take it as often as prescribed for the next 48 hours if needed.   If you develop nausea and vomiting that is not controlled by your nausea medication, call the clinic. If it is after clinic hours your family physician or the after hours number for the clinic or go to the Emergency Department.   BELOW ARE SYMPTOMS THAT SHOULD BE REPORTED IMMEDIATELY:  *FEVER GREATER THAN 100.5 F  *CHILLS WITH OR WITHOUT FEVER  NAUSEA AND VOMITING THAT IS NOT CONTROLLED WITH YOUR NAUSEA MEDICATION  *UNUSUAL SHORTNESS OF BREATH  *UNUSUAL BRUISING OR BLEEDING  TENDERNESS IN MOUTH AND THROAT WITH OR WITHOUT PRESENCE OF ULCERS  *URINARY PROBLEMS  *BOWEL PROBLEMS  UNUSUAL RASH Items with * indicate a potential emergency and should be followed up as soon as possible.  One of the nurses will contact you 24 hours after your treatment. Please let the nurse know about any problems that you may have experienced. Feel free to call the clinic you have any questions or concerns. The clinic phone number is [phone].   I have been informed and understand all the instructions given to me. I know to contact the clinic, my physician, or go to the Emergency Department if any problems should occur. I do not have any questions at this time, but understand that I may call the clinic during office hours   should I have any questions or need assistance in obtaining follow up care.    __________________________________________  _____________  __________ Signature of Patient or Authorized Representative            Date                   Time    __________________________________________ Nurse's Signature

## 2011-08-31 NOTE — Progress Notes (Signed)
Hematology and Oncology Follow Up Visit  Kirsten Gibson [id number] 1945-05-11 66 y.o. 08/31/11  HPI: Kirsten Gibson is a 66 year old [place] woman with newly diagnosed bilateral breast carcinoma now being treated in the neoadjuvant setting, due for week 2 cycle 4/6 q3week TCH, with weekly Herceptin and Neulasta support on day 2.   -Infiltrating ductal carcinoma of the right breast, ER/PR/HER-2 positive with positive axillary lymph node involvement   -Infiltrating ductal carcinoma the left breast, ER/PR positive HER-2 negative.  Interim History:   Kirsten Gibson is seen today with her husband in accompaniment for brief follow up after delaying week 1 cycle 5/6 planned every 3 week doses of neoadjuvant TCH, due to thrombocytopenia and chemotherapy induced anemia.  She tolerated Aranesp without complaints. Overall, she is actually feeling pretty well denying any unexplained fevers, chills, or night sweats.  Fatigue  has improved.  No neuropathy symptoms, nailbed sensitivity, or excessive eye tearing.   A detailed review of systems is otherwise noncontributory as noted below.  Review of Systems: Constitutional: fatigued Eyes: uses glasses ENT: no complaints Cardiovascular: no chest pain or dyspnea on exertion Respiratory: no cough, shortness of breath, or wheezing Neurological: no TIA or stroke symptoms Dermatological: History of mycosis fungoides. Gastrointestinal: no abdominal pain, change in bowel habits, or black or bloody stools Genito-Urinary: no dysuria, trouble voiding, or hematuria Hematological and Lymphatic: negative Breast: known bilateral breast cancer Musculoskeletal: negative Remaining ROS negative.   Medications:   I have reviewed the patient's current medications.  Current Outpatient Prescriptions  Medication Sig Dispense Refill  . ALPRAZolam (XANAX) 0.25 MG tablet Take 1 tablet (0.25 mg total) by mouth at bedtime as needed.  60 tablet  1  . calcium  carbonate (OS-CAL) 600 MG TABS Take 600 mg by mouth 3 (three) times daily with meals.      . Cholecalciferol (VITAMIN D3 PO) Take 3,000 mg by mouth daily.       [name] dexamethasone (DECADRON) 4 MG tablet Take 2 tablets two times a day the day before Taxotere. Then take 2 tabs two times a day starting the day after chemo for 3 days.  30 tablet  1  . lidocaine-prilocaine (EMLA) cream APPLY TO PORT A CATH AS DIRECTED BEFORE CHEMO TREATMENT.  30 g  3  . ondansetron (ZOFRAN) 8 MG tablet Take 1 tablet two times a day starting the day after chemo for 3 days. Then take 1 tab two times a day as needed for nausea or vomiting.  30 tablet  1  . prochlorperazine (COMPAZINE) 10 MG tablet Take 1 tablet (10 mg total) by mouth every 6 (six) hours as needed (Nausea or vomiting).  30 tablet  1  . prochlorperazine (COMPAZINE) 25 MG suppository Place 1 suppository (25 mg total) rectally every 12 (twelve) hours as needed for nausea.  12 suppository  3  . traZODone (DESYREL) 50 MG tablet Take 1 tablet (50 mg total) by mouth at bedtime.  60 tablet  2   No current facility-administered medications for this visit.   Facility-Administered Medications Ordered in Other Visits  Medication Dose Route Frequency Provider Last Rate Last Dose  . lidocaine-prilocaine (EMLA) cream   Topical Once Pierce Crane, MD        Allergies:  Allergies  Allergen Reactions  . Latex Dermatitis and Rash  . Adhesive (Tape)     ALLERGIC TO OP-SITE......USE OP-SITE FLEXIGRID INSTEAD !!!  . Diclofenac Other (See Comments)    Fever, chills  . Naproxen Other (  See Comments)    Fever, chills    Physical Exam: Filed Vitals:   08/31/11 1204  BP: 145/80  Pulse: 67  Temp: 97.7 F (36.5 C)    Body mass index is 24.59 kg/(m^2). Weight: 125 lbs. HEENT: Sclerae anicteric, conjunctivae pink. Oropharynx clear. No mucositis or candidiasis.  Nodes: No cervical, supraclavicular, or axillary lymphadenopathy palpated.  Breast Exam: The right breast was  examined, with a palpable mass approximately 2cm in diameter. No frank evidence of palpable lymphadenopathy. The left breast was examined, there is some thickening in the mid upper-outer quadrant, but no definable mass. No palpable lymphadenopathy.  Lungs: Clear to auscultation bilaterally. No crackles, rhonchi, or wheezes.  Heart: Regular rate and rhythm.  Abdomen: Soft, nontender. Positive bowel sounds. No organomegaly or masses palpated.  Musculoskeletal: No focal spinal tenderness to palpation.  Extremities: Benign. No peripheral edema or cyanosis.  Skin: Benign, no PPE changes.  Neuro: Nonfocal, alert and oriented x 3.    Lab Results: Lab Results  Component Value Date   WBC 9.0 08/31/2011   HGB 8.9* 08/31/2011   HCT 27.1* 08/31/2011   MCV 99.6 08/31/2011   PLT 307 08/31/2011   NEUTROABS 7.0* 08/31/2011     Chemistry      Component Value Date/Time   NA 139 08/23/2011 1122   K 3.8 08/23/2011 1122   CL 104 08/23/2011 1122   CO2 27 08/23/2011 1122   BUN 10 08/23/2011 1122   CREATININE 0.65 08/23/2011 1122      Component Value Date/Time   CALCIUM 8.5 08/23/2011 1122   ALKPHOS 68 08/23/2011 1122   AST 33 08/23/2011 1122   ALT 33 08/23/2011 1122   BILITOT 0.4 08/23/2011 1122      Lab Results  Component Value Date   LABCA2 25 05/12/2011   2D Echocardiogram: 08/20/11 Study Conclusions Left ventricle: The cavity size was normal. Wall thickness was normal. Systolic function was normal. The estimated ejection fraction was in the range of 60% to 65%. Transthoracic echocardiography. M-mode, limited 2D, spectral Doppler, and color Doppler. Height: Height: 152.4cm. Height: 60in. Weight: Weight: 57.6kg. Weight: 126.7lb. Body mass index: BMI: 24.8kg/m^2. Body surface area: BSA: 1.58m^2. Blood pressure: 138/74. Patient status: Outpatient. Location: Echo laboratory. Prepared and Electronically Authenticated by Kristeen Miss    Assessment:  Kirsten Gibson is a 66 year old [place] woman with newly diagnosed bilateral breast carcinoma now being treated in the neoadjuvant setting, due for week 1 cycle 5/6 q3week TCH, with weekly Herceptin and Neulasta support on day 2.   -Infiltrating ductal carcinoma of the right breast, ER/PR/HER-2 positive with positive axillary lymph node involvement   -Infiltrating ductal carcinoma the left breast, ER/PR positive HER-2 negative.  2. Asymptomatic anemia and thrombocytopenia, chemotherapy induced, for which the platelet count has improved, stable Hgb.   Case reviewed with Dr. Pierce Crane.  Plan:  Kirsten Gibson will receive week 1 cycle 5/6 every three week [facility] today as scheduled.  She will have Neulasta on day 2, to return in 1 weeks time for labs and week 2 Herceptin.  She will be seen by Dr. Donnie Coffin prior to week 3 on 09/14/11.   She and her husband know that they can feel free to contact us prior to scheduled follow up if the need should arise.  This plan was reviewed with the patient, who voices understanding and agreement.  She knows to call with any changes or problems.    Alasha Mcguinness T, PA-C 08/31/11

## 2011-08-31 NOTE — Telephone Encounter (Signed)
Per pt request had Neulasta injection time moved from 11:00 to 2:00 on 09/01/11.  Pt made aware of time change.  No further needs or concerns voiced.

## 2011-09-01 ENCOUNTER — Ambulatory Visit (HOSPITAL_BASED_OUTPATIENT_CLINIC_OR_DEPARTMENT_OTHER): Payer: Federal, State, Local not specified - PPO

## 2011-09-01 VITALS — BP 127/68 | HR 83 | Temp 97.4°F

## 2011-09-01 DIAGNOSIS — Z5189 Encounter for other specified aftercare: Secondary | ICD-10-CM

## 2011-09-01 DIAGNOSIS — C50912 Malignant neoplasm of unspecified site of left female breast: Secondary | ICD-10-CM

## 2011-09-01 DIAGNOSIS — C50919 Malignant neoplasm of unspecified site of unspecified female breast: Secondary | ICD-10-CM

## 2011-09-01 MED ORDER — PEGFILGRASTIM INJECTION 6 MG/0.6ML
6.0000 mg | Freq: Once | SUBCUTANEOUS | Status: AC
  Administered 2011-09-01: 6 mg via SUBCUTANEOUS
  Filled 2011-09-01: qty 0.6

## 2011-09-06 ENCOUNTER — Other Ambulatory Visit: Payer: Self-pay | Admitting: Family

## 2011-09-06 DIAGNOSIS — C50919 Malignant neoplasm of unspecified site of unspecified female breast: Secondary | ICD-10-CM

## 2011-09-07 ENCOUNTER — Ambulatory Visit (HOSPITAL_BASED_OUTPATIENT_CLINIC_OR_DEPARTMENT_OTHER): Payer: Federal, State, Local not specified - PPO

## 2011-09-07 ENCOUNTER — Other Ambulatory Visit (HOSPITAL_BASED_OUTPATIENT_CLINIC_OR_DEPARTMENT_OTHER): Payer: Federal, State, Local not specified - PPO | Admitting: Lab

## 2011-09-07 VITALS — BP 101/60 | HR 98 | Temp 98.3°F | Wt 119.0 lb

## 2011-09-07 DIAGNOSIS — Z5112 Encounter for antineoplastic immunotherapy: Secondary | ICD-10-CM

## 2011-09-07 DIAGNOSIS — C773 Secondary and unspecified malignant neoplasm of axilla and upper limb lymph nodes: Secondary | ICD-10-CM

## 2011-09-07 DIAGNOSIS — C50119 Malignant neoplasm of central portion of unspecified female breast: Secondary | ICD-10-CM

## 2011-09-07 DIAGNOSIS — C50419 Malignant neoplasm of upper-outer quadrant of unspecified female breast: Secondary | ICD-10-CM

## 2011-09-07 DIAGNOSIS — C50919 Malignant neoplasm of unspecified site of unspecified female breast: Secondary | ICD-10-CM

## 2011-09-07 LAB — CBC WITH DIFFERENTIAL/PLATELET
Basophils Absolute: 0 10*3/uL (ref 0.0–0.1)
Eosinophils Absolute: 0 10*3/uL (ref 0.0–0.5)
HGB: 10.2 g/dL — ABNORMAL LOW (ref 11.6–15.9)
MCV: 98 fL (ref 79.5–101.0)
MONO#: 0.8 10*3/uL (ref 0.1–0.9)
NEUT#: 0.6 10*3/uL — ABNORMAL LOW (ref 1.5–6.5)
RBC: 3.01 10*6/uL — ABNORMAL LOW (ref 3.70–5.45)
RDW: 17.2 % — ABNORMAL HIGH (ref 11.2–14.5)
WBC: 2.4 10*3/uL — ABNORMAL LOW (ref 3.9–10.3)
lymph#: 1 10*3/uL (ref 0.9–3.3)
nRBC: 0 % (ref 0–0)

## 2011-09-07 MED ORDER — SODIUM CHLORIDE 0.9 % IJ SOLN
10.0000 mL | INTRAMUSCULAR | Status: DC | PRN
  Administered 2011-09-07: 10 mL
  Filled 2011-09-07: qty 10

## 2011-09-07 MED ORDER — HEPARIN SOD (PORK) LOCK FLUSH 100 UNIT/ML IV SOLN
500.0000 [IU] | Freq: Once | INTRAVENOUS | Status: AC | PRN
  Administered 2011-09-07: 500 [IU]
  Filled 2011-09-07: qty 5

## 2011-09-07 MED ORDER — SODIUM CHLORIDE 0.9 % IV SOLN
2.0000 mg/kg | Freq: Once | INTRAVENOUS | Status: AC
  Administered 2011-09-07: 105 mg via INTRAVENOUS
  Filled 2011-09-07: qty 5

## 2011-09-07 MED ORDER — SODIUM CHLORIDE 0.9 % IV SOLN
Freq: Once | INTRAVENOUS | Status: AC
  Administered 2011-09-07: 12:00:00 via INTRAVENOUS

## 2011-09-07 MED ORDER — ACETAMINOPHEN 325 MG PO TABS
650.0000 mg | ORAL_TABLET | Freq: Once | ORAL | Status: AC
  Administered 2011-09-07: 650 mg via ORAL

## 2011-09-07 NOTE — Patient Instructions (Signed)
[facility] Discharge Instructions for Patients Receiving Chemotherapy  Today you received the following chemotherapy agents Herceptin.  To help prevent nausea and vomiting after your treatment, we encourage you to take your nausea medication.   If you develop nausea and vomiting that is not controlled by your nausea medication, call the clinic. If it is after clinic hours your family physician or the after hours number for the clinic or go to the Emergency Department.   BELOW ARE SYMPTOMS THAT SHOULD BE REPORTED IMMEDIATELY:  *FEVER GREATER THAN 100.5 F  *CHILLS WITH OR WITHOUT FEVER  NAUSEA AND VOMITING THAT IS NOT CONTROLLED WITH YOUR NAUSEA MEDICATION  *UNUSUAL SHORTNESS OF BREATH  *UNUSUAL BRUISING OR BLEEDING  TENDERNESS IN MOUTH AND THROAT WITH OR WITHOUT PRESENCE OF ULCERS  *URINARY PROBLEMS  *BOWEL PROBLEMS  UNUSUAL RASH Items with * indicate a potential emergency and should be followed up as soon as possible.  One of the nurses will contact you 24 hours after your treatment. Please let the nurse know about any problems that you may have experienced. Feel free to call the clinic you have any questions or concerns. The clinic phone number is [phone].   I have been informed and understand all the instructions given to me. I know to contact the clinic, my physician, or go to the Emergency Department if any problems should occur. I do not have any questions at this time, but understand that I may call the clinic during office hours   should I have any questions or need assistance in obtaining follow up care.    __________________________________________  _____________  __________ Signature of Patient or Authorized Representative            Date                   Time    __________________________________________ Nurse's Signature    

## 2011-09-09 ENCOUNTER — Other Ambulatory Visit: Payer: Self-pay | Admitting: Family

## 2011-09-09 ENCOUNTER — Other Ambulatory Visit: Payer: Self-pay | Admitting: Emergency Medicine

## 2011-09-09 DIAGNOSIS — D709 Neutropenia, unspecified: Secondary | ICD-10-CM

## 2011-09-09 MED ORDER — LEVOFLOXACIN 500 MG PO TABS: 500.0000 mg | ORAL_TABLET | Freq: Every day | ORAL | Status: DC

## 2011-09-10 ENCOUNTER — Encounter (INDEPENDENT_AMBULATORY_CARE_PROVIDER_SITE_OTHER): Payer: Federal, State, Local not specified - PPO | Admitting: Surgery

## 2011-09-14 ENCOUNTER — Ambulatory Visit: Payer: Federal, State, Local not specified - PPO | Admitting: Physician Assistant

## 2011-09-14 ENCOUNTER — Ambulatory Visit (HOSPITAL_BASED_OUTPATIENT_CLINIC_OR_DEPARTMENT_OTHER): Payer: Federal, State, Local not specified - PPO | Admitting: Oncology

## 2011-09-14 ENCOUNTER — Other Ambulatory Visit (HOSPITAL_BASED_OUTPATIENT_CLINIC_OR_DEPARTMENT_OTHER): Payer: Federal, State, Local not specified - PPO | Admitting: Lab

## 2011-09-14 ENCOUNTER — Other Ambulatory Visit: Payer: Self-pay | Admitting: *Deleted

## 2011-09-14 ENCOUNTER — Ambulatory Visit (HOSPITAL_BASED_OUTPATIENT_CLINIC_OR_DEPARTMENT_OTHER): Payer: Federal, State, Local not specified - PPO

## 2011-09-14 ENCOUNTER — Other Ambulatory Visit: Payer: Federal, State, Local not specified - PPO | Admitting: Lab

## 2011-09-14 VITALS — BP 117/69 | HR 68 | Temp 97.7°F | Resp 20 | Ht 60.0 in | Wt 123.0 lb

## 2011-09-14 DIAGNOSIS — D649 Anemia, unspecified: Secondary | ICD-10-CM

## 2011-09-14 DIAGNOSIS — C50912 Malignant neoplasm of unspecified site of left female breast: Secondary | ICD-10-CM

## 2011-09-14 DIAGNOSIS — C50419 Malignant neoplasm of upper-outer quadrant of unspecified female breast: Secondary | ICD-10-CM

## 2011-09-14 DIAGNOSIS — T451X5A Adverse effect of antineoplastic and immunosuppressive drugs, initial encounter: Secondary | ICD-10-CM

## 2011-09-14 DIAGNOSIS — C50919 Malignant neoplasm of unspecified site of unspecified female breast: Secondary | ICD-10-CM

## 2011-09-14 DIAGNOSIS — C50119 Malignant neoplasm of central portion of unspecified female breast: Secondary | ICD-10-CM

## 2011-09-14 DIAGNOSIS — D6481 Anemia due to antineoplastic chemotherapy: Secondary | ICD-10-CM

## 2011-09-14 DIAGNOSIS — C773 Secondary and unspecified malignant neoplasm of axilla and upper limb lymph nodes: Secondary | ICD-10-CM

## 2011-09-14 DIAGNOSIS — K810 Acute cholecystitis: Secondary | ICD-10-CM

## 2011-09-14 DIAGNOSIS — Z5111 Encounter for antineoplastic chemotherapy: Secondary | ICD-10-CM

## 2011-09-14 LAB — CBC WITH DIFFERENTIAL/PLATELET
BASO%: 0.2 % (ref 0.0–2.0)
Eosinophils Absolute: 0 10*3/uL (ref 0.0–0.5)
HCT: 22.3 % — ABNORMAL LOW (ref 34.8–46.6)
HGB: 7.7 g/dL — ABNORMAL LOW (ref 11.6–15.9)
LYMPH%: 37.5 % (ref 14.0–49.7)
MCHC: 34.5 g/dL (ref 31.5–36.0)
MONO#: 0.4 10*3/uL (ref 0.1–0.9)
NEUT#: 2.5 10*3/uL (ref 1.5–6.5)
NEUT%: 53.5 % (ref 38.4–76.8)
Platelets: 29 10*3/uL — ABNORMAL LOW (ref 145–400)
WBC: 4.7 10*3/uL (ref 3.9–10.3)
lymph#: 1.8 10*3/uL (ref 0.9–3.3)

## 2011-09-14 MED ORDER — DARBEPOETIN ALFA-POLYSORBATE 300 MCG/0.6ML IJ SOLN
300.0000 ug | Freq: Once | INTRAMUSCULAR | Status: AC
  Administered 2011-09-14: 300 ug via SUBCUTANEOUS
  Filled 2011-09-14: qty 0.6

## 2011-09-14 MED ORDER — ALPRAZOLAM 0.25 MG PO TABS: 0.2500 mg | ORAL_TABLET | Freq: Every evening | ORAL | Status: DC | PRN

## 2011-09-14 MED ORDER — ACETAMINOPHEN 325 MG PO TABS
650.0000 mg | ORAL_TABLET | Freq: Once | ORAL | Status: AC
  Administered 2011-09-14: 650 mg via ORAL

## 2011-09-14 MED ORDER — TRASTUZUMAB CHEMO INJECTION 440 MG
2.0000 mg/kg | Freq: Once | INTRAVENOUS | Status: AC
  Administered 2011-09-14: 105 mg via INTRAVENOUS
  Filled 2011-09-14: qty 5

## 2011-09-14 MED ORDER — SODIUM CHLORIDE 0.9 % IJ SOLN
10.0000 mL | INTRAMUSCULAR | Status: DC | PRN
  Filled 2011-09-14: qty 10

## 2011-09-14 MED ORDER — DIPHENHYDRAMINE HCL 25 MG PO CAPS
50.0000 mg | ORAL_CAPSULE | Freq: Once | ORAL | Status: AC
  Administered 2011-09-14: 25 mg via ORAL

## 2011-09-14 MED ORDER — SODIUM CHLORIDE 0.9 % IV SOLN
Freq: Once | INTRAVENOUS | Status: AC
  Administered 2011-09-14: 12:00:00 via INTRAVENOUS

## 2011-09-14 MED ORDER — HEPARIN SOD (PORK) LOCK FLUSH 100 UNIT/ML IV SOLN
500.0000 [IU] | Freq: Once | INTRAVENOUS | Status: DC | PRN
  Filled 2011-09-14: qty 5

## 2011-09-14 MED ORDER — ONDANSETRON HCL 8 MG PO TABS: ORAL_TABLET | ORAL | Status: DC

## 2011-09-14 NOTE — Progress Notes (Signed)
1154 Ok to treat per Dr. Donnie Coffin with plts at 29.

## 2011-09-14 NOTE — Progress Notes (Signed)
Hematology and Oncology Follow Up Visit  SHARLYNE KOENEMAN [id number] 08/07/1945 66 y.o. 08/31/11  HPI: Mrs. Camba is a 66 year old [place] woman with newly diagnosed bilateral breast carcinoma now being treated in the neoadjuvant setting, due for week 3 cycle 5/6 q3week TCH, with weekly Herceptin and Neulasta support on day 2.   -Infiltrating ductal carcinoma of the right breast, ER/PR/HER-2 positive with positive axillary lymph node involvement   -Infiltrating ductal carcinoma the left breast, ER/PR positive HER-2 negative.  Interim History:   Mrs. Roppolo is seen today with her husband in accompaniment for brief follow up. Overall she is doing fairly well. She did have an episode of some fatigue in a week ago she claims her blood pressure was 70 systolic measured at home. She has a little bit of a tingling in the tips of her toes of the sinus meds and she doing fairly well. She is due for an injection today. We discussed scheduling of Neulasta after next week's chemotherapy. She has elected to go ahead with bilateral mastectomies at [facility]. She she is considering reconstruction. She knows that she will require node dissection and that to him will likely also need radiation therapy. A detailed review of systems is otherwise noncontributory as noted below.  Review of Systems: Constitutional: fatigued Eyes: uses glasses ENT: no complaints Cardiovascular: no chest pain or dyspnea on exertion Respiratory: no cough, shortness of breath, or wheezing Neurological: no TIA or stroke symptoms Dermatological: History of mycosis fungoides. Gastrointestinal: no abdominal pain, change in bowel habits, or black or bloody stools Genito-Urinary: no dysuria, trouble voiding, or hematuria Hematological and Lymphatic: negative Breast: known bilateral breast cancer Musculoskeletal: negative Remaining ROS negative.   Medications:   I have reviewed the patient's current  medications.  Current Outpatient Prescriptions  Medication Sig Dispense Refill  . calcium carbonate (OS-CAL) 600 MG TABS Take 600 mg by mouth 3 (three) times daily with meals.      . Cholecalciferol (VITAMIN D3 PO) Take 3,000 mg by mouth daily.       [name] dexamethasone (DECADRON) 4 MG tablet Take 2 tablets two times a day the day before Taxotere. Then take 2 tabs two times a day starting the day after chemo for 3 days.  30 tablet  1  . lidocaine-prilocaine (EMLA) cream APPLY TO PORT A CATH AS DIRECTED BEFORE CHEMO TREATMENT.  30 g  3  . prochlorperazine (COMPAZINE) 10 MG tablet Take 1 tablet (10 mg total) by mouth every 6 (six) hours as needed (Nausea or vomiting).  30 tablet  1  . prochlorperazine (COMPAZINE) 25 MG suppository Place 1 suppository (25 mg total) rectally every 12 (twelve) hours as needed for nausea.  12 suppository  3  . ALPRAZolam (XANAX) 0.25 MG tablet Take 1 tablet (0.25 mg total) by mouth at bedtime as needed.  60 tablet  1  . levofloxacin (LEVAQUIN) 500 MG tablet Take 500 mg by mouth daily.      . ondansetron (ZOFRAN) 8 MG tablet Take 1 tablet two times a day starting the day after chemo for 3 days. Then take 1 tab two times a day as needed for nausea or vomiting.  30 tablet  1  . traZODone (DESYREL) 50 MG tablet Take 1 tablet (50 mg total) by mouth at bedtime.  60 tablet  2   No current facility-administered medications for this visit.   Facility-Administered Medications Ordered in Other Visits  Medication Dose Route Frequency Provider Last Rate Last Dose  .  0.9 %  sodium chloride infusion   Intravenous Once Pierce Crane, MD      . acetaminophen (TYLENOL) tablet 650 mg  650 mg Oral Once Pierce Crane, MD   650 mg at 09/14/11 1144  . darbepoetin (ARANESP) injection 300 mcg  300 mcg Subcutaneous Once Amada Kingfisher, PA   300 mcg at 09/14/11 1345  . diphenhydrAMINE (BENADRYL) capsule 50 mg  50 mg Oral Once Pierce Crane, MD   25 mg at 09/14/11 1144  . lidocaine-prilocaine  (EMLA) cream   Topical Once Pierce Crane, MD      . trastuzumab (HERCEPTIN) 105 mg in sodium chloride 0.9 % 250 mL chemo infusion  2 mg/kg (Treatment Plan Actual) Intravenous Once Pierce Crane, MD   105 mg at 09/14/11 1256  . DISCONTD: heparin lock flush 100 unit/mL  500 Units Intracatheter Once PRN Pierce Crane, MD      . DISCONTD: sodium chloride 0.9 % injection 10 mL  10 mL Intracatheter PRN Pierce Crane, MD        Allergies:  Allergies  Allergen Reactions  . Latex Dermatitis and Rash  . Adhesive (Tape)     ALLERGIC TO OP-SITE......USE OP-SITE FLEXIGRID INSTEAD !!!  . Diclofenac Other (See Comments)    Fever, chills  . Naproxen Other (See Comments)    Fever, chills    Physical Exam: Filed Vitals:   09/14/11 1027  BP: 117/69  Pulse: 68  Temp: 97.7 F (36.5 C)  Resp: 20    Body mass index is 24.02 kg/(m^2). Weight: 125 lbs. She is not examined today   Lab Results: Lab Results  Component Value Date   WBC 4.7 09/14/2011   HGB 7.7* 09/14/2011   HCT 22.3* 09/14/2011   MCV 97.8 09/14/2011   PLT 29* 09/14/2011   NEUTROABS 2.5 09/14/2011     Chemistry      Component Value Date/Time   NA 137 08/31/2011 1138   K 3.8 08/31/2011 1138   CL 102 08/31/2011 1138   CO2 26 08/31/2011 1138   BUN 22 08/31/2011 1138   CREATININE 0.80 08/31/2011 1138      Component Value Date/Time   CALCIUM 9.7 08/31/2011 1138   ALKPHOS 68 08/23/2011 1122   AST 33 08/23/2011 1122   ALT 33 08/23/2011 1122   BILITOT 0.4 08/23/2011 1122      Lab Results  Component Value Date   LABCA2 25 05/12/2011   2D Echocardiogram: 08/20/11 Study Conclusions Left ventricle: The cavity size was normal. Wall thickness was normal. Systolic function was normal. The estimated ejection fraction was in the range of 60% to 65%. Transthoracic echocardiography. M-mode, limited 2D, spectral Doppler, and color Doppler. Height: Height: 152.4cm. Height: 60in. Weight: Weight: 57.6kg. Weight: 126.7lb. Body mass index: BMI: 24.8kg/m^2. Body  surface area: BSA: 1.44m^2. Blood pressure: 138/74. Patient status: Outpatient. Location: Echo laboratory. Prepared and Electronically Authenticated by Kristeen Miss    Assessment:  Mrs. Ehinger is a 66 year old [place] woman with newly diagnosed bilateral breast carcinoma now being treated in the neoadjuvant setting, she receive Herceptin alone today. She'll return next week for cycle 6 of TCH. She is otherwise doing well. Of note is her hemoglobin is quite well and has fallen since approximately a week ago from. Etiology this was not a obvious that she is designed claimed to be bleeding. I suspect she has fairly significant hematological toxicity from chemotherapy. She'll receive Aranesp today we'll continue monitor expectantly.     Plan:  As above This  plan was reviewed with the patient, who voices understanding and agreement.  She knows to call with any changes or problems.    Jozsef Wescoat,MD 08/31/11

## 2011-09-14 NOTE — Patient Instructions (Signed)
[facility] Discharge Instructions for Patients Receiving Chemotherapy  Today you received the following chemotherapy agent Herceptin.  To help prevent nausea and vomiting after your treatment, we encourage you to take your nausea medication. Begin taking it as often as prescribed for by Dr. Donnie Coffin.    If you develop nausea and vomiting that is not controlled by your nausea medication, call the clinic. If it is after clinic hours your family physician or the after hours number for the clinic or go to the Emergency Department.   BELOW ARE SYMPTOMS THAT SHOULD BE REPORTED IMMEDIATELY:  *FEVER GREATER THAN 100.5 F  *CHILLS WITH OR WITHOUT FEVER  NAUSEA AND VOMITING THAT IS NOT CONTROLLED WITH YOUR NAUSEA MEDICATION  *UNUSUAL SHORTNESS OF BREATH  *UNUSUAL BRUISING OR BLEEDING  TENDERNESS IN MOUTH AND THROAT WITH OR WITHOUT PRESENCE OF ULCERS  *URINARY PROBLEMS  *BOWEL PROBLEMS  UNUSUAL RASH Items with * indicate a potential emergency and should be followed up as soon as possible.  One of the nurses will contact you 24 hours after your treatment. Please let the nurse know about any problems that you may have experienced. Feel free to call the clinic you have any questions or concerns. The clinic phone number is [phone].   I have been informed and understand all the instructions given to me. I know to contact the clinic, my physician, or go to the Emergency Department if any problems should occur. I do not have any questions at this time, but understand that I may call the clinic during office hours   should I have any questions or need assistance in obtaining follow up care.    __________________________________________  _____________  __________ Signature of Patient or Authorized Representative            Date                   Time    __________________________________________ Nurse's Signature      DARBEPOETIN ALFA (dar be POE e tin AL fa) helps  your body make more red blood cells. It is used to treat anemia caused by chronic kidney failure and chemotherapy. This medicine may be used for other purposes; ask your health care provider or pharmacist if you have questions. What should I tell my health care provider before I take this medicine? They need to know if you have any of these conditions: -blood clotting disorders or history of blood clots -cancer patient not on chemotherapy -cystic fibrosis -heart disease, such as angina, heart failure, or a history of a heart attack -hemoglobin level of 12 g/dL or greater -high blood pressure -low levels of folate, iron, or vitamin B12 -seizures -an unusual or allergic reaction to darbepoetin, erythropoietin, albumin, hamster proteins, latex, other medicines, foods, dyes, or preservatives -pregnant or trying to get pregnant -breast-feeding How should I use this medicine? This medicine is for injection into a vein or under the skin. It is usually given by a health care professional in a hospital or clinic setting. If you get this medicine at home, you will be taught how to prepare and give this medicine. Do not shake the solution before you withdraw a dose. Use exactly as directed. Take your medicine at regular intervals. Do not take your medicine more often than directed. It is important that you put your used needles and syringes in a special sharps container. Do not put them in a trash can. If you do not have a sharps container,  call your pharmacist or healthcare provider to get one. Talk to your pediatrician regarding the use of this medicine in children. While this medicine may be used in children as young as 1 year for selected conditions, precautions do apply. Overdosage: If you think you have taken too much of this medicine contact a poison control center or emergency room at once. NOTE: This medicine is only for you. Do not share this medicine with others. What if I miss a dose? If you  miss a dose, take it as soon as you can. If it is almost time for your next dose, take only that dose. Do not take double or extra doses. What may interact with this medicine? Do not take this medicine with any of the following medications: -epoetin alfa This list may not describe all possible interactions. Give your health care provider a list of all the medicines, herbs, non-prescription drugs, or dietary supplements you use. Also tell them if you smoke, drink alcohol, or use illegal drugs. Some items may interact with your medicine. What should I watch for while using this medicine? Visit your prescriber or health care professional for regular checks on your progress and for the needed blood tests and blood pressure measurements. It is especially important for the doctor to make sure your hemoglobin level is in the desired range, to limit the risk of potential side effects and to give you the best benefit. Keep all appointments for any recommended tests. Check your blood pressure as directed. Ask your doctor what your blood pressure should be and when you should contact him or her. As your body makes more red blood cells, you may need to take iron, folic acid, or vitamin B supplements. Ask your doctor or health care provider which products are right for you. If you have kidney disease continue dietary restrictions, even though this medication can make you feel better. Talk with your doctor or health care professional about the foods you eat and the vitamins that you take. What side effects may I notice from receiving this medicine? Side effects that you should report to your doctor or health care professional as soon as possible: -allergic reactions like skin rash, itching or hives, swelling of the face, lips, or tongue -breathing problems -changes in vision -chest pain -confusion, trouble speaking or understanding -feeling faint or lightheaded, falls -high blood pressure -muscle aches or  pains -pain, swelling, warmth in the leg -rapid weight gain -severe headaches -sudden numbness or weakness of the face, arm or leg -trouble walking, dizziness, loss of balance or coordination -seizures (convulsions) -swelling of the ankles, feet, hands -unusually weak or tired Side effects that usually do not require medical attention (report to your doctor or health care professional if they continue or are bothersome): -diarrhea -fever, chills (flu-like symptoms) -headaches -nausea, vomiting -redness, stinging, or swelling at site where injected This list may not describe all possible side effects. Call your doctor for medical advice about side effects. You may report side effects to FDA at 1-800-FDA-1088. Where should I keep my medicine? Keep out of the reach of children. Store in a refrigerator between 2 and 8 degrees C (36 and 46 degrees F). Do not freeze. Do not shake. Throw away any unused portion if using a single-dose vial. Throw away any unused medicine after the expiration date. NOTE: This sheet is a summary. It may not cover all possible information. If you have questions about this medicine, talk to your doctor, pharmacist, or health care provider.  2012, [employer]. (01/09/2008 10:23:57 AM)

## 2011-09-15 ENCOUNTER — Ambulatory Visit: Payer: Federal, State, Local not specified - PPO

## 2011-09-21 ENCOUNTER — Ambulatory Visit (HOSPITAL_BASED_OUTPATIENT_CLINIC_OR_DEPARTMENT_OTHER): Payer: Federal, State, Local not specified - PPO | Admitting: Family

## 2011-09-21 ENCOUNTER — Other Ambulatory Visit (HOSPITAL_BASED_OUTPATIENT_CLINIC_OR_DEPARTMENT_OTHER): Payer: Federal, State, Local not specified - PPO | Admitting: Lab

## 2011-09-21 ENCOUNTER — Ambulatory Visit (HOSPITAL_BASED_OUTPATIENT_CLINIC_OR_DEPARTMENT_OTHER): Payer: Federal, State, Local not specified - PPO

## 2011-09-21 ENCOUNTER — Other Ambulatory Visit: Payer: Self-pay | Admitting: Family

## 2011-09-21 ENCOUNTER — Encounter: Payer: Self-pay | Admitting: Family

## 2011-09-21 VITALS — BP 129/75 | HR 69 | Temp 98.5°F | Resp 20 | Ht 60.0 in | Wt 123.8 lb

## 2011-09-21 DIAGNOSIS — C773 Secondary and unspecified malignant neoplasm of axilla and upper limb lymph nodes: Secondary | ICD-10-CM

## 2011-09-21 DIAGNOSIS — C50119 Malignant neoplasm of central portion of unspecified female breast: Secondary | ICD-10-CM

## 2011-09-21 DIAGNOSIS — D6481 Anemia due to antineoplastic chemotherapy: Secondary | ICD-10-CM

## 2011-09-21 DIAGNOSIS — C50419 Malignant neoplasm of upper-outer quadrant of unspecified female breast: Secondary | ICD-10-CM

## 2011-09-21 DIAGNOSIS — C50919 Malignant neoplasm of unspecified site of unspecified female breast: Secondary | ICD-10-CM

## 2011-09-21 DIAGNOSIS — Z5112 Encounter for antineoplastic immunotherapy: Secondary | ICD-10-CM

## 2011-09-21 DIAGNOSIS — T451X5A Adverse effect of antineoplastic and immunosuppressive drugs, initial encounter: Secondary | ICD-10-CM

## 2011-09-21 LAB — CBC WITH DIFFERENTIAL/PLATELET
Basophils Absolute: 0 10*3/uL (ref 0.0–0.1)
Eosinophils Absolute: 0 10*3/uL (ref 0.0–0.5)
HGB: 7.7 g/dL — ABNORMAL LOW (ref 11.6–15.9)
LYMPH%: 21.3 % (ref 14.0–49.7)
MCV: 106 fL — ABNORMAL HIGH (ref 79.5–101.0)
MONO#: 0.8 10*3/uL (ref 0.1–0.9)
NEUT#: 3.9 10*3/uL (ref 1.5–6.5)
Platelets: 51 10*3/uL — ABNORMAL LOW (ref 145–400)
RBC: 2.15 10*6/uL — ABNORMAL LOW (ref 3.70–5.45)
RDW: 18.4 % — ABNORMAL HIGH (ref 11.2–14.5)
WBC: 6 10*3/uL (ref 3.9–10.3)

## 2011-09-21 LAB — COMPREHENSIVE METABOLIC PANEL
Albumin: 3.7 g/dL (ref 3.5–5.2)
BUN: 12 mg/dL (ref 6–23)
CO2: 29 mEq/L (ref 19–32)
Glucose, Bld: 93 mg/dL (ref 70–99)
Potassium: 3.7 mEq/L (ref 3.5–5.3)
Sodium: 135 mEq/L (ref 135–145)
Total Protein: 6 g/dL (ref 6.0–8.3)

## 2011-09-21 LAB — IRON AND TIBC: UIBC: 221 ug/dL (ref 125–400)

## 2011-09-21 MED ORDER — SODIUM CHLORIDE 0.9 % IJ SOLN
10.0000 mL | INTRAMUSCULAR | Status: DC | PRN
  Administered 2011-09-21: 10 mL
  Filled 2011-09-21: qty 10

## 2011-09-21 MED ORDER — TRASTUZUMAB CHEMO INJECTION 440 MG
2.0000 mg/kg | Freq: Once | INTRAVENOUS | Status: AC
  Administered 2011-09-21: 105 mg via INTRAVENOUS
  Filled 2011-09-21: qty 5

## 2011-09-21 MED ORDER — SODIUM CHLORIDE 0.9 % IV SOLN: Freq: Once | INTRAVENOUS | Status: DC

## 2011-09-21 MED ORDER — ACETAMINOPHEN 325 MG PO TABS
650.0000 mg | ORAL_TABLET | Freq: Once | ORAL | Status: AC
  Administered 2011-09-21: 650 mg via ORAL

## 2011-09-21 MED ORDER — DIPHENHYDRAMINE HCL 25 MG PO CAPS
50.0000 mg | ORAL_CAPSULE | Freq: Once | ORAL | Status: AC
  Administered 2011-09-21: 25 mg via ORAL

## 2011-09-21 MED ORDER — HEPARIN SOD (PORK) LOCK FLUSH 100 UNIT/ML IV SOLN
500.0000 [IU] | Freq: Once | INTRAVENOUS | Status: AC | PRN
  Administered 2011-09-21: 500 [IU]
  Filled 2011-09-21: qty 5

## 2011-09-21 MED ORDER — SODIUM CHLORIDE 0.9 % IV SOLN
Freq: Once | INTRAVENOUS | Status: AC
  Administered 2011-09-21: 12:00:00 via INTRAVENOUS

## 2011-09-21 NOTE — Patient Instructions (Signed)
1. Delay chemo 1 week for anemia.  2. Appt with me in 1 week for final chemo.  3. Proceed with surgery 9/10. Will monitor her counts for recovery prior to surgery.  4. Neulasta injection 8/21.

## 2011-09-21 NOTE — Patient Instructions (Addendum)
[facility] Discharge Instructions for Patients Receiving Chemotherapy  Today you received the following chemotherapy agents Herceptin  To help prevent nausea and vomiting after your treatment, we encourage you to take your nausea medication as needed.    If you develop nausea and vomiting that is not controlled by your nausea medication, call the clinic. If it is after clinic hours call the after hours number for the clinic or go to the Emergency Department.   BELOW ARE SYMPTOMS THAT SHOULD BE REPORTED IMMEDIATELY:  *FEVER GREATER THAN 100.5 F  *CHILLS WITH OR WITHOUT FEVER  NAUSEA AND VOMITING THAT IS NOT CONTROLLED WITH YOUR NAUSEA MEDICATION  *UNUSUAL SHORTNESS OF BREATH  *UNUSUAL BRUISING OR BLEEDING  TENDERNESS IN MOUTH AND THROAT WITH OR WITHOUT PRESENCE OF ULCERS  *URINARY PROBLEMS  *BOWEL PROBLEMS  UNUSUAL RASH Items with * indicate a potential emergency and should be followed up as soon as possible.  . Feel free to call the clinic you have any questions or concerns. The clinic phone number is [phone].   I have been informed and understand all the instructions given to me. I know to contact the clinic, my physician, or go to the Emergency Department if any problems should occur. I do not have any questions at this time, but understand that I may call the clinic during office hours   should I have any questions or need assistance in obtaining follow up care.    __________________________________________  _____________  __________ Signature of Patient or Authorized Representative            Date                   Time    __________________________________________ Nurse's Signature    

## 2011-09-21 NOTE — Progress Notes (Signed)
Hematology and Oncology Follow Up Visit  Kirsten Gibson [id number] [birth year] 67 y.o. 08/31/11  HPI: Kirsten Gibson is a 66 year old [place] woman with newly diagnosed bilateral breast carcinoma now being treated in the neoadjuvant setting, due for week 3 cycle 5/6 q3week TCH, with weekly Herceptin and Neulasta support on day 2.   -Infiltrating ductal carcinoma of the right breast, ER/PR/HER-2 positive with positive axillary lymph node involvement   -Infiltrating ductal carcinoma the left breast, ER/PR positive HER-2 negative.  Interim History:   Kirsten Gibson is seen today with her husband in accompaniment for follow up. Overall she is doing fairly well, was anemic last week and reports easy fatigability. Recently saw Dr. Earney Navy at [facility] and is scheduled for surgery Sept 10. she She has decided on bilateral mastectomy. Is considering reconstruction, she knows she will require node dissection and likely also need radiation therapy. She is concerned over hypotension, present for the last two weeks. Reports slight tingling in the tips of her toes. No headache or blurred vision. No cough or shortness of breath. No abdominal pain or new bone pain. Bowel and bladder function are normal. Appetite is good, is trying to force fluids. Remainder of the 10 point  review of systems is negative.  Due for last chemotherapy today.    Medications:   I have reviewed the patient's current medications.  Allergies:  Allergies  Allergen Reactions  . Latex Dermatitis and Rash  . Adhesive (Tape)     ALLERGIC TO OP-SITE......USE OP-SITE FLEXIGRID INSTEAD !!!  . Diclofenac Other (See Comments)    Fever, chills  . Naproxen Other (See Comments)    Fever, chills    Physical Exam: Filed Vitals:   09/21/11 1039  BP: 129/75  Pulse: 69  Temp: 98.5 F (36.9 C)  Resp: 20    Body mass index is 24.18 kg/(m^2). Weight: 125 lbs. General: Well developed, well nourished, in no acute distress.    EENT: No ocular or oral lesions. No stomatitis.  Respiratory: Lungs are clear to auscultation bilaterally with normal respiratory movement and no accessory muscle use. Cardiac: No murmur, rub or tachycardia. No upper or lower extremity edema.  GI: Abdomen is soft, no palpable hepatosplenomegaly. No fluid wave. No tenderness. Musculoskeletal: No kyphosis, no tenderness over the spine, ribs or hips. Lymph: No cervical, infraclavicular, axillary or inguinal adenopathy. Neuro: No focal neurological deficits. Psych: Alert and oriented X 3, appropriate mood and affect.  BREAST EXAM: In the supine position, with the right arm over the head, the right nipple is everted. No periareolar edema or nipple discharge. In the 2 o'clock position, axillary tail, a 3 cm palpable mass. No redness of the skin. No right axillary adenopathy. With the left arm over the head, the left nipple is everted. No periareolar edema or nipple discharge. No mass in any quadrant or subareolar region. No redness of the skin. No left axillary adenopathy.    Lab Results: Lab Results  Component Value Date   WBC 6.0 09/21/2011   HGB 7.7* 09/21/2011   HCT 22.8* 09/21/2011   MCV 106.0* 09/21/2011   PLT 51* 09/21/2011   NEUTROABS 3.9 09/21/2011     Chemistry      Component Value Date/Time   NA 135 09/21/2011 1019   K 3.7 09/21/2011 1019   CL 101 09/21/2011 1019   CO2 29 09/21/2011 1019   BUN 12 09/21/2011 1019   CREATININE 0.75 09/21/2011 1019      Component Value Date/Time  CALCIUM 8.9 09/21/2011 1019   ALKPHOS 52 09/21/2011 1019   AST 23 09/21/2011 1019   ALT 20 09/21/2011 1019   BILITOT 0.3 09/21/2011 1019       Assessment:  1. Bilateral breast cancer, DCIS on the left, IDC on the right. Completing neoadjuvant chemo.  2. Anemia, related to chemo. Received Aranesp last week.  3. Scheduled for surgery Sept. 10 4. Still insecure regarding surgery and reconstruction, with many questions.   PLAN:  1. Delay chemo 1 week for  anemia.  2. Appt with me in 1 week for final chemo.  3. Proceed with surgery 9/10. Will monitor her counts for recovery prior to surgery.  4. Neulasta injection 8/21.       Plan:   Colman Cater, FNP

## 2011-09-22 ENCOUNTER — Telehealth: Payer: Self-pay | Admitting: *Deleted

## 2011-09-22 ENCOUNTER — Ambulatory Visit: Payer: Federal, State, Local not specified - PPO

## 2011-09-22 NOTE — Telephone Encounter (Signed)
Per desk RN I have rescheduled appts. Patient aware and given new date/times/.  JMW

## 2011-09-24 ENCOUNTER — Ambulatory Visit (HOSPITAL_BASED_OUTPATIENT_CLINIC_OR_DEPARTMENT_OTHER): Payer: Federal, State, Local not specified - PPO

## 2011-09-24 ENCOUNTER — Other Ambulatory Visit: Payer: Self-pay | Admitting: *Deleted

## 2011-09-24 DIAGNOSIS — I959 Hypotension, unspecified: Secondary | ICD-10-CM

## 2011-09-24 DIAGNOSIS — C50419 Malignant neoplasm of upper-outer quadrant of unspecified female breast: Secondary | ICD-10-CM

## 2011-09-24 DIAGNOSIS — C50912 Malignant neoplasm of unspecified site of left female breast: Secondary | ICD-10-CM

## 2011-09-24 MED ORDER — SODIUM CHLORIDE 0.9 % IV SOLN
Freq: Once | INTRAVENOUS | Status: AC
  Administered 2011-09-24: 16:00:00 via INTRAVENOUS

## 2011-09-24 NOTE — Patient Instructions (Addendum)
[facility] Discharge Instructions for Patients Receiving Chemotherapy  Today you received the following Normal Saline. If you develop nausea and vomiting that is not controlled by your nausea medication, call the clinic.  If it is after clinic hours your family physician or the after hours number for the clinic or go to the Emergency Department.   BELOW ARE SYMPTOMS THAT SHOULD BE REPORTED IMMEDIATELY:  *FEVER GREATER THAN 100.5 F  *CHILLS WITH OR WITHOUT FEVER  NAUSEA AND VOMITING THAT IS NOT CONTROLLED WITH YOUR NAUSEA MEDICATION  *UNUSUAL SHORTNESS OF BREATH  *UNUSUAL BRUISING OR BLEEDING  TENDERNESS IN MOUTH AND THROAT WITH OR WITHOUT PRESENCE OF ULCERS  *URINARY PROBLEMS  *BOWEL PROBLEMS  UNUSUAL RASH Items with * indicate a potential emergency and should be followed up as soon as possible.  One of the nurses will contact you 24 hours after your treatment. Please let the nurse know about any problems that you may have experienced. Feel free to call the clinic you have any questions or concerns. The clinic phone number is [phone].   I have been informed and understand all the instructions given to me. I know to contact the clinic, my physician, or go to the Emergency Department if any problems should occur. I do not have any questions at this time, but understand that I may call the clinic during office hours   should I have any questions or need assistance in obtaining follow up care.    __________________________________________  _____________  __________ Signature of Patient or Authorized Representative            Date                   Time    __________________________________________ Nurse's Signature

## 2011-09-28 ENCOUNTER — Ambulatory Visit: Payer: Federal, State, Local not specified - PPO

## 2011-09-28 ENCOUNTER — Ambulatory Visit: Payer: Federal, State, Local not specified - PPO | Admitting: Family

## 2011-09-28 ENCOUNTER — Other Ambulatory Visit: Payer: Federal, State, Local not specified - PPO | Admitting: Lab

## 2011-09-28 DIAGNOSIS — D499 Neoplasm of unspecified behavior of unspecified site: Secondary | ICD-10-CM | POA: Insufficient documentation

## 2011-09-29 ENCOUNTER — Telehealth: Payer: Self-pay | Admitting: *Deleted

## 2011-09-29 ENCOUNTER — Other Ambulatory Visit (HOSPITAL_BASED_OUTPATIENT_CLINIC_OR_DEPARTMENT_OTHER): Payer: Federal, State, Local not specified - PPO | Admitting: Lab

## 2011-09-29 ENCOUNTER — Ambulatory Visit (HOSPITAL_BASED_OUTPATIENT_CLINIC_OR_DEPARTMENT_OTHER): Payer: Federal, State, Local not specified - PPO

## 2011-09-29 ENCOUNTER — Ambulatory Visit: Payer: Federal, State, Local not specified - PPO

## 2011-09-29 ENCOUNTER — Ambulatory Visit (HOSPITAL_BASED_OUTPATIENT_CLINIC_OR_DEPARTMENT_OTHER): Payer: Federal, State, Local not specified - PPO | Admitting: Family

## 2011-09-29 ENCOUNTER — Ambulatory Visit: Payer: Federal, State, Local not specified - PPO | Admitting: Family

## 2011-09-29 ENCOUNTER — Encounter: Payer: Self-pay | Admitting: Family

## 2011-09-29 VITALS — BP 149/77 | HR 81 | Temp 98.5°F | Resp 20 | Ht 60.0 in | Wt 123.3 lb

## 2011-09-29 DIAGNOSIS — C773 Secondary and unspecified malignant neoplasm of axilla and upper limb lymph nodes: Secondary | ICD-10-CM

## 2011-09-29 DIAGNOSIS — Z5112 Encounter for antineoplastic immunotherapy: Secondary | ICD-10-CM

## 2011-09-29 DIAGNOSIS — C50419 Malignant neoplasm of upper-outer quadrant of unspecified female breast: Secondary | ICD-10-CM

## 2011-09-29 DIAGNOSIS — Z5111 Encounter for antineoplastic chemotherapy: Secondary | ICD-10-CM

## 2011-09-29 DIAGNOSIS — T451X5A Adverse effect of antineoplastic and immunosuppressive drugs, initial encounter: Secondary | ICD-10-CM

## 2011-09-29 DIAGNOSIS — C50119 Malignant neoplasm of central portion of unspecified female breast: Secondary | ICD-10-CM

## 2011-09-29 DIAGNOSIS — C50919 Malignant neoplasm of unspecified site of unspecified female breast: Secondary | ICD-10-CM

## 2011-09-29 DIAGNOSIS — D6481 Anemia due to antineoplastic chemotherapy: Secondary | ICD-10-CM

## 2011-09-29 DIAGNOSIS — C50912 Malignant neoplasm of unspecified site of left female breast: Secondary | ICD-10-CM

## 2011-09-29 LAB — COMPREHENSIVE METABOLIC PANEL
BUN: 15 mg/dL (ref 6–23)
CO2: 26 mEq/L (ref 19–32)
Calcium: 8.8 mg/dL (ref 8.4–10.5)
Chloride: 99 mEq/L (ref 96–112)
Creatinine, Ser: 0.82 mg/dL (ref 0.50–1.10)

## 2011-09-29 LAB — CBC & DIFF AND RETIC
Basophils Absolute: 0 10*3/uL (ref 0.0–0.1)
EOS%: 0 % (ref 0.0–7.0)
HGB: 8.6 g/dL — ABNORMAL LOW (ref 11.6–15.9)
Immature Retic Fract: 17.4 % — ABNORMAL HIGH (ref 1.60–10.00)
MCH: 33.5 pg (ref 25.1–34.0)
MONO#: 0.5 10*3/uL (ref 0.1–0.9)
NEUT#: 4.1 10*3/uL (ref 1.5–6.5)
RDW: 17.7 % — ABNORMAL HIGH (ref 11.2–14.5)
Retic Ct Abs: 142.64 10*3/uL — ABNORMAL HIGH (ref 33.70–90.70)
WBC: 5.4 10*3/uL (ref 3.9–10.3)
lymph#: 0.8 10*3/uL — ABNORMAL LOW (ref 0.9–3.3)

## 2011-09-29 MED ORDER — DEXAMETHASONE SODIUM PHOSPHATE 4 MG/ML IJ SOLN
20.0000 mg | Freq: Once | INTRAMUSCULAR | Status: AC
  Administered 2011-09-29: 20 mg via INTRAVENOUS

## 2011-09-29 MED ORDER — ONDANSETRON 16 MG/50ML IVPB (CHCC)
16.0000 mg | Freq: Once | INTRAVENOUS | Status: AC
  Administered 2011-09-29: 16 mg via INTRAVENOUS

## 2011-09-29 MED ORDER — SODIUM CHLORIDE 0.9 % IV SOLN
572.4000 mg | Freq: Once | INTRAVENOUS | Status: AC
  Administered 2011-09-29: 570 mg via INTRAVENOUS
  Filled 2011-09-29: qty 57

## 2011-09-29 MED ORDER — DIPHENHYDRAMINE HCL 25 MG PO CAPS
50.0000 mg | ORAL_CAPSULE | Freq: Once | ORAL | Status: AC
  Administered 2011-09-29: 50 mg via ORAL

## 2011-09-29 MED ORDER — TRASTUZUMAB CHEMO INJECTION 440 MG
2.0000 mg/kg | Freq: Once | INTRAVENOUS | Status: AC
  Administered 2011-09-29: 105 mg via INTRAVENOUS
  Filled 2011-09-29: qty 5

## 2011-09-29 MED ORDER — DOCETAXEL CHEMO INJECTION 160 MG/16ML
75.0000 mg/m2 | Freq: Once | INTRAVENOUS | Status: AC
  Administered 2011-09-29: 120 mg via INTRAVENOUS
  Filled 2011-09-29: qty 12

## 2011-09-29 MED ORDER — HEPARIN SOD (PORK) LOCK FLUSH 100 UNIT/ML IV SOLN
500.0000 [IU] | Freq: Once | INTRAVENOUS | Status: AC | PRN
  Administered 2011-09-29: 500 [IU]
  Filled 2011-09-29: qty 5

## 2011-09-29 MED ORDER — ACETAMINOPHEN 325 MG PO TABS
650.0000 mg | ORAL_TABLET | Freq: Once | ORAL | Status: AC
  Administered 2011-09-29: 650 mg via ORAL

## 2011-09-29 MED ORDER — SODIUM CHLORIDE 0.9 % IV SOLN
Freq: Once | INTRAVENOUS | Status: AC
  Administered 2011-09-29: 12:00:00 via INTRAVENOUS

## 2011-09-29 MED ORDER — SODIUM CHLORIDE 0.9 % IJ SOLN
10.0000 mL | INTRAMUSCULAR | Status: DC | PRN
  Administered 2011-09-29: 10 mL
  Filled 2011-09-29: qty 10

## 2011-09-29 NOTE — Progress Notes (Signed)
Hematology and Oncology Follow Up Visit  ADILENNE ASHWORTH [id number] 05/26/1945 66 y.o. 08/31/11  HPI: [name] is a 66 year old [place] woman with newly diagnosed bilateral breast carcinoma now being treated in the neoadjuvant setting, receiving  q3week TCH, with weekly Herceptin and Neulasta support on day 2.   -Infiltrating ductal carcinoma of the right breast, ER/PR/HER-2 positive with positive axillary lymph node involvement   -Infiltrating ductal carcinoma the left breast, ER/PR positive HER-2 negative.  Interim History:   Mrs. Lorenzi is seen today with her husband in accompaniment for follow up and final TCH. Herceptin will continue for one year. Treatment was held last week for chemo induced anemia. Received Aranesp 2 weeks ago. Overall she is doing fairly well, enjoyed the week off, although emotionally she remains fragile and anxious about upcoming surgery. Surgery scheduled for 9/10 with Dr. Earney Navy at [facility]. She has decided on bilateral mastectomy. Is considering delayed reconstruction, she knows she will require node dissection and likely also need radiation therapy.  She had been concerned over hypotension, I ordered fluids for last week and she reports improvement in blood pressure. I offer that as a possibility for next week, should she become hypotensive after this treatment today. Reports mild tingling in the tips of her toes. No headache or blurred vision. No cough or shortness of breath. No abdominal pain or new bone pain. Bowel and bladder function are normal. Appetite is good, is trying to force fluids. Remainder of the 10 point  review of systems is negative.   Medications:   I have reviewed the patient's current medications.  Allergies:  Allergies  Allergen Reactions  . Latex Dermatitis and Rash  . Adhesive (Tape)     ALLERGIC TO OP-SITE......USE OP-SITE FLEXIGRID INSTEAD !!!  . Diclofenac Other (See Comments)    Fever, chills  . Naproxen Other (See  Comments)    Fever, chills    Physical Exam: Filed Vitals:   09/29/11 0940  BP: 149/77  Pulse: 81  Temp: 98.5 F (36.9 C)  Resp: 20    Body mass index is 24.08 kg/(m^2). Weight: 125 lbs. General: Well developed, well nourished, in no acute distress.  EENT: No ocular or oral lesions. No stomatitis.  Respiratory: Lungs are clear to auscultation bilaterally with normal respiratory movement and no accessory muscle use. Cardiac: No murmur, rub or tachycardia. No upper or lower extremity edema.  GI: Abdomen is soft, no palpable hepatosplenomegaly. No fluid wave. No tenderness. Musculoskeletal: No kyphosis, no tenderness over the spine, ribs or hips. Lymph: No cervical, infraclavicular, axillary or inguinal adenopathy. Neuro: No focal neurological deficits. Psych: Alert and oriented X 3, appropriate mood and affect.   Lab Results: Lab Results  Component Value Date   WBC 5.4 09/29/2011   HGB 8.6* 09/29/2011   HCT 26.7* 09/29/2011   MCV 103.9* 09/29/2011   PLT 350 09/29/2011   NEUTROABS 4.1 09/29/2011     Chemistry      Component Value Date/Time   NA 135 09/21/2011 1019   K 3.7 09/21/2011 1019   CL 101 09/21/2011 1019   CO2 29 09/21/2011 1019   BUN 12 09/21/2011 1019   CREATININE 0.75 09/21/2011 1019      Component Value Date/Time   CALCIUM 8.9 09/21/2011 1019   ALKPHOS 52 09/21/2011 1019   AST 23 09/21/2011 1019   ALT 20 09/21/2011 1019   BILITOT 0.3 09/21/2011 1019     Assessment:  1. Bilateral breast cancer, DCIS on the left,  IDC on the right. Completing neoadjuvant chemo today.  2. Anemia, related to chemo, improved after receiving Aranesp 2 weeks ago.   3. Scheduled for surgery Sept. 10 4. Still insecure regarding surgery and reconstruction, with many questions.  5. Hypotension improved with IV fluids.   PLAN:  1. Final chemo today, neulasta tomorrow   2. Return in 1 week for lab, appt with me and Herceptin only. Return 10/12/11 for Herceptin only. Will resume Herceptin after  surgery. 3. Proceed with surgery 9/10. Will monitor her counts for recovery prior to surgery.  4. Neulasta injection 8/21.  5. Return 2 weeks after surgery to see Dr. Donnie Coffin and resume Herceptin.   Colman Cater, FNP

## 2011-09-29 NOTE — Telephone Encounter (Signed)
Sent michelle email to set up treatment for 10-05-2011 and 11-02-2011 gave patient her calendar in the chemo treatment room on 09-29-2011

## 2011-09-29 NOTE — Telephone Encounter (Signed)
Per staff message and POF I have scheduled appts. JWM  

## 2011-09-29 NOTE — Patient Instructions (Addendum)
[facility] Discharge Instructions for Patients Receiving Chemotherapy  Today you received the following chemotherapy agents taxotere, cytoxan, herceptin  To help prevent nausea and vomiting after your treatment, we encourage you to take your nausea medication  and take it as often as prescribed    If you develop nausea and vomiting that is not controlled by your nausea medication, call the clinic. If it is after clinic hours your family physician or the after hours number for the clinic or go to the Emergency Department.   BELOW ARE SYMPTOMS THAT SHOULD BE REPORTED IMMEDIATELY:  *FEVER GREATER THAN 100.5 F  *CHILLS WITH OR WITHOUT FEVER  NAUSEA AND VOMITING THAT IS NOT CONTROLLED WITH YOUR NAUSEA MEDICATION  *UNUSUAL SHORTNESS OF BREATH  *UNUSUAL BRUISING OR BLEEDING  TENDERNESS IN MOUTH AND THROAT WITH OR WITHOUT PRESENCE OF ULCERS  *URINARY PROBLEMS  *BOWEL PROBLEMS  UNUSUAL RASH Items with * indicate a potential emergency and should be followed up as soon as possible.  One of the nurses will contact you 24 hours after your treatment. Please let the nurse know about any problems that you may have experienced. Feel free to call the clinic you have any questions or concerns. The clinic phone number is [phone].   I have been informed and understand all the instructions given to me. I know to contact the clinic, my physician, or go to the Emergency Department if any problems should occur. I do not have any questions at this time, but understand that I may call the clinic during office hours   should I have any questions or need assistance in obtaining follow up care.    __________________________________________  _____________  __________ Signature of Patient or Authorized Representative            Date                   Time    __________________________________________ Nurse's Signature

## 2011-09-30 ENCOUNTER — Encounter: Payer: Self-pay | Admitting: *Deleted

## 2011-09-30 ENCOUNTER — Ambulatory Visit: Payer: Federal, State, Local not specified - PPO

## 2011-09-30 ENCOUNTER — Ambulatory Visit: Payer: Federal, State, Local not specified - PPO | Admitting: Family

## 2011-09-30 ENCOUNTER — Other Ambulatory Visit: Payer: Federal, State, Local not specified - PPO | Admitting: Lab

## 2011-09-30 ENCOUNTER — Other Ambulatory Visit: Payer: Self-pay | Admitting: *Deleted

## 2011-09-30 ENCOUNTER — Ambulatory Visit (HOSPITAL_BASED_OUTPATIENT_CLINIC_OR_DEPARTMENT_OTHER): Payer: Federal, State, Local not specified - PPO

## 2011-09-30 VITALS — BP 130/64 | HR 76 | Temp 97.5°F

## 2011-09-30 DIAGNOSIS — Z5189 Encounter for other specified aftercare: Secondary | ICD-10-CM

## 2011-09-30 DIAGNOSIS — C50119 Malignant neoplasm of central portion of unspecified female breast: Secondary | ICD-10-CM

## 2011-09-30 DIAGNOSIS — C773 Secondary and unspecified malignant neoplasm of axilla and upper limb lymph nodes: Secondary | ICD-10-CM

## 2011-09-30 DIAGNOSIS — C50419 Malignant neoplasm of upper-outer quadrant of unspecified female breast: Secondary | ICD-10-CM

## 2011-09-30 DIAGNOSIS — C50912 Malignant neoplasm of unspecified site of left female breast: Secondary | ICD-10-CM

## 2011-09-30 MED ORDER — PEGFILGRASTIM INJECTION 6 MG/0.6ML
6.0000 mg | Freq: Once | SUBCUTANEOUS | Status: AC
Start: 2011-09-30 — End: 2011-09-30
  Administered 2011-09-30: 6 mg via SUBCUTANEOUS
  Filled 2011-09-30: qty 0.6

## 2011-09-30 NOTE — Telephone Encounter (Signed)
Per pt request, a RX fir a cranial prosthesis has been faxed to [facility]

## 2011-10-01 ENCOUNTER — Ambulatory Visit: Payer: Federal, State, Local not specified - PPO

## 2011-10-04 ENCOUNTER — Other Ambulatory Visit: Payer: Self-pay | Admitting: *Deleted

## 2011-10-04 DIAGNOSIS — C50419 Malignant neoplasm of upper-outer quadrant of unspecified female breast: Secondary | ICD-10-CM

## 2011-10-05 ENCOUNTER — Ambulatory Visit (HOSPITAL_BASED_OUTPATIENT_CLINIC_OR_DEPARTMENT_OTHER): Payer: Federal, State, Local not specified - PPO | Admitting: Family

## 2011-10-05 ENCOUNTER — Ambulatory Visit: Payer: Federal, State, Local not specified - PPO | Admitting: Family

## 2011-10-05 ENCOUNTER — Ambulatory Visit (HOSPITAL_BASED_OUTPATIENT_CLINIC_OR_DEPARTMENT_OTHER): Payer: Federal, State, Local not specified - PPO

## 2011-10-05 ENCOUNTER — Other Ambulatory Visit: Payer: Federal, State, Local not specified - PPO | Admitting: Lab

## 2011-10-05 ENCOUNTER — Other Ambulatory Visit (HOSPITAL_BASED_OUTPATIENT_CLINIC_OR_DEPARTMENT_OTHER): Payer: Federal, State, Local not specified - PPO | Admitting: Lab

## 2011-10-05 ENCOUNTER — Encounter: Payer: Self-pay | Admitting: Family

## 2011-10-05 VITALS — BP 95/67 | HR 96 | Temp 98.9°F | Resp 20 | Ht 60.0 in | Wt 117.4 lb

## 2011-10-05 DIAGNOSIS — C50419 Malignant neoplasm of upper-outer quadrant of unspecified female breast: Secondary | ICD-10-CM

## 2011-10-05 DIAGNOSIS — D709 Neutropenia, unspecified: Secondary | ICD-10-CM

## 2011-10-05 DIAGNOSIS — C773 Secondary and unspecified malignant neoplasm of axilla and upper limb lymph nodes: Secondary | ICD-10-CM

## 2011-10-05 DIAGNOSIS — C50119 Malignant neoplasm of central portion of unspecified female breast: Secondary | ICD-10-CM

## 2011-10-05 DIAGNOSIS — Z5112 Encounter for antineoplastic immunotherapy: Secondary | ICD-10-CM

## 2011-10-05 DIAGNOSIS — C50919 Malignant neoplasm of unspecified site of unspecified female breast: Secondary | ICD-10-CM

## 2011-10-05 LAB — CBC WITH DIFFERENTIAL/PLATELET
BASO%: 1.3 % (ref 0.0–2.0)
EOS%: 0.8 % (ref 0.0–7.0)
MCH: 36.3 pg — ABNORMAL HIGH (ref 25.1–34.0)
MCHC: 34.2 g/dL (ref 31.5–36.0)
NEUT%: 36.4 % — ABNORMAL LOW (ref 38.4–76.8)
RBC: 2.89 10*6/uL — ABNORMAL LOW (ref 3.70–5.45)
RDW: 16.2 % — ABNORMAL HIGH (ref 11.2–14.5)
lymph#: 0.9 10*3/uL (ref 0.9–3.3)

## 2011-10-05 LAB — COMPREHENSIVE METABOLIC PANEL (CC13)
AST: 24 U/L (ref 5–34)
Calcium: 9.1 mg/dL (ref 8.4–10.4)
Chloride: 90 mEq/L — ABNORMAL LOW (ref 98–107)
Creatinine: 1 mg/dL (ref 0.6–1.1)
Total Protein: 6.4 g/dL (ref 6.4–8.3)

## 2011-10-05 MED ORDER — CIPROFLOXACIN HCL 500 MG PO TABS: 500.0000 mg | ORAL_TABLET | Freq: Two times a day (BID) | ORAL | Status: AC

## 2011-10-05 MED ORDER — SODIUM CHLORIDE 0.9 % IJ SOLN
10.0000 mL | INTRAMUSCULAR | Status: DC | PRN
  Administered 2011-10-05: 10 mL
  Filled 2011-10-05: qty 10

## 2011-10-05 MED ORDER — HEPARIN SOD (PORK) LOCK FLUSH 100 UNIT/ML IV SOLN
500.0000 [IU] | Freq: Once | INTRAVENOUS | Status: AC | PRN
  Administered 2011-10-05: 500 [IU]
  Filled 2011-10-05: qty 5

## 2011-10-05 MED ORDER — CIPROFLOXACIN HCL 500 MG PO TABS: 500.0000 mg | ORAL_TABLET | Freq: Two times a day (BID) | ORAL | Status: DC

## 2011-10-05 MED ORDER — SODIUM CHLORIDE 0.9 % IV SOLN
Freq: Once | INTRAVENOUS | Status: AC
  Administered 2011-10-05: 11:00:00 via INTRAVENOUS

## 2011-10-05 MED ORDER — ACETAMINOPHEN 325 MG PO TABS
650.0000 mg | ORAL_TABLET | Freq: Once | ORAL | Status: AC
  Administered 2011-10-05: 650 mg via ORAL

## 2011-10-05 MED ORDER — SODIUM CHLORIDE 0.9 % IV SOLN
INTRAVENOUS | Status: AC
  Administered 2011-10-05: 11:00:00 via INTRAVENOUS

## 2011-10-05 MED ORDER — TRASTUZUMAB CHEMO INJECTION 440 MG
2.0000 mg/kg | Freq: Once | INTRAVENOUS | Status: AC
  Administered 2011-10-05: 105 mg via INTRAVENOUS
  Filled 2011-10-05: qty 5

## 2011-10-05 MED ORDER — DIPHENHYDRAMINE HCL 25 MG PO CAPS
50.0000 mg | ORAL_CAPSULE | Freq: Once | ORAL | Status: AC
  Administered 2011-10-05: 25 mg via ORAL

## 2011-10-05 NOTE — Progress Notes (Signed)
Appt cancelled

## 2011-10-05 NOTE — Patient Instructions (Addendum)
Herceptin today. Fluids with Herceptin today.  Proceed with surgery 9/10. Return to see Dr. Donnie Coffin 9/24 with Herceptin treatment.

## 2011-10-05 NOTE — Progress Notes (Signed)
Hematology and Oncology Follow Up Visit  BRITANIE HARSHMAN [id number] 1946-01-18 66 y.o. 08/31/11  HPI: Kirsten Gibson is a 66 year old [place] woman with newly diagnosed bilateral breast carcinoma now being treated in the neoadjuvant setting, receiving  q3week TCH, with weekly Herceptin and Neulasta support on day 2.   -Infiltrating ductal carcinoma of the right breast, ER/PR/HER-2 positive with positive axillary lymph node involvement   -Infiltrating ductal carcinoma the left breast, ER/PR positive HER-2 negative.  Interim History:   Mrs. Barkey is seen today with her husband in accompaniment for final Herceptin before scheduled surgery 9/10. Herceptin will continue for one year. Doesn't feel well today, has no energy. Has not felt well since Sunday, which was day 3 after last chemo. Emotionally she remains fragile and anxious about upcoming surgery, cries easily. Surgery scheduled for 9/10 with Dr. Earney Navy at [facility]. She has decided on bilateral mastectomy. Is considering delayed reconstruction, she questions the need for node dissection, will likely also need radiation therapy.  She had been concerned over hypotension and tachycardia. She improves after IV fluids. Reports mild tingling in the tips of her toes. No headache or blurred vision. No cough or shortness of breath. No abdominal pain or new bone pain. Bowel and bladder function are normal. Appetite is poor-fair, is trying to force fluids. Denies fever, sweats or chills. Remainder of the 10 point  review of systems is negative.   Medications:   I have reviewed the patient's current medications.  Allergies:  Allergies  Allergen Reactions  . Latex Dermatitis and Rash  . Adhesive (Tape)     ALLERGIC TO OP-SITE......USE OP-SITE FLEXIGRID INSTEAD !!!  . Diclofenac Other (See Comments)    Fever, chills  . Naproxen Other (See Comments)    Fever, chills    Physical Exam: Filed Vitals:   10/05/11 0942  BP: 95/67  Pulse:  96  Temp: 98.9 F (37.2 C)  Resp: 20    Body mass index is 22.93 kg/(m^2). Weight: 125 lbs. General: Well developed, well nourished, in no acute distress.  EENT: No ocular or oral lesions. No stomatitis.  Respiratory: Lungs are clear to auscultation bilaterally with normal respiratory movement and no accessory muscle use. Cardiac: No murmur, rub or tachycardia. No upper or lower extremity edema.  GI: Abdomen is soft, no palpable hepatosplenomegaly. No fluid wave. No tenderness. Musculoskeletal: No kyphosis, no tenderness over the spine, ribs or hips. Lymph: No cervical, infraclavicular, axillary or inguinal adenopathy. Neuro: No focal neurological deficits. Psych: Alert and oriented X 3, appropriate mood and affect.   Lab Results: Lab Results  Component Value Date   WBC 2.4* 10/05/2011   HGB 10.5* 10/05/2011   HCT 30.7* 10/05/2011   MCV 106.2* 10/05/2011   PLT 176 10/05/2011   NEUTROABS 0.9* 10/05/2011     Chemistry      Component Value Date/Time   NA 130* 10/05/2011 0928   NA 136 09/29/2011 0929   K 3.2* 10/05/2011 0928   K 3.5 09/29/2011 0929   CL 90* 10/05/2011 0928   CL 99 09/29/2011 0929   CO2 30* 10/05/2011 0928   CO2 26 09/29/2011 0929   BUN 22.0 10/05/2011 0928   BUN 15 09/29/2011 0929   CREATININE 1.0 10/05/2011 0928   CREATININE 0.82 09/29/2011 0929      Component Value Date/Time   CALCIUM 9.1 10/05/2011 0928   CALCIUM 8.8 09/29/2011 0929   ALKPHOS 78 10/05/2011 0928   ALKPHOS 54 09/29/2011 0929   AST 24  10/05/2011 0928   AST 21 09/29/2011 0929   ALT 26 10/05/2011 0928   ALT 22 09/29/2011 0929   BILITOT 0.70 10/05/2011 0928   BILITOT 0.3 09/29/2011 0929     Assessment:  1. Bilateral breast cancer, DCIS on the left, IDC on the right. Completed neoadjuvant chemo.  2. Anemia, related to chemo, improved.  3. Scheduled for surgery Sept. 10 4. Still insecure regarding surgery and reconstruction, with many questions.  5. Hypotension improved with IV fluids.  6. Neutropenic.    PLAN:  1. Herceptin only today. 2. Fluids with Herceptin today.  3. Proceed with surgery 9/10. 4. Return to see Dr. Donnie Coffin 9/24 with Herceptin treatment 5. Prescription for Cipro 500 mg bid for 7 days.   Colman Cater, FNP

## 2011-10-05 NOTE — Patient Instructions (Addendum)
[facility] Discharge Instructions for Patients Receiving Chemotherapy  Today you received the following chemotherapy agents herceptin  To help prevent nausea and vomiting after your treatment, we encourage you to take your nausea medication as needed   If you develop nausea and vomiting that is not controlled by your nausea medication, call the clinic. If it is after clinic hours your family physician or the after hours number for the clinic or go to the Emergency Department.   BELOW ARE SYMPTOMS THAT SHOULD BE REPORTED IMMEDIATELY:  *FEVER GREATER THAN 100.5 F  *CHILLS WITH OR WITHOUT FEVER  NAUSEA AND VOMITING THAT IS NOT CONTROLLED WITH YOUR NAUSEA MEDICATION  *UNUSUAL SHORTNESS OF BREATH  *UNUSUAL BRUISING OR BLEEDING  TENDERNESS IN MOUTH AND THROAT WITH OR WITHOUT PRESENCE OF ULCERS  *URINARY PROBLEMS  *BOWEL PROBLEMS  UNUSUAL RASH Items with * indicate a potential emergency and should be followed up as soon as possible.  The clinic phone number is [phone].   I have been informed and understand all the instructions given to me. I know to contact the clinic, my physician, or go to the Emergency Department if any problems should occur. I do not have any questions at this time, but understand that I may call the clinic during office hours   should I have any questions or need assistance in obtaining follow up care.    __________________________________________  _____________  __________ Signature of Patient or Authorized Representative            Date                   Time    __________________________________________ Nurse's Signature    

## 2011-10-05 NOTE — Progress Notes (Signed)
Per Kirsten Gibson, okay to give herceptin with current ANC 0.9, WBC 2.4.  SLJ

## 2011-10-14 ENCOUNTER — Other Ambulatory Visit: Payer: Self-pay | Admitting: Emergency Medicine

## 2011-10-14 ENCOUNTER — Telehealth: Payer: Self-pay | Admitting: *Deleted

## 2011-10-14 ENCOUNTER — Telehealth: Payer: Self-pay | Admitting: Medical Oncology

## 2011-10-14 ENCOUNTER — Encounter: Payer: Self-pay | Admitting: Emergency Medicine

## 2011-10-14 ENCOUNTER — Ambulatory Visit (HOSPITAL_BASED_OUTPATIENT_CLINIC_OR_DEPARTMENT_OTHER): Payer: Federal, State, Local not specified - PPO

## 2011-10-14 ENCOUNTER — Other Ambulatory Visit: Payer: Self-pay | Admitting: *Deleted

## 2011-10-14 VITALS — BP 133/72 | HR 80 | Temp 98.7°F | Resp 20 | Ht 60.0 in | Wt 119.9 lb

## 2011-10-14 DIAGNOSIS — K810 Acute cholecystitis: Secondary | ICD-10-CM

## 2011-10-14 DIAGNOSIS — D696 Thrombocytopenia, unspecified: Secondary | ICD-10-CM | POA: Insufficient documentation

## 2011-10-14 DIAGNOSIS — C50419 Malignant neoplasm of upper-outer quadrant of unspecified female breast: Secondary | ICD-10-CM

## 2011-10-14 DIAGNOSIS — E876 Hypokalemia: Secondary | ICD-10-CM | POA: Insufficient documentation

## 2011-10-14 DIAGNOSIS — D6481 Anemia due to antineoplastic chemotherapy: Secondary | ICD-10-CM

## 2011-10-14 DIAGNOSIS — C50912 Malignant neoplasm of unspecified site of left female breast: Secondary | ICD-10-CM

## 2011-10-14 DIAGNOSIS — T451X5A Adverse effect of antineoplastic and immunosuppressive drugs, initial encounter: Secondary | ICD-10-CM

## 2011-10-14 DIAGNOSIS — C50919 Malignant neoplasm of unspecified site of unspecified female breast: Secondary | ICD-10-CM

## 2011-10-14 MED ORDER — SODIUM CHLORIDE 0.9 % IJ SOLN
10.0000 mL | INTRAMUSCULAR | Status: DC | PRN
  Filled 2011-10-14: qty 10

## 2011-10-14 MED ORDER — DARBEPOETIN ALFA-POLYSORBATE 300 MCG/0.6ML IJ SOLN
300.0000 ug | Freq: Once | INTRAMUSCULAR | Status: AC
  Administered 2011-10-14: 300 ug via SUBCUTANEOUS
  Filled 2011-10-14: qty 0.6

## 2011-10-14 MED ORDER — HEPARIN SOD (PORK) LOCK FLUSH 100 UNIT/ML IV SOLN
500.0000 [IU] | Freq: Once | INTRAVENOUS | Status: DC
  Filled 2011-10-14: qty 5

## 2011-10-14 MED ORDER — POTASSIUM CHLORIDE CRYS ER 20 MEQ PO TBCR: EXTENDED_RELEASE_TABLET | ORAL | Status: DC

## 2011-10-14 NOTE — Telephone Encounter (Signed)
Pt called and is upset because she has not heard from anyone in our office. She asked if I could find out about her receiving potassium. Pt states that Emelia Salisbury  [facility] called this am regarding her HGB and her potassium. I explained to her that this message has been given to Dr. Renelda Loma NP Colman Cater. She was upset because she needed this addressed ASAP. I explained that I will speak with Sharyl Nimrod- Dr. Renelda Loma nurse regarding this. I called Sharyl Nimrod to update her and she will follow up.

## 2011-10-14 NOTE — Progress Notes (Signed)
Patient port flushed by desk nurse

## 2011-10-14 NOTE — Telephone Encounter (Signed)
Received call from Marzella Schlein PA/[facility] stating pt is scheduled for surgery on the 10th (Sept) & they are screening for anesthesia & labs done show K+ 3.0 yest down from 8/27 3/2 & don't see any notation of K+ replacement.  Also plt down from 8/27 175k to 49 k & hgb down from 10.5 to 8.7.  She would like to know if we would like to give her some erythropoiten & K+ replacement & does surgery need to be postponed.  She can be reached at [phone]. This message will be given to Dr. Renelda Loma nurse & hopefully f/u with Dr Darnelle Catalan.

## 2011-10-15 ENCOUNTER — Encounter: Payer: Self-pay | Admitting: Emergency Medicine

## 2011-10-15 NOTE — Progress Notes (Signed)
Called patient and notified of her lab and infusion appointment on 9/9 at 0900 and 1000.

## 2011-10-18 ENCOUNTER — Other Ambulatory Visit (HOSPITAL_BASED_OUTPATIENT_CLINIC_OR_DEPARTMENT_OTHER): Payer: Federal, State, Local not specified - PPO | Admitting: Lab

## 2011-10-18 DIAGNOSIS — C50119 Malignant neoplasm of central portion of unspecified female breast: Secondary | ICD-10-CM

## 2011-10-18 DIAGNOSIS — D709 Neutropenia, unspecified: Secondary | ICD-10-CM

## 2011-10-18 DIAGNOSIS — C50912 Malignant neoplasm of unspecified site of left female breast: Secondary | ICD-10-CM

## 2011-10-18 LAB — CBC & DIFF AND RETIC
BASO%: 0.4 % (ref 0.0–2.0)
HCT: 22.5 % — ABNORMAL LOW (ref 34.8–46.6)
Immature Retic Fract: 37.1 % — ABNORMAL HIGH (ref 1.60–10.00)
MCHC: 33.8 g/dL (ref 31.5–36.0)
MONO#: 0.6 10*3/uL (ref 0.1–0.9)
NEUT%: 54.8 % (ref 38.4–76.8)
RDW: 14.6 % — ABNORMAL HIGH (ref 11.2–14.5)
Retic %: 2.06 % (ref 0.70–2.10)
Retic Ct Abs: 45.32 10*3/uL (ref 33.70–90.70)
WBC: 4.8 10*3/uL (ref 3.9–10.3)
lymph#: 1.6 10*3/uL (ref 0.9–3.3)
nRBC: 0 % (ref 0–0)

## 2011-10-18 LAB — BASIC METABOLIC PANEL (CC13)
Calcium: 9.2 mg/dL (ref 8.4–10.4)
Glucose: 83 mg/dl (ref 70–99)
Sodium: 139 mEq/L (ref 136–145)

## 2011-10-19 ENCOUNTER — Encounter (HOSPITAL_COMMUNITY)
Admission: RE | Admit: 2011-10-19 | Discharge: 2011-10-19 | Disposition: A | Discharge status: Home / Self Care | Payer: Federal, State, Local not specified - PPO | Source: Ambulatory Visit | Attending: Oncology | Admitting: Oncology

## 2011-10-20 ENCOUNTER — Other Ambulatory Visit: Payer: Self-pay | Admitting: *Deleted

## 2011-10-20 DIAGNOSIS — C50419 Malignant neoplasm of upper-outer quadrant of unspecified female breast: Secondary | ICD-10-CM

## 2011-10-25 ENCOUNTER — Other Ambulatory Visit (HOSPITAL_BASED_OUTPATIENT_CLINIC_OR_DEPARTMENT_OTHER): Payer: Federal, State, Local not specified - PPO | Admitting: Lab

## 2011-10-25 DIAGNOSIS — C50919 Malignant neoplasm of unspecified site of unspecified female breast: Secondary | ICD-10-CM

## 2011-10-25 LAB — CBC WITH DIFFERENTIAL/PLATELET
Basophils Absolute: 0.1 10*3/uL (ref 0.0–0.1)
EOS%: 0.8 % (ref 0.0–7.0)
Eosinophils Absolute: 0 10*3/uL (ref 0.0–0.5)
HCT: 26.1 % — ABNORMAL LOW (ref 34.8–46.6)
HGB: 8.3 g/dL — ABNORMAL LOW (ref 11.6–15.9)
MCH: 33.9 pg (ref 25.1–34.0)
MCV: 106.5 fL — ABNORMAL HIGH (ref 79.5–101.0)
NEUT#: 3 10*3/uL (ref 1.5–6.5)
NEUT%: 56.6 % (ref 38.4–76.8)
RDW: 16.7 % — ABNORMAL HIGH (ref 11.2–14.5)
lymph#: 1.5 10*3/uL (ref 0.9–3.3)

## 2011-10-27 ENCOUNTER — Other Ambulatory Visit: Payer: Self-pay | Admitting: Emergency Medicine

## 2011-10-27 DIAGNOSIS — C50919 Malignant neoplasm of unspecified site of unspecified female breast: Secondary | ICD-10-CM

## 2011-10-27 MED ORDER — ALPRAZOLAM 0.25 MG PO TABS: 0.2500 mg | ORAL_TABLET | Freq: Every evening | ORAL | Status: DC | PRN

## 2011-11-02 ENCOUNTER — Other Ambulatory Visit: Payer: Self-pay | Admitting: Emergency Medicine

## 2011-11-02 ENCOUNTER — Ambulatory Visit (HOSPITAL_BASED_OUTPATIENT_CLINIC_OR_DEPARTMENT_OTHER): Payer: Federal, State, Local not specified - PPO | Admitting: Oncology

## 2011-11-02 ENCOUNTER — Other Ambulatory Visit (HOSPITAL_BASED_OUTPATIENT_CLINIC_OR_DEPARTMENT_OTHER): Payer: Federal, State, Local not specified - PPO | Admitting: Lab

## 2011-11-02 ENCOUNTER — Telehealth: Payer: Self-pay | Admitting: *Deleted

## 2011-11-02 ENCOUNTER — Ambulatory Visit (HOSPITAL_BASED_OUTPATIENT_CLINIC_OR_DEPARTMENT_OTHER): Payer: Federal, State, Local not specified - PPO

## 2011-11-02 VITALS — BP 138/80 | HR 73 | Temp 98.4°F | Resp 20 | Ht 60.0 in | Wt 118.3 lb

## 2011-11-02 DIAGNOSIS — C50912 Malignant neoplasm of unspecified site of left female breast: Secondary | ICD-10-CM

## 2011-11-02 DIAGNOSIS — Z5112 Encounter for antineoplastic immunotherapy: Secondary | ICD-10-CM

## 2011-11-02 DIAGNOSIS — Z170 Estrogen receptor positive status [ER+]: Secondary | ICD-10-CM

## 2011-11-02 DIAGNOSIS — C50419 Malignant neoplasm of upper-outer quadrant of unspecified female breast: Secondary | ICD-10-CM

## 2011-11-02 DIAGNOSIS — C50119 Malignant neoplasm of central portion of unspecified female breast: Secondary | ICD-10-CM

## 2011-11-02 LAB — CBC WITH DIFFERENTIAL/PLATELET
Basophils Absolute: 0.1 10*3/uL (ref 0.0–0.1)
EOS%: 1.1 % (ref 0.0–7.0)
Eosinophils Absolute: 0.1 10*3/uL (ref 0.0–0.5)
HGB: 10.2 g/dL — ABNORMAL LOW (ref 11.6–15.9)
LYMPH%: 25.7 % (ref 14.0–49.7)
MCH: 35.5 pg — ABNORMAL HIGH (ref 25.1–34.0)
MCV: 106.3 fL — ABNORMAL HIGH (ref 79.5–101.0)
MONO%: 13.3 % (ref 0.0–14.0)
NEUT#: 3.1 10*3/uL (ref 1.5–6.5)
NEUT%: 57.7 % (ref 38.4–76.8)
Platelets: 266 10*3/uL (ref 145–400)

## 2011-11-02 LAB — COMPREHENSIVE METABOLIC PANEL (CC13)
AST: 22 U/L (ref 5–34)
Albumin: 3.7 g/dL (ref 3.5–5.0)
Alkaline Phosphatase: 57 U/L (ref 40–150)
BUN: 16 mg/dL (ref 7.0–26.0)
Creatinine: 0.8 mg/dL (ref 0.6–1.1)
Glucose: 71 mg/dl (ref 70–99)
Potassium: 4.3 mEq/L (ref 3.5–5.1)
Total Bilirubin: 0.3 mg/dL (ref 0.20–1.20)

## 2011-11-02 MED ORDER — TRASTUZUMAB CHEMO INJECTION 440 MG
6.0000 mg/kg | Freq: Once | INTRAVENOUS | Status: AC
  Administered 2011-11-02: 315 mg via INTRAVENOUS
  Filled 2011-11-02: qty 15

## 2011-11-02 MED ORDER — ACETAMINOPHEN 325 MG PO TABS
650.0000 mg | ORAL_TABLET | Freq: Once | ORAL | Status: AC
  Administered 2011-11-02: 650 mg via ORAL

## 2011-11-02 MED ORDER — SODIUM CHLORIDE 0.9 % IJ SOLN
10.0000 mL | INTRAMUSCULAR | Status: DC | PRN
  Administered 2011-11-02: 10 mL
  Filled 2011-11-02: qty 10

## 2011-11-02 MED ORDER — HEPARIN SOD (PORK) LOCK FLUSH 100 UNIT/ML IV SOLN
500.0000 [IU] | Freq: Once | INTRAVENOUS | Status: AC | PRN
  Administered 2011-11-02: 500 [IU]
  Filled 2011-11-02: qty 5

## 2011-11-02 MED ORDER — SODIUM CHLORIDE 0.9 % IV SOLN
Freq: Once | INTRAVENOUS | Status: AC
  Administered 2011-11-02: 12:00:00 via INTRAVENOUS

## 2011-11-02 MED ORDER — DIPHENHYDRAMINE HCL 25 MG PO CAPS
50.0000 mg | ORAL_CAPSULE | Freq: Once | ORAL | Status: AC
  Administered 2011-11-02: 25 mg via ORAL

## 2011-11-02 MED ORDER — GABAPENTIN 300 MG PO CAPS: ORAL_CAPSULE | ORAL | Status: DC

## 2011-11-02 NOTE — Telephone Encounter (Signed)
Made patient appointment for 12-08-2011 starting at 3:30pm

## 2011-11-02 NOTE — Patient Instructions (Addendum)
[facility] Discharge Instructions for Patients Receiving Chemotherapy  Today you received the following chemotherapy agent Herceptin.  To help prevent nausea and vomiting after your treatment, we encourage you to take your nausea medication. Begin taking your nausea medication as often as prescribed for by Dr. Rubin.    If you develop nausea and vomiting that is not controlled by your nausea medication, call the clinic. If it is after clinic hours your family physician or the after hours number for the clinic or go to the Emergency Department.   BELOW ARE SYMPTOMS THAT SHOULD BE REPORTED IMMEDIATELY:  *FEVER GREATER THAN 100.5 F  *CHILLS WITH OR WITHOUT FEVER  NAUSEA AND VOMITING THAT IS NOT CONTROLLED WITH YOUR NAUSEA MEDICATION  *UNUSUAL SHORTNESS OF BREATH  *UNUSUAL BRUISING OR BLEEDING  TENDERNESS IN MOUTH AND THROAT WITH OR WITHOUT PRESENCE OF ULCERS  *URINARY PROBLEMS  *BOWEL PROBLEMS  UNUSUAL RASH Items with * indicate a potential emergency and should be followed up as soon as possible.  One of the nurses will contact you 24 hours after your treatment. Please let the nurse know about any problems that you may have experienced. Feel free to call the clinic you have any questions or concerns. The clinic phone number is [phone].   I have been informed and understand all the instructions given to me. I know to contact the clinic, my physician, or go to the Emergency Department if any problems should occur. I do not have any questions at this time, but understand that I may call the clinic during office hours   should I have any questions or need assistance in obtaining follow up care.    __________________________________________  _____________  __________ Signature of Patient or Authorized Representative            Date                   Time    __________________________________________ Nurse's Signature    

## 2011-11-03 ENCOUNTER — Encounter: Payer: Self-pay | Admitting: *Deleted

## 2011-11-03 NOTE — Progress Notes (Unsigned)
Per pt request have faxed all labs dated 11/02/11 to Jaclynn Guarneri [phone]

## 2011-11-07 NOTE — Progress Notes (Signed)
Hematology and Oncology Follow Up Visit  Kirsten Gibson [id number] 01-Oct-1945 66 y.o. 08/31/11  HPI: Kirsten Gibson is a 66 year old [place] woman with newly diagnosed bilateral breast carcinoma now being treated in the neoadjuvant setting, receiving  q3week TCH, with weekly Herceptin and Neulasta support on day 2.   -Infiltrating ductal carcinoma of the right breast, ER/PR/HER-2 positive with positive axillary lymph node involvement   -Infiltrating ductal carcinoma the left breast, ER/PR positive HER-2 negative.  Interim History:   Patient returns for followup. She completed her 6 cycles of TCH and now will be receiving Herceptin alone. She is due for surgery a few weeks ago or so ago but her counts were low for the get canceled this. She is now due to have this in her in 2 weeks' time. She feels much better. She is receiving Aranesp. She's not had any bleeding or bruising. She does have a lot of concerns about her overall status.   Remainder of the 10 point  review of systems is negative.   Medications:   I have reviewed the patient's current medications.  Allergies:  Allergies  Allergen Reactions  . Latex Dermatitis and Rash  . Adhesive (Tape)     ALLERGIC TO OP-SITE......USE OP-SITE FLEXIGRID INSTEAD !!!  . Diclofenac Other (See Comments)    Fever, chills  . Naproxen Other (See Comments)    Fever, chills    Physical Exam: Filed Vitals:   11/02/11 1015  BP: 138/80  Pulse: 73  Temp: 98.4 F (36.9 C)  Resp: 20    Body mass index is 23.10 kg/(m^2). Weight: 125 lbs. General: Well developed, well nourished, in no acute distress.  EENT: No ocular or oral lesions. No stomatitis.  Respiratory: Lungs are clear to auscultation bilaterally with normal respiratory movement and no accessory muscle use. Cardiac: No murmur, rub or tachycardia. No upper or lower extremity edema.  GI: Abdomen is soft, no palpable hepatosplenomegaly. No fluid wave. No  tenderness. Musculoskeletal: No kyphosis, no tenderness over the spine, ribs or hips. Lymph: No cervical, infraclavicular, axillary or inguinal adenopathy. Neuro: No focal neurological deficits. Psych: Alert and oriented X 3, appropriate mood and affect.  Right breast has a palpable area in the upper-outer quadrant which I suspect may be a fibroadenoma it has not changed he had a mass which was adjacent area is much softer there is hard her to feel. Left breast is normal. Lab Results: Lab Results  Component Value Date   WBC 5.3 11/02/2011   HGB 10.2* 11/02/2011   HCT 30.6* 11/02/2011   MCV 106.3* 11/02/2011   PLT 266 11/02/2011   NEUTROABS 3.1 11/02/2011     Chemistry      Component Value Date/Time   NA 141 11/02/2011 0950   NA 136 09/29/2011 0929   K 4.3 11/02/2011 0950   K 3.5 09/29/2011 0929   CL 102 11/02/2011 0950   CL 99 09/29/2011 0929   CO2 27 11/02/2011 0950   CO2 26 09/29/2011 0929   BUN 16.0 11/02/2011 0950   BUN 15 09/29/2011 0929   CREATININE 0.8 11/02/2011 0950   CREATININE 0.82 09/29/2011 0929      Component Value Date/Time   CALCIUM 10.2 11/02/2011 0950   CALCIUM 8.8 09/29/2011 0929   ALKPHOS 57 11/02/2011 0950   ALKPHOS 54 09/29/2011 0929   AST 22 11/02/2011 0950   AST 21 09/29/2011 0929   ALT 17 11/02/2011 0950   ALT 22 09/29/2011 0929   BILITOT 0.30 11/02/2011  0950   BILITOT 0.3 09/29/2011 0929     Assessment:  Counts have improved. She will have her surgery next week or so. She'll have bilateral mastectomies look forward to seeing results. I believe she is having implants placed.  PLAN:  Followup 2 weeks after surgery   Jizel Cheeks, md

## 2011-11-08 ENCOUNTER — Other Ambulatory Visit: Payer: Federal, State, Local not specified - PPO | Admitting: Lab

## 2011-11-08 DIAGNOSIS — C50419 Malignant neoplasm of upper-outer quadrant of unspecified female breast: Secondary | ICD-10-CM

## 2011-11-08 DIAGNOSIS — C50919 Malignant neoplasm of unspecified site of unspecified female breast: Secondary | ICD-10-CM

## 2011-11-08 LAB — CBC WITH DIFFERENTIAL/PLATELET
Basophils Absolute: 0.1 10*3/uL (ref 0.0–0.1)
Eosinophils Absolute: 0.1 10*3/uL (ref 0.0–0.5)
HGB: 10.2 g/dL — ABNORMAL LOW (ref 11.6–15.9)
LYMPH%: 25.1 % (ref 14.0–49.7)
MCH: 35.1 pg — ABNORMAL HIGH (ref 25.1–34.0)
MCV: 105.3 fL — ABNORMAL HIGH (ref 79.5–101.0)
MONO%: 10.2 % (ref 0.0–14.0)
NEUT#: 2.9 10*3/uL (ref 1.5–6.5)
Platelets: 213 10*3/uL (ref 145–400)

## 2011-11-08 LAB — COMPREHENSIVE METABOLIC PANEL (CC13)
Alkaline Phosphatase: 63 U/L (ref 40–150)
BUN: 20 mg/dL (ref 7.0–26.0)
Creatinine: 0.8 mg/dL (ref 0.6–1.1)
Glucose: 94 mg/dl (ref 70–99)
Total Bilirubin: 0.3 mg/dL (ref 0.20–1.20)

## 2011-11-09 HISTORY — PX: MASTECTOMY: SHX3

## 2011-12-08 ENCOUNTER — Ambulatory Visit (HOSPITAL_BASED_OUTPATIENT_CLINIC_OR_DEPARTMENT_OTHER): Payer: Federal, State, Local not specified - PPO | Admitting: Oncology

## 2011-12-08 ENCOUNTER — Other Ambulatory Visit (HOSPITAL_BASED_OUTPATIENT_CLINIC_OR_DEPARTMENT_OTHER): Payer: Federal, State, Local not specified - PPO | Admitting: Lab

## 2011-12-08 VITALS — BP 120/75 | HR 72 | Temp 98.5°F | Resp 20 | Ht 60.0 in | Wt 112.6 lb

## 2011-12-08 DIAGNOSIS — C50419 Malignant neoplasm of upper-outer quadrant of unspecified female breast: Secondary | ICD-10-CM

## 2011-12-08 DIAGNOSIS — C50919 Malignant neoplasm of unspecified site of unspecified female breast: Secondary | ICD-10-CM

## 2011-12-08 DIAGNOSIS — C50912 Malignant neoplasm of unspecified site of left female breast: Secondary | ICD-10-CM

## 2011-12-08 DIAGNOSIS — C50119 Malignant neoplasm of central portion of unspecified female breast: Secondary | ICD-10-CM

## 2011-12-08 DIAGNOSIS — C773 Secondary and unspecified malignant neoplasm of axilla and upper limb lymph nodes: Secondary | ICD-10-CM

## 2011-12-08 LAB — COMPREHENSIVE METABOLIC PANEL (CC13)
Albumin: 3.2 g/dL — ABNORMAL LOW (ref 3.5–5.0)
Alkaline Phosphatase: 87 U/L (ref 40–150)
BUN: 19 mg/dL (ref 7.0–26.0)
Glucose: 111 mg/dl — ABNORMAL HIGH (ref 70–99)
Potassium: 3.7 mEq/L (ref 3.5–5.1)

## 2011-12-08 LAB — CBC WITH DIFFERENTIAL/PLATELET
Basophils Absolute: 0.1 10*3/uL (ref 0.0–0.1)
Eosinophils Absolute: 0.1 10*3/uL (ref 0.0–0.5)
HCT: 24.9 % — ABNORMAL LOW (ref 34.8–46.6)
HGB: 8.3 g/dL — ABNORMAL LOW (ref 11.6–15.9)
LYMPH%: 32.3 % (ref 14.0–49.7)
MCV: 97.3 fL (ref 79.5–101.0)
MONO%: 6.8 % (ref 0.0–14.0)
NEUT#: 4 10*3/uL (ref 1.5–6.5)
Platelets: 253 10*3/uL (ref 145–400)
RDW: 14.8 % — ABNORMAL HIGH (ref 11.2–14.5)

## 2011-12-08 MED ORDER — ALPRAZOLAM 0.25 MG PO TABS: 0.2500 mg | ORAL_TABLET | Freq: Every evening | ORAL | Status: DC | PRN

## 2011-12-08 MED ORDER — POLYSACCHARIDE IRON COMPLEX 150 MG PO CAPS: 150.0000 mg | ORAL_CAPSULE | Freq: Two times a day (BID) | ORAL | Status: DC

## 2011-12-08 NOTE — Progress Notes (Signed)
Hematology and Oncology Follow Up Visit  CHANELE DOUGLAS [id number] 04-26-1945 66 y.o. 08/31/11  HPI: Mrs. Esselman is a 66 year old [place] woman with newly diagnosed bilateral breast carcinoma now being treated in the neoadjuvant setting, receiving  q3week TCH, with weekly Herceptin and Neulasta support on day 2.   -Infiltrating ductal carcinoma of the right breast, ER/PR/HER-2 positive with positive axillary lymph node involvement   -Infiltrating ductal carcinoma the left breast, ER/PR positive HER-2 negative.  Interim History:   Patient returns for followup. She had her surgery at [facility] about a month ago she did fairly well. She is here for followup. Pathology was reviewed. She didn't had a total of 3 involved lymph nodes in the right side with one lymph node showing microscopic involvement. There is a residual 1.2 cm tumor in the right. The left side had a wrist small residual tumor as well. She has some fatigue but otherwise is doing well. She has some anxiety skin concerns about the amount of lymph node involvement.   Remainder of the 10 point  review of systems is negative.   Medications:   I have reviewed the patient's current medications.  Allergies:  Allergies  Allergen Reactions  . Latex Dermatitis and Rash  . Adhesive (Tape)     ALLERGIC TO OP-SITE......USE OP-SITE FLEXIGRID INSTEAD !!!  . Diclofenac Other (See Comments)    Fever, chills  . Naproxen Other (See Comments)    Fever, chills    Physical Exam: Filed Vitals:   12/08/11 1533  BP: 120/75  Pulse: 72  Temp: 98.5 F (36.9 C)  Resp: 20    Body mass index is 21.99 kg/(m^2). Weight: 125 lbs. General: Well developed, well nourished, in no acute distress.  EENT: No ocular or oral lesions. No stomatitis.  Respiratory: Lungs are clear to auscultation bilaterally with normal respiratory movement and no accessory muscle use. Cardiac: No murmur, rub or tachycardia. No upper or lower extremity  edema.  GI: Abdomen is soft, no palpable hepatosplenomegaly. No fluid wave. No tenderness. Musculoskeletal: No kyphosis, no tenderness over the spine, ribs or hips. Lymph: No cervical, infraclavicular, axillary or inguinal adenopathy. Neuro: No focal neurological deficits. Psych: Alert and oriented X 3, appropriate mood and affect.   Lab Results: Lab Results  Component Value Date   WBC 6.8 12/08/2011   HGB 8.3* 12/08/2011   HCT 24.9* 12/08/2011   MCV 97.3 12/08/2011   PLT 253 12/08/2011   NEUTROABS 4.0 12/08/2011     Chemistry      Component Value Date/Time   NA 140 12/08/2011 1521   NA 136 09/29/2011 0929   K 3.7 12/08/2011 1521   K 3.5 09/29/2011 0929   CL 103 12/08/2011 1521   CL 99 09/29/2011 0929   CO2 31* 12/08/2011 1521   CO2 26 09/29/2011 0929   BUN 19.0 12/08/2011 1521   BUN 15 09/29/2011 0929   CREATININE 0.8 12/08/2011 1521   CREATININE 0.82 09/29/2011 0929      Component Value Date/Time   CALCIUM 9.5 12/08/2011 1521   CALCIUM 8.8 09/29/2011 0929   ALKPHOS 87 12/08/2011 1521   ALKPHOS 54 09/29/2011 0929   AST 16 12/08/2011 1521   AST 21 09/29/2011 0929   ALT 11 12/08/2011 1521   ALT 22 09/29/2011 0929   BILITOT <0.20 Repeated and Verified 12/08/2011 1521   BILITOT 0.3 09/29/2011 0929     Assessment:  Patient is doing fairly well. We had a fairly long discussion about followup  treatment plans. She will continue to receive Herceptin every 3 weeks. She is due for another dose next week. She will followup echo as well. We will make a referral to radiation oncology. She will continue the Herceptin is noted and begin AI therapy after completion of radiation.  PLAN:  We will continue to follow her every 3 weeks with Herceptin treatment.  Gerhard Rappaport, md

## 2011-12-09 ENCOUNTER — Other Ambulatory Visit: Payer: Self-pay | Admitting: *Deleted

## 2011-12-09 ENCOUNTER — Telehealth: Payer: Self-pay | Admitting: *Deleted

## 2011-12-09 DIAGNOSIS — C50919 Malignant neoplasm of unspecified site of unspecified female breast: Secondary | ICD-10-CM

## 2011-12-09 MED ORDER — ALPRAZOLAM 0.25 MG PO TABS: ORAL_TABLET | ORAL | Status: DC

## 2011-12-09 NOTE — Telephone Encounter (Signed)
Per staff message and POF I have scheduled appts.  JMW  

## 2011-12-09 NOTE — Telephone Encounter (Signed)
Gave patient appointment for echo on 12-14-2011 at 9:00am   Gave patient appointment for [facility] appointment on 12-16-2011  Patient aware of all the appointments

## 2011-12-10 ENCOUNTER — Other Ambulatory Visit: Payer: Self-pay | Admitting: Emergency Medicine

## 2011-12-10 DIAGNOSIS — C50919 Malignant neoplasm of unspecified site of unspecified female breast: Secondary | ICD-10-CM

## 2011-12-10 MED ORDER — ALPRAZOLAM 0.25 MG PO TABS: 0.5000 mg | ORAL_TABLET | Freq: Every evening | ORAL | Status: DC | PRN

## 2011-12-14 ENCOUNTER — Other Ambulatory Visit: Payer: Self-pay | Admitting: *Deleted

## 2011-12-14 ENCOUNTER — Ambulatory Visit (HOSPITAL_COMMUNITY): Payer: Federal, State, Local not specified - PPO

## 2011-12-15 ENCOUNTER — Ambulatory Visit: Payer: Federal, State, Local not specified - PPO | Attending: Surgical Oncology | Admitting: Physical Therapy

## 2011-12-15 ENCOUNTER — Telehealth: Payer: Self-pay | Admitting: Oncology

## 2011-12-15 ENCOUNTER — Ambulatory Visit: Payer: Federal, State, Local not specified - PPO

## 2011-12-15 ENCOUNTER — Ambulatory Visit: Payer: Federal, State, Local not specified - PPO | Admitting: Oncology

## 2011-12-15 DIAGNOSIS — IMO0001 Reserved for inherently not codable concepts without codable children: Secondary | ICD-10-CM | POA: Insufficient documentation

## 2011-12-15 DIAGNOSIS — M24519 Contracture, unspecified shoulder: Secondary | ICD-10-CM | POA: Insufficient documentation

## 2011-12-15 DIAGNOSIS — C50919 Malignant neoplasm of unspecified site of unspecified female breast: Secondary | ICD-10-CM | POA: Insufficient documentation

## 2011-12-15 NOTE — Telephone Encounter (Signed)
Late note entry: s/w michelle brown from the chemo room verbally regarding the pt needing to cancel her tx appt on 12/22/2011 and to add one for 01/05/2012 and then q three weeks there after. lmonvm of the pt advising her of the cancelled 12/22/2011 chemo appt.

## 2011-12-16 ENCOUNTER — Ambulatory Visit (HOSPITAL_COMMUNITY)
Admission: RE | Admit: 2011-12-16 | Discharge: 2011-12-16 | Disposition: A | Discharge status: Home / Self Care | Payer: Federal, State, Local not specified - PPO | Source: Ambulatory Visit | Attending: Oncology | Admitting: Oncology

## 2011-12-16 ENCOUNTER — Ambulatory Visit: Payer: Federal, State, Local not specified - PPO | Admitting: Radiation Oncology

## 2011-12-16 ENCOUNTER — Ambulatory Visit: Payer: Federal, State, Local not specified - PPO

## 2011-12-16 DIAGNOSIS — C50919 Malignant neoplasm of unspecified site of unspecified female breast: Secondary | ICD-10-CM | POA: Insufficient documentation

## 2011-12-16 DIAGNOSIS — C50912 Malignant neoplasm of unspecified site of left female breast: Secondary | ICD-10-CM

## 2011-12-16 DIAGNOSIS — I059 Rheumatic mitral valve disease, unspecified: Secondary | ICD-10-CM | POA: Insufficient documentation

## 2011-12-16 DIAGNOSIS — Z9010 Acquired absence of unspecified breast and nipple: Secondary | ICD-10-CM | POA: Insufficient documentation

## 2011-12-16 DIAGNOSIS — E785 Hyperlipidemia, unspecified: Secondary | ICD-10-CM | POA: Insufficient documentation

## 2011-12-16 NOTE — Progress Notes (Signed)
  Echocardiogram 2D Echocardiogram has been performed.  Kirsten Gibson 12/16/2011, 1:09 PM

## 2011-12-17 ENCOUNTER — Other Ambulatory Visit: Payer: Self-pay | Admitting: *Deleted

## 2011-12-17 ENCOUNTER — Ambulatory Visit (HOSPITAL_BASED_OUTPATIENT_CLINIC_OR_DEPARTMENT_OTHER): Payer: Federal, State, Local not specified - PPO

## 2011-12-17 ENCOUNTER — Other Ambulatory Visit: Payer: Self-pay | Admitting: Oncology

## 2011-12-17 ENCOUNTER — Ambulatory Visit: Payer: Federal, State, Local not specified - PPO

## 2011-12-17 VITALS — BP 150/57 | HR 60 | Temp 98.4°F

## 2011-12-17 DIAGNOSIS — C50912 Malignant neoplasm of unspecified site of left female breast: Secondary | ICD-10-CM

## 2011-12-17 DIAGNOSIS — Z5112 Encounter for antineoplastic immunotherapy: Secondary | ICD-10-CM

## 2011-12-17 DIAGNOSIS — C773 Secondary and unspecified malignant neoplasm of axilla and upper limb lymph nodes: Secondary | ICD-10-CM

## 2011-12-17 DIAGNOSIS — C50119 Malignant neoplasm of central portion of unspecified female breast: Secondary | ICD-10-CM

## 2011-12-17 DIAGNOSIS — C50419 Malignant neoplasm of upper-outer quadrant of unspecified female breast: Secondary | ICD-10-CM

## 2011-12-17 MED ORDER — SODIUM CHLORIDE 0.9 % IV SOLN: Freq: Once | INTRAVENOUS | Status: DC

## 2011-12-17 MED ORDER — DIPHENHYDRAMINE HCL 25 MG PO CAPS
50.0000 mg | ORAL_CAPSULE | Freq: Once | ORAL | Status: AC
  Administered 2011-12-17: 50 mg via ORAL

## 2011-12-17 MED ORDER — SODIUM CHLORIDE 0.9 % IJ SOLN
10.0000 mL | INTRAMUSCULAR | Status: DC | PRN
  Administered 2011-12-17: 10 mL
  Filled 2011-12-17: qty 10

## 2011-12-17 MED ORDER — SODIUM CHLORIDE 0.9 % IV SOLN
840.0000 mg | Freq: Once | INTRAVENOUS | Status: AC
  Administered 2011-12-17: 840 mg via INTRAVENOUS
  Filled 2011-12-17: qty 28

## 2011-12-17 MED ORDER — TRASTUZUMAB CHEMO INJECTION 440 MG
6.0000 mg/kg | Freq: Once | INTRAVENOUS | Status: AC
  Administered 2011-12-17: 315 mg via INTRAVENOUS
  Filled 2011-12-17: qty 15

## 2011-12-17 MED ORDER — HEPARIN SOD (PORK) LOCK FLUSH 100 UNIT/ML IV SOLN
500.0000 [IU] | Freq: Once | INTRAVENOUS | Status: AC | PRN
  Administered 2011-12-17: 500 [IU]
  Filled 2011-12-17: qty 5

## 2011-12-17 MED ORDER — ACETAMINOPHEN 325 MG PO TABS
650.0000 mg | ORAL_TABLET | Freq: Once | ORAL | Status: AC
  Administered 2011-12-17: 650 mg via ORAL

## 2011-12-17 NOTE — Patient Instructions (Signed)
[facility] Discharge Instructions for Patients Receiving Chemotherapy  Today you received the following chemotherapy agents Herceptin/Perjeta.  To help prevent nausea and vomiting after your treatment, we encourage you to take your nausea medication as prescribed.   If you develop nausea and vomiting that is not controlled by your nausea medication, call the clinic. If it is after clinic hours your family physician or the after hours number for the clinic or go to the Emergency Department.   BELOW ARE SYMPTOMS THAT SHOULD BE REPORTED IMMEDIATELY:  *FEVER GREATER THAN 100.5 F  *CHILLS WITH OR WITHOUT FEVER  NAUSEA AND VOMITING THAT IS NOT CONTROLLED WITH YOUR NAUSEA MEDICATION  *UNUSUAL SHORTNESS OF BREATH  *UNUSUAL BRUISING OR BLEEDING  TENDERNESS IN MOUTH AND THROAT WITH OR WITHOUT PRESENCE OF ULCERS  *URINARY PROBLEMS  *BOWEL PROBLEMS  UNUSUAL RASH Items with * indicate a potential emergency and should be followed up as soon as possible.  One of the nurses will contact you 24 hours after your treatment. Please let the nurse know about any problems that you may have experienced. Feel free to call the clinic you have any questions or concerns. The clinic phone number is [phone].   I have been informed and understand all the instructions given to me. I know to contact the clinic, my physician, or go to the Emergency Department if any problems should occur. I do not have any questions at this time, but understand that I may call the clinic during office hours   should I have any questions or need assistance in obtaining follow up care.    __________________________________________  _____________  __________ Signature of Patient or Authorized Representative            Date                   Time    __________________________________________ Nurse's Signature    

## 2011-12-21 ENCOUNTER — Telehealth: Payer: Self-pay | Admitting: *Deleted

## 2011-12-21 NOTE — Telephone Encounter (Signed)
Per staff message and POF I have scheduled appts.  JMW  

## 2011-12-22 ENCOUNTER — Ambulatory Visit
Admission: RE | Admit: 2011-12-22 | Discharge: 2011-12-22 | Disposition: A | Discharge status: Home / Self Care | Payer: Federal, State, Local not specified - PPO | Source: Ambulatory Visit | Attending: Radiation Oncology | Admitting: Radiation Oncology

## 2011-12-22 ENCOUNTER — Ambulatory Visit: Payer: Federal, State, Local not specified - PPO

## 2011-12-22 ENCOUNTER — Encounter: Payer: Self-pay | Admitting: Radiation Oncology

## 2011-12-22 VITALS — BP 146/70 | HR 66 | Temp 98.0°F | Resp 18 | Wt 111.8 lb

## 2011-12-22 DIAGNOSIS — Z09 Encounter for follow-up examination after completed treatment for conditions other than malignant neoplasm: Secondary | ICD-10-CM | POA: Insufficient documentation

## 2011-12-22 DIAGNOSIS — C50912 Malignant neoplasm of unspecified site of left female breast: Secondary | ICD-10-CM

## 2011-12-22 DIAGNOSIS — Z9221 Personal history of antineoplastic chemotherapy: Secondary | ICD-10-CM | POA: Insufficient documentation

## 2011-12-22 DIAGNOSIS — C50419 Malignant neoplasm of upper-outer quadrant of unspecified female breast: Secondary | ICD-10-CM

## 2011-12-22 DIAGNOSIS — Z9010 Acquired absence of unspecified breast and nipple: Secondary | ICD-10-CM | POA: Insufficient documentation

## 2011-12-22 DIAGNOSIS — C779 Secondary and unspecified malignant neoplasm of lymph node, unspecified: Secondary | ICD-10-CM | POA: Insufficient documentation

## 2011-12-22 NOTE — Progress Notes (Signed)
See progress note under physician encounter. 

## 2011-12-22 NOTE — Progress Notes (Signed)
Complete PATIENT MEASURE OF DISTRESS worksheet with a score of 1 submitted to social work. Contacted Margit Banda, LCSW reference this patient requesting assistance.

## 2011-12-22 NOTE — Progress Notes (Signed)
Patient presents to the clinic today accompanied by her husband for a follow up new consult with Dr. Michell Heinrich to discuss the role of radiation therapy in the treatment of breast ca. Patient alert and oriented to person, place, and time. No distress noted. Steady gait noted. Pleasant affect noted. Anxiety noted. Patient denies pain at this time. Patient reports that she has already had one physical therapy appointment to aid in resolving cording. Decreased ROM of both upper extremities demonstrated. Patient reports a sore throat and runny nose since surgery, October 1. Patient reports that left chest wall hematoma from bilateral mastectomy has resolved. Prior to diagnosis patient reports weighing 145, during chemo 128, day of surgery 120 and today 111. Patient reports that her appetite and taste has improved however, her husband verbalizes the opposite. Patient reports that she feels "fantastic, the best I felt since diagnosis; I even worked around the house the last few days." Patient denies that her heart races anymore with activity. Patient reports she is "sleeping like a baby" with the aid of nightly xanax and benadryl. Patient denies nausea, vomiting, headache, dizziness or diarrhea. Patient has expressed great concern about skin changes associated with radiation therapy. Patient went on to say that she has not shared her diagnoses with "hardly anyone." Patient expresses that she "can cover up everything that is wrong with her with her clothes presently" but, is concerned she won't be able to cover the skin changes from radiation therapy. Patient verbalizes that Dr. Donnie Coffin has added perjeta to her herceptin regimen. Patient scheduled to see Dr. Donnie Coffin 11/22 then, herceptin 11/27. Contacted Abagail Moscow, LCSW and Zenovia Jarred, RD reference concerns documented above.

## 2011-12-23 ENCOUNTER — Telehealth: Payer: Self-pay | Admitting: *Deleted

## 2011-12-23 NOTE — Addendum Note (Signed)
Encounter addended by: Delynn Flavin, RN on: 12/23/2011  5:45 PM<BR>     Documentation filed: Charges VN

## 2011-12-23 NOTE — Progress Notes (Signed)
Department of Radiation Oncology  Phone:  [phone] Fax:        [phone]   Name: Kirsten Gibson   DOB: 30-Jun-1945  MRN: [id number]    Date: 12/23/2011  Follow Up Visit Note  Diagnosis: Bilateral breast cancer (Right - ypT1c ypN2 M0, Left - ypT75mic ypN0 M0)  Interval History: Kirsten Gibson presents today for routine followup.  She has undergone neoadjuvant chemotherapy and bilateral mastectomies. She had these performed at [facility]. She did not have a tremendous response to chemotherapy and had residual nodal disease including metastatic carcinoma in 3 of 19 lymph nodes and isolated tumor cells in another lymph node on the right. ( So really a total of 3.5/19 positive lymph nodes on the left) She also had residual disease within the breast. On the left she had residual invasive ductal carcinoma as well but her lymph nodes were negative. She is sleeping well and her appetite and energy seems to be improving. She did have a hematoma on the left side but that is resolved. She has had all of her drains removed and has been released to begin radiation. She continues to be highly anxious and has many questions about her diagnosis. She is especially worried that she will be able to cover the skin changes of radiation. She is understandably upset about the prognosis that she and Kirsten Gibson discussed because of her residual lymph nodes. Due to her poor response to neoadjuvant chemotherapy, Kirsten Gibson will be adding pertuzamab to her adjuvant therapy.  Allergies:  Allergies  Allergen Reactions  . Latex Dermatitis and Rash  . Adhesive (Tape)     ALLERGIC TO OP-SITE......USE OP-SITE FLEXIGRID INSTEAD !!!  . Diclofenac Other (See Comments)    Fever, chills  . Naproxen Other (See Comments)    Fever, chills    Medications:  Current Outpatient Prescriptions  Medication Sig Dispense Refill  . ALPRAZolam (XANAX) 0.25 MG tablet Take 2 tablets (0.5 mg total) by mouth at bedtime as needed for sleep.  TAKE ONE AND 1/2 TAB AT BEDTIME AS NEEDED FOR SLEEP  60 tablet  2  . calcium carbonate (OS-CAL) 600 MG TABS Take 600 mg by mouth 3 (three) times daily with meals.      . Cholecalciferol (VITAMIN D3 PO) Take 3,000 mg by mouth daily.       [name] dexamethasone (DECADRON) 4 MG tablet Take 2 tablets two times a day the day before Taxotere. Then take 2 tabs two times a day starting the day after chemo for 3 days.  30 tablet  1  . FLUVIRIN INJ injection       . gabapentin (NEURONTIN) 300 MG capsule 100 mg po tid  90 capsule  1  . iron polysaccharides (NIFEREX) 150 MG capsule Take 1 capsule (150 mg total) by mouth 2 (two) times daily.  60 capsule  1  . lidocaine-prilocaine (EMLA) cream APPLY TO PORT A CATH AS DIRECTED BEFORE CHEMO TREATMENT.  30 g  3  . ondansetron (ZOFRAN) 8 MG tablet Take 1 tablet two times a day starting the day after chemo for 3 days. Then take 1 tab two times a day as needed for nausea or vomiting.  30 tablet  1  . potassium chloride SA (K-DUR,KLOR-CON) 20 MEQ tablet Take two tablets ( ) PO today and one tablet ( ) PO daily until finished.  6 tablet  0  . prochlorperazine (COMPAZINE) 10 MG tablet Take 1 tablet (10 mg total) by mouth every 6 (six) hours as  needed (Nausea or vomiting).  30 tablet  1  . prochlorperazine (COMPAZINE) 25 MG suppository Place 1 suppository (25 mg total) rectally every 12 (twelve) hours as needed for nausea.  12 suppository  3  . ciprofloxacin (CIPRO) 500 MG tablet       . levofloxacin (LEVAQUIN) 500 MG tablet Take 500 mg by mouth daily.      . traZODone (DESYREL) 50 MG tablet Take 1 tablet (50 mg total) by mouth at bedtime.  60 tablet  2   No current facility-administered medications for this encounter.   Facility-Administered Medications Ordered in Other Encounters  Medication Dose Route Frequency Provider Last Rate Last Dose  . lidocaine-prilocaine (EMLA) cream   Topical Once Pierce Crane, MD        Physical Exam:   weight is 111 lb 12.8 oz  (50.712 kg). Her oral temperature is 98 F (36.7 C). Her blood pressure is 146/70 and her pulse is 66. Her respiration is 18.  She is a thin female in no distress but obviously anxious. She has bilateral mastectomy scars which are well-healed. No evidence of hematoma or fluid retention. No evidence of infection.  IMPRESSION: Kirsten Gibson is a 66 y.o. female with bilateral breast cancer status post neoadjuvant chemotherapy with pathologic partial response on the bilateral mastectomies.  PLAN:  I talked to Kirsten Gibson and her husband today for an hour regarding her diagnosis and options for treatment. We discussed the implications of residual nodal disease and the hope that combined HER2 directed therapy will improve this. We discussed that population numbers are merely estimates and that the results of therapy and her ultimate survival of breast cancer recurrence are unknown. We discussed that she has been treated very aggressively and she has done very well with treatment and she is actually doing everything she can do to prevent this cancer from coming back. I discussed with her getting involved in one of our survivorship support groups as I worry that her anxiety levels as treatment comes to an end. I have asked our social worker to check in with her. We also discussed the role of radiation in decreasing the chest wall recurrences in patients who have multiple positive nodes or large tumor size. We discussed treatment of her chest wall and supraclavicular fossa on the right. At this point given her lack of treatment response I think her greatest risk for failure is distantly. She is very concerned about lymphedema and arm motion. For this reason I will not specifically target her axilla. We discussed the results of randomized trial showing a survival benefit to radiation. We discussed the process of simulation and the placement tattoos. We discussed acute effects of treatment including but not limited to fatigue skin  redness and skin breakdown. We discussed the long-term effects including lung damage secondary malignancies and complications with reconstruction. She is very interested in undergoing reconstruction and had hoped to have implant reconstruction. We discussed that with her thin body frame and previous radiation, implant reconstruction on the right may not be a possibility. We discussed that the earliest she can proceed on with reconstruction would be 6 months after radiation was complete. She agreed that at that point she might just be done with the whole thing and not want reconstruction at all. She has signed informed consent and agree to proceed forward with radiation. I placed an order will have my staff call her in the morning with an appointment. I told her I would be fine as starting after Thanksgiving  but she would like to get started as soon as possible. Hopefully we'll be able to fit her in later this week or early next week. She has regular scheduled followup with Kirsten Gibson next week.   Lurline Hare, MD

## 2011-12-23 NOTE — Telephone Encounter (Signed)
CALLED PATIENT TO INFORM OF APPT. WITH NUTRITIONIST ON 12-31-11 AT 10;30 AM, SPOKE WITH PATIENT AND SHE IS AWARE OF THIS APPT.

## 2011-12-23 NOTE — Addendum Note (Signed)
Encounter addended by: Delynn Flavin, RN on: 12/23/2011  3:00 PM<BR>     Documentation filed: Charges VN

## 2011-12-27 ENCOUNTER — Other Ambulatory Visit: Payer: Self-pay | Admitting: *Deleted

## 2011-12-27 DIAGNOSIS — C50419 Malignant neoplasm of upper-outer quadrant of unspecified female breast: Secondary | ICD-10-CM

## 2011-12-28 ENCOUNTER — Encounter (HOSPITAL_COMMUNITY): Payer: Self-pay | Admitting: Dietician

## 2011-12-28 NOTE — Progress Notes (Signed)
Breast Cancer Nutrition Class Note  Date: 12/28/2011  Pt registered, but was a no-show for [facility]'s Breast Cancer Nutrition Class, "Food For Your Fight".  Melody Haver, RD, LDN

## 2011-12-28 NOTE — Addendum Note (Signed)
Encounter addended by: Valeri Sula Mintz Hye Trawick, RN on: 12/28/2011 10:46 AM<BR>     Documentation filed: Charges VN

## 2011-12-28 NOTE — Addendum Note (Signed)
Encounter addended by: Trevan Messman Mintz Trinadee Verhagen, RN on: 12/28/2011  5:57 PM<BR>     Documentation filed: Charges VN

## 2011-12-29 DIAGNOSIS — R768 Other specified abnormal immunological findings in serum: Secondary | ICD-10-CM | POA: Insufficient documentation

## 2011-12-30 ENCOUNTER — Ambulatory Visit
Admission: RE | Admit: 2011-12-30 | Discharge: 2011-12-30 | Disposition: A | Discharge status: Home / Self Care | Payer: Federal, State, Local not specified - PPO | Source: Ambulatory Visit | Attending: Radiation Oncology | Admitting: Radiation Oncology

## 2011-12-30 DIAGNOSIS — C50919 Malignant neoplasm of unspecified site of unspecified female breast: Secondary | ICD-10-CM | POA: Insufficient documentation

## 2011-12-30 DIAGNOSIS — C50419 Malignant neoplasm of upper-outer quadrant of unspecified female breast: Secondary | ICD-10-CM | POA: Insufficient documentation

## 2011-12-30 NOTE — Progress Notes (Signed)
Name: Kirsten Gibson   MRN: [id number]  Date:  12/30/2011  DOB: 1945/09/30  Status:outpatient    DIAGNOSIS: Breast cancer.  CONSENT VERIFIED: yes   SET UP: Patient is setup supine   IMMOBILIZATION:  The following immobilization was used:Custom Moldable Pillow, breast board.   NARRATIVE: [name] was brought to the CT Simulation planning suite.  Identity was confirmed.  All relevant records and images related to the planned course of therapy were reviewed.  Then, the patient was positioned in a stable reproducible clinical set-up for radiation therapy.  Wires were placed to delineate the clinical extent of breast tissue. A wire was placed on the scar as well.  BBs were placed on her drain sites. CT images were obtained.  An isocenter was placed. Skin markings were placed.  The CT images were loaded into the planning software where the target and avoidance structures were contoured.  The radiation prescription was entered and confirmed. The patient was discharged in stable condition and tolerated simulation well.    TREATMENT PLANNING NOTE:  Treatment planning then occurred. I have requested : MLC's, isodose plan, basic dose calculation

## 2011-12-31 ENCOUNTER — Other Ambulatory Visit (HOSPITAL_BASED_OUTPATIENT_CLINIC_OR_DEPARTMENT_OTHER): Payer: Federal, State, Local not specified - PPO | Admitting: Lab

## 2011-12-31 ENCOUNTER — Ambulatory Visit (HOSPITAL_BASED_OUTPATIENT_CLINIC_OR_DEPARTMENT_OTHER): Payer: Federal, State, Local not specified - PPO | Admitting: Oncology

## 2011-12-31 ENCOUNTER — Ambulatory Visit: Payer: Federal, State, Local not specified - PPO | Admitting: Nutrition

## 2011-12-31 VITALS — BP 142/73 | HR 72 | Temp 98.4°F | Resp 20 | Ht 60.0 in | Wt 109.7 lb

## 2011-12-31 DIAGNOSIS — C773 Secondary and unspecified malignant neoplasm of axilla and upper limb lymph nodes: Secondary | ICD-10-CM

## 2011-12-31 DIAGNOSIS — C50912 Malignant neoplasm of unspecified site of left female breast: Secondary | ICD-10-CM

## 2011-12-31 DIAGNOSIS — C50419 Malignant neoplasm of upper-outer quadrant of unspecified female breast: Secondary | ICD-10-CM

## 2011-12-31 DIAGNOSIS — C50119 Malignant neoplasm of central portion of unspecified female breast: Secondary | ICD-10-CM

## 2011-12-31 LAB — CBC WITH DIFFERENTIAL/PLATELET
Basophils Absolute: 0 10*3/uL (ref 0.0–0.1)
EOS%: 1.4 % (ref 0.0–7.0)
HGB: 9.5 g/dL — ABNORMAL LOW (ref 11.6–15.9)
MCH: 32.5 pg (ref 25.1–34.0)
MCHC: 34 g/dL (ref 31.5–36.0)
MCV: 95.4 fL (ref 79.5–101.0)
MONO%: 8.2 % (ref 0.0–14.0)
RBC: 2.92 10*6/uL — ABNORMAL LOW (ref 3.70–5.45)
RDW: 14.5 % (ref 11.2–14.5)

## 2011-12-31 MED ORDER — SODIUM CHLORIDE 0.9 % IV SOLN: 420.0000 mg | Freq: Once | INTRAVENOUS | Status: DC

## 2011-12-31 NOTE — Progress Notes (Signed)
I met with Kirsten Gibson and her husband today regarding continued weight loss. Patient's most recent weight was documented as 111 pounds on November 13 which is decreased from 128 pounds July 15. Current BMI 21.83. Patient reports increased diarrhea for approximately one week. She also has concerns regarding her anemia. She continues to have increased anxiety.  Nutrition diagnosis: Food and nutrition related knowledge deficit continues.  Intervention: I have educated patient and husband on strategies for increasing calories and protein in small feedings throughout the day.  I have also educated her on following a low fiber diet until diarrhea improves. I've given her examples of foods to avoid. I provided fact sheets and reviewed them in detail with patient. I've answered multiple questions. I've also educated patient and husband on high iron foods and how to increase iron absorption. I have referred her back to her physician for answers regarding why she has diarrhea and medication reactions.  Monitoring, goals: Patient has had continued weight loss. She will work to increase calories, but especially protein, for weight maintenance.  Next visit: Patient will contact me for questions or concerns

## 2012-01-03 ENCOUNTER — Encounter: Payer: Self-pay | Admitting: *Deleted

## 2012-01-03 NOTE — Progress Notes (Signed)
[facility] Clinical Social Work  Visual merchandiser met with pt and pt's husband to assess for psychosocial needs and offer additional support.  CSW provided pt with a safe place to share "her story", and assisted her in processing her feeling and emotions associated with her caner diagnosis and treatment.  Pt expressed some anxiety and fear with her diagnosis, and others being informed/aware of her diagnosis.  CSW and pt discussed and identified tools she could use when talking with others about her diagnosis.  Pt stated she was comfortable with "where she was at", but agreed to "think about" issues discussed with CSW.  CSW encouraged pt to call with any needs or concerns.    Tamala Julian, MSW, LCSW Clinical Social Worker [facility] [phone]

## 2012-01-03 NOTE — Progress Notes (Signed)
Hematology and Oncology Follow Up Visit  Kirsten Gibson [id number] 07-Oct-1945 66 y.o. 08/31/11  HPI: [name] is a 66 year old [place] woman with newly diagnosed bilateral breast carcinoma now being treated in the neoadjuvant setting, receiving  q3week TCH, with weekly Herceptin and Neulasta support on day 2.   -Infiltrating ductal carcinoma of the right breast, ER/PR/HER-2 positive with positive axillary lymph node involvement   -Infiltrating ductal carcinoma the left breast, ER/PR positive HER-2 negative.  Interim History:   Patient returns for followup. She had her surgery at [facility] about a month ago she did fairly well. She is here for followup. Pathology was reviewed. She didn't had a total of 3 involved lymph nodes in the right side with one lymph node showing microscopic involvement. There is a residual 1.2 cm tumor in the right. The left side had a wrist small residual tumor as well. She has some fatigue but otherwise is doing well. She has some anxiety skin concerns about the amount of lymph node involvement.   Remainder of the 10 point  review of systems is negative.   Medications:   I have reviewed the patient's current medications.  Allergies:  Allergies  Allergen Reactions  . Latex Dermatitis and Rash  . Adhesive (Tape)     ALLERGIC TO OP-SITE......USE OP-SITE FLEXIGRID INSTEAD !!!  . Diclofenac Other (See Comments)    Fever, chills  . Naproxen Other (See Comments)    Fever, chills    Physical Exam: Filed Vitals:   12/31/11 1221  BP: 142/73  Pulse: 72  Temp: 98.4 F (36.9 C)  Resp: 20    Body mass index is 21.42 kg/(m^2). Weight: 125 lbs. General: Well developed, well nourished, in no acute distress.  EENT: No ocular or oral lesions. No stomatitis.  Respiratory: Lungs are clear to auscultation bilaterally with normal respiratory movement and no accessory muscle use. Cardiac: No murmur, rub or tachycardia. No upper or lower extremity  edema.  GI: Abdomen is soft, no palpable hepatosplenomegaly. No fluid wave. No tenderness. Musculoskeletal: No kyphosis, no tenderness over the spine, ribs or hips. Lymph: No cervical, infraclavicular, axillary or inguinal adenopathy. Neuro: No focal neurological deficits. Psych: Alert and oriented X 3, appropriate mood and affect.   Lab Results: Lab Results  Component Value Date   WBC 5.4 12/31/2011   HGB 9.5* 12/31/2011   HCT 27.8* 12/31/2011   MCV 95.4 12/31/2011   PLT 194 12/31/2011   NEUTROABS 3.1 12/31/2011     Chemistry      Component Value Date/Time   NA 140 12/08/2011 1521   NA 136 09/29/2011 0929   K 3.7 12/08/2011 1521   K 3.5 09/29/2011 0929   CL 103 12/08/2011 1521   CL 99 09/29/2011 0929   CO2 31* 12/08/2011 1521   CO2 26 09/29/2011 0929   BUN 19.0 12/08/2011 1521   BUN 15 09/29/2011 0929   CREATININE 0.8 12/08/2011 1521   CREATININE 0.82 09/29/2011 0929      Component Value Date/Time   CALCIUM 9.5 12/08/2011 1521   CALCIUM 8.8 09/29/2011 0929   ALKPHOS 87 12/08/2011 1521   ALKPHOS 54 09/29/2011 0929   AST 16 12/08/2011 1521   AST 21 09/29/2011 0929   ALT 11 12/08/2011 1521   ALT 22 09/29/2011 0929   BILITOT <0.20 Repeated and Verified 12/08/2011 1521   BILITOT 0.3 09/29/2011 0929     Assessment:  Patient is doing fairly well. We had a fairly long discussion about followup  treatment plans. She will continue to receive Herceptin every 3 weeks. She is due for another dose next week. She will followup echo as well. We will make a referral to radiation oncology. She will continue the Herceptin is noted and begin AI therapy after completion of radiation.  PLAN:  We will continue to follow her every 3 weeks with Herceptin treatment.  Alberto Schoch, md

## 2012-01-05 ENCOUNTER — Ambulatory Visit: Payer: Federal, State, Local not specified - PPO

## 2012-01-07 ENCOUNTER — Ambulatory Visit (HOSPITAL_BASED_OUTPATIENT_CLINIC_OR_DEPARTMENT_OTHER): Payer: Federal, State, Local not specified - PPO

## 2012-01-07 DIAGNOSIS — C773 Secondary and unspecified malignant neoplasm of axilla and upper limb lymph nodes: Secondary | ICD-10-CM

## 2012-01-07 DIAGNOSIS — C50119 Malignant neoplasm of central portion of unspecified female breast: Secondary | ICD-10-CM

## 2012-01-07 DIAGNOSIS — C50419 Malignant neoplasm of upper-outer quadrant of unspecified female breast: Secondary | ICD-10-CM

## 2012-01-07 DIAGNOSIS — Z5112 Encounter for antineoplastic immunotherapy: Secondary | ICD-10-CM

## 2012-01-07 MED ORDER — SODIUM CHLORIDE 0.9 % IV SOLN
Freq: Once | INTRAVENOUS | Status: AC
  Administered 2012-01-07: 14:00:00 via INTRAVENOUS

## 2012-01-07 MED ORDER — ACETAMINOPHEN 325 MG PO TABS
650.0000 mg | ORAL_TABLET | Freq: Once | ORAL | Status: AC
  Administered 2012-01-07: 650 mg via ORAL

## 2012-01-07 MED ORDER — HEPARIN SOD (PORK) LOCK FLUSH 100 UNIT/ML IV SOLN
500.0000 [IU] | Freq: Once | INTRAVENOUS | Status: AC | PRN
  Administered 2012-01-07: 500 [IU]
  Filled 2012-01-07: qty 5

## 2012-01-07 MED ORDER — DIPHENHYDRAMINE HCL 25 MG PO CAPS
50.0000 mg | ORAL_CAPSULE | Freq: Once | ORAL | Status: AC
  Administered 2012-01-07: 50 mg via ORAL

## 2012-01-07 MED ORDER — SODIUM CHLORIDE 0.9 % IV SOLN
420.0000 mg | Freq: Once | INTRAVENOUS | Status: AC
  Administered 2012-01-07: 420 mg via INTRAVENOUS
  Filled 2012-01-07: qty 14

## 2012-01-07 MED ORDER — SODIUM CHLORIDE 0.9 % IJ SOLN
10.0000 mL | INTRAMUSCULAR | Status: DC | PRN
  Administered 2012-01-07: 10 mL
  Filled 2012-01-07: qty 10

## 2012-01-07 MED ORDER — TRASTUZUMAB CHEMO INJECTION 440 MG
6.0000 mg/kg | Freq: Once | INTRAVENOUS | Status: AC
  Administered 2012-01-07: 315 mg via INTRAVENOUS
  Filled 2012-01-07: qty 15

## 2012-01-07 NOTE — Patient Instructions (Addendum)
Pertuzumab injection What is this medicine? PERTUZUMAB is a monoclonal antibody that targets a protein called HER2. HER2 is found in some breast cancers. This medicine can stop cancer cell growth. This medicine is used with other cancer treatments. This medicine may be used for other purposes; ask your health care provider or pharmacist if you have questions. What should I tell my health care provider before I take this medicine? They need to know if you have any of these conditions: -heart disease -heart failure -high blood pressure -history of irregular heart beat -recent or ongoing radiation therapy -an unusual or allergic reaction to pertuzumab, other medicines, foods, dyes, or preservatives -pregnant or trying to get pregnant -breast-feeding How should I use this medicine? This medicine is for infusion into a vein. It is given by a health care professional in a hospital or clinic setting. Talk to your pediatrician regarding the use of this medicine in children. Special care may be needed. Overdosage: If you think you've taken too much of this medicine contact a poison control center or emergency room at once. Overdosage: If you think you have taken too much of this medicine contact a poison control center or emergency room at once. NOTE: This medicine is only for you. Do not share this medicine with others. What if I miss a dose? It is important not to miss your dose. Call your doctor or health care professional if you are unable to keep an appointment. What may interact with this medicine? Interactions are not expected. Give your health care provider a list of all the medicines, herbs, non-prescription drugs, or dietary supplements you use. Also tell them if you smoke, drink alcohol, or use illegal drugs. Some items may interact with your medicine. This list may not describe all possible interactions. Give your health care provider a list of all the medicines, herbs, non-prescription  drugs, or dietary supplements you use. Also tell them if you smoke, drink alcohol, or use illegal drugs. Some items may interact with your medicine. What should I watch for while using this medicine? Your condition will be monitored carefully while you are receiving this medicine. Report any side effects. Continue your course of treatment even though you feel ill unless your doctor tells you to stop. Do not become pregnant while taking this medicine. Women should inform their doctor if they wish to become pregnant or think they might be pregnant. There is a potential for serious side effects to an unborn child. Talk to your health care professional or pharmacist for more information. Do not breast-feed an infant while taking this medicine. Call your doctor or health care professional for advice if you get a fever, chills or sore throat, or other symptoms of a cold or flu. Do not treat yourself. Try to avoid being around people who are sick. You may experience fever, chills, and headache during the infusion. Report any side effects during the infusion to your health care professional. What side effects may I notice from receiving this medicine? Side effects that you should report to your doctor or health care professional as soon as possible: -breathing problems -chest pain or palpitations -dizziness -feeling faint or lightheaded -fever or chills -skin rash, itching or hives -sore throat -swelling of the face, lips, or tongue -swelling of the legs or ankles -unusually weak or tired  Side effects that usually do not require medical attention (Report these to your doctor or health care professional if they continue or are bothersome.): -diarrhea -hair loss -nausea, vomiting -  tiredness This list may not describe all possible side effects. Call your doctor for medical advice about side effects. You may report side effects to FDA at 1-800-FDA-1088. Where should I keep my medicine? This drug is  given in a hospital or clinic and will not be stored at home. NOTE: This sheet is a summary. It may not cover all possible information. If you have questions about this medicine, talk to your doctor, pharmacist, or health care provider.  2013, Elsevier/Gold Standard. (07/22/2010 4:45:56 PM) Trastuzumab injection for infusion What is this medicine? TRASTUZUMAB (tras TOO zoo mab) is a monoclonal antibody. It targets a protein called HER2. This protein is found in some stomach and breast cancers. This medicine can stop cancer cell growth. This medicine may be used with other cancer treatments. This medicine may be used for other purposes; ask your health care provider or pharmacist if you have questions. What should I tell my health care provider before I take this medicine? They need to know if you have any of these conditions: -heart disease -heart failure -infection (especially a virus infection such as chickenpox, cold sores, or herpes) -lung or breathing disease, like asthma -recent or ongoing radiation therapy -an unusual or allergic reaction to trastuzumab, benzyl alcohol, or other medications, foods, dyes, or preservatives -pregnant or trying to get pregnant -breast-feeding How should I use this medicine? This drug is given as an infusion into a vein. It is administered in a hospital or clinic by a specially trained health care professional. Talk to your pediatrician regarding the use of this medicine in children. This medicine is not approved for use in children. Overdosage: If you think you have taken too much of this medicine contact a poison control center or emergency room at once. NOTE: This medicine is only for you. Do not share this medicine with others. What if I miss a dose? It is important not to miss a dose. Call your doctor or health care professional if you are unable to keep an appointment. What may interact with this  medicine? -cyclophosphamide -doxorubicin -warfarin This list may not describe all possible interactions. Give your health care provider a list of all the medicines, herbs, non-prescription drugs, or dietary supplements you use. Also tell them if you smoke, drink alcohol, or use illegal drugs. Some items may interact with your medicine. What should I watch for while using this medicine? Visit your doctor for checks on your progress. Report any side effects. Continue your course of treatment even though you feel ill unless your doctor tells you to stop. Call your doctor or health care professional for advice if you get a fever, chills or sore throat, or other symptoms of a cold or flu. Do not treat yourself. Try to avoid being around people who are sick. You may experience fever, chills and shaking during your first infusion. These effects are usually mild and can be treated with other medicines. Report any side effects during the infusion to your health care professional. Fever and chills usually do not happen with later infusions. What side effects may I notice from receiving this medicine? Side effects that you should report to your doctor or other health care professional as soon as possible: -breathing difficulties -chest pain or palpitations -cough -dizziness or fainting -fever or chills, sore throat -skin rash, itching or hives -swelling of the legs or ankles -unusually weak or tired Side effects that usually do not require medical attention (report to your doctor or other health care professional  if they continue or are bothersome): -loss of appetite -headache -muscle aches -nausea This list may not describe all possible side effects. Call your doctor for medical advice about side effects. You may report side effects to FDA at 1-800-FDA-1088. Where should I keep my medicine? This drug is given in a hospital or clinic and will not be stored at home. NOTE: This sheet is a summary. It  may not cover all possible information. If you have questions about this medicine, talk to your doctor, pharmacist, or health care provider.  2012, Elsevier/Gold Standard. (11/29/2008 1:43:15 PM)

## 2012-01-10 ENCOUNTER — Ambulatory Visit
Admission: RE | Admit: 2012-01-10 | Discharge: 2012-01-10 | Disposition: A | Discharge status: Home / Self Care | Payer: Federal, State, Local not specified - PPO | Source: Ambulatory Visit | Attending: Radiation Oncology | Admitting: Radiation Oncology

## 2012-01-10 ENCOUNTER — Encounter: Payer: Self-pay | Admitting: Radiation Oncology

## 2012-01-11 ENCOUNTER — Ambulatory Visit
Admission: RE | Admit: 2012-01-11 | Discharge: 2012-01-11 | Disposition: A | Discharge status: Home / Self Care | Payer: Federal, State, Local not specified - PPO | Source: Ambulatory Visit | Attending: Radiation Oncology | Admitting: Radiation Oncology

## 2012-01-11 ENCOUNTER — Encounter: Payer: Self-pay | Admitting: Radiation Oncology

## 2012-01-11 VITALS — BP 118/79 | HR 73 | Temp 97.8°F | Wt 109.3 lb

## 2012-01-11 DIAGNOSIS — C50419 Malignant neoplasm of upper-outer quadrant of unspecified female breast: Secondary | ICD-10-CM

## 2012-01-11 MED ORDER — RADIAPLEXRX EX GEL
Freq: Once | CUTANEOUS | Status: AC
  Administered 2012-01-11: 13:00:00 via TOPICAL

## 2012-01-11 MED ORDER — ALRA NON-METALLIC DEODORANT (RAD-ONC)
1.0000 "application " | Freq: Once | TOPICAL | Status: AC
  Administered 2012-01-11: 1 via TOPICAL

## 2012-01-11 NOTE — Progress Notes (Addendum)
First fraction to right chestwall.  Education today regarding management of her skin, fatigue and pain.  Given Radiaplex Gel and Alra Deodorant with instructions to apply Gel BID after treatment.  Concerned about treatment of her Bartholomew region and how far up on the neck her field covers.

## 2012-01-11 NOTE — Progress Notes (Signed)
Given Radiation Therapy and You booklet with appropriate pages marked.  Teachback

## 2012-01-11 NOTE — Progress Notes (Signed)
Weekly Management Note Current Dose:  1.8 Gy  Projected Dose:50.4  Gy   Narrative:  The patient presents for routine under treatment assessment.  CBCT/MVCT images/Port film x-rays were reviewed.  The chart was checked.Felt her ziphoid and wondered if this was normal. Worried about her appearance during radiation and after.  Would like to mover her med onc appts back to Tuesday if possible.  Physical Findings: Weight: 109 lb 4.8 oz (49.578 kg). Unchanged  Impression:  The patient is tolerating radiation.  Plan:  Continue treatment as planned. Discussed her treatment fields and possible side effects. Post sim education performed. OK to move med onc appts to time convenient for her from my perspective.

## 2012-01-12 ENCOUNTER — Ambulatory Visit
Admission: RE | Admit: 2012-01-12 | Discharge: 2012-01-12 | Disposition: A | Discharge status: Home / Self Care | Payer: Federal, State, Local not specified - PPO | Source: Ambulatory Visit | Attending: Radiation Oncology | Admitting: Radiation Oncology

## 2012-01-13 ENCOUNTER — Ambulatory Visit
Admission: RE | Admit: 2012-01-13 | Discharge: 2012-01-13 | Disposition: A | Discharge status: Home / Self Care | Payer: Federal, State, Local not specified - PPO | Source: Ambulatory Visit | Attending: Radiation Oncology | Admitting: Radiation Oncology

## 2012-01-13 ENCOUNTER — Other Ambulatory Visit: Payer: Self-pay | Admitting: *Deleted

## 2012-01-13 DIAGNOSIS — C50912 Malignant neoplasm of unspecified site of left female breast: Secondary | ICD-10-CM

## 2012-01-14 ENCOUNTER — Ambulatory Visit
Admission: RE | Admit: 2012-01-14 | Discharge: 2012-01-14 | Disposition: A | Discharge status: Home / Self Care | Payer: Federal, State, Local not specified - PPO | Source: Ambulatory Visit | Attending: Radiation Oncology | Admitting: Radiation Oncology

## 2012-01-14 ENCOUNTER — Other Ambulatory Visit (HOSPITAL_BASED_OUTPATIENT_CLINIC_OR_DEPARTMENT_OTHER): Payer: Federal, State, Local not specified - PPO | Admitting: Lab

## 2012-01-14 ENCOUNTER — Other Ambulatory Visit: Payer: Self-pay | Admitting: *Deleted

## 2012-01-14 ENCOUNTER — Ambulatory Visit (HOSPITAL_BASED_OUTPATIENT_CLINIC_OR_DEPARTMENT_OTHER): Payer: Federal, State, Local not specified - PPO | Admitting: Oncology

## 2012-01-14 VITALS — BP 128/91 | HR 91 | Temp 97.8°F | Resp 20 | Ht 60.0 in | Wt 109.3 lb

## 2012-01-14 DIAGNOSIS — C773 Secondary and unspecified malignant neoplasm of axilla and upper limb lymph nodes: Secondary | ICD-10-CM

## 2012-01-14 DIAGNOSIS — C50419 Malignant neoplasm of upper-outer quadrant of unspecified female breast: Secondary | ICD-10-CM

## 2012-01-14 DIAGNOSIS — D649 Anemia, unspecified: Secondary | ICD-10-CM

## 2012-01-14 DIAGNOSIS — C50912 Malignant neoplasm of unspecified site of left female breast: Secondary | ICD-10-CM

## 2012-01-14 DIAGNOSIS — C50119 Malignant neoplasm of central portion of unspecified female breast: Secondary | ICD-10-CM

## 2012-01-14 DIAGNOSIS — C50919 Malignant neoplasm of unspecified site of unspecified female breast: Secondary | ICD-10-CM

## 2012-01-14 LAB — CBC WITH DIFFERENTIAL/PLATELET
Eosinophils Absolute: 0.1 10*3/uL (ref 0.0–0.5)
HCT: 28.2 % — ABNORMAL LOW (ref 34.8–46.6)
LYMPH%: 36.4 % (ref 14.0–49.7)
MCHC: 32.3 g/dL (ref 31.5–36.0)
MCV: 92.5 fL (ref 79.5–101.0)
MONO#: 0.4 10*3/uL (ref 0.1–0.9)
MONO%: 8.9 % (ref 0.0–14.0)
NEUT#: 2.6 10*3/uL (ref 1.5–6.5)
NEUT%: 51.5 % (ref 38.4–76.8)
Platelets: 232 10*3/uL (ref 145–400)
RBC: 3.05 10*6/uL — ABNORMAL LOW (ref 3.70–5.45)
WBC: 5 10*3/uL (ref 3.9–10.3)

## 2012-01-14 MED ORDER — ALPRAZOLAM 0.5 MG PO TABS: ORAL_TABLET | ORAL | Status: DC

## 2012-01-14 NOTE — Telephone Encounter (Signed)
Per staff message and POF I have scheduled appts.  JMW  

## 2012-01-14 NOTE — Progress Notes (Signed)
Hematology and Oncology Follow Up Visit  ADDALYNE VANDEHEI [id number] [birth year] 66 y.o. 08/31/11  HPI: Mrs. Blish is a 66 year old [place] woman with newly diagnosed bilateral breast carcinoma now being treated in the neoadjuvant setting, receiving  q3week TCH, with weekly Herceptin and Neulasta support on day 2.   -Infiltrating ductal carcinoma of the right breast, ER/PR/HER-2 positive with positive axillary lymph node involvement   -Infiltrating ductal carcinoma the left breast, ER/PR positive HER-2 negative.  Interim History:   Patient returns for followup. She has had 4 radiation treatments . She has no other concerns. She detects subjective sense of palpitations intermittently. She has no other complaints.   Remainder of the 10 point  review of systems is negative.   Medications:   I have reviewed the patient's current medications.  Allergies:  Allergies  Allergen Reactions  . Latex Dermatitis and Rash  . Adhesive (Tape)     ALLERGIC TO OP-SITE......USE OP-SITE FLEXIGRID INSTEAD !!!  . Diclofenac Other (See Comments)    Fever, chills  . Naproxen Other (See Comments)    Fever, chills    Physical Exam: Filed Vitals:   01/14/12 1241  BP: 128/91  Pulse: 91  Temp: 97.8 F (36.6 C)  Resp: 20    Body mass index is 21.35 kg/(m^2). Weight: 125 lbs. General: Well developed, well nourished, in no acute distress.  EENT: No ocular or oral lesions. No stomatitis.  Respiratory: Lungs are clear to auscultation bilaterally with normal respiratory movement and no accessory muscle use. Cardiac: No murmur, rub or tachycardia. No upper or lower extremity edema.  GI: Abdomen is soft, no palpable hepatosplenomegaly. No fluid wave. No tenderness. Musculoskeletal: No kyphosis, no tenderness over the spine, ribs or hips. Lymph: No cervical, infraclavicular, axillary or inguinal adenopathy. Neuro: No focal neurological deficits. Psych: Alert and oriented X 3, appropriate  mood and affect.   Lab Results: Lab Results  Component Value Date   WBC 5.0 01/14/2012   HGB 9.1* 01/14/2012   HCT 28.2* 01/14/2012   MCV 92.5 01/14/2012   PLT 232 01/14/2012   NEUTROABS 2.6 01/14/2012     Chemistry      Component Value Date/Time   NA 140 12/08/2011 1521   NA 136 09/29/2011 0929   K 3.7 12/08/2011 1521   K 3.5 09/29/2011 0929   CL 103 12/08/2011 1521   CL 99 09/29/2011 0929   CO2 31* 12/08/2011 1521   CO2 26 09/29/2011 0929   BUN 19.0 12/08/2011 1521   BUN 15 09/29/2011 0929   CREATININE 0.8 12/08/2011 1521   CREATININE 0.82 09/29/2011 0929      Component Value Date/Time   CALCIUM 9.5 12/08/2011 1521   CALCIUM 8.8 09/29/2011 0929   ALKPHOS 87 12/08/2011 1521   ALKPHOS 54 09/29/2011 0929   AST 16 12/08/2011 1521   AST 21 09/29/2011 0929   ALT 11 12/08/2011 1521   ALT 22 09/29/2011 0929   BILITOT <0.20 Repeated and Verified 12/08/2011 1521   BILITOT 0.3 09/29/2011 0929     Assessment:  Patient is doing fairly well.she continues to be anemic, I have suggested increasing iron intake to bid. PLAN:  We will continue to follow her every 3 weeks with Herceptin and perjeta. treatment.  Orvin Netter, md

## 2012-01-17 ENCOUNTER — Ambulatory Visit
Admission: RE | Admit: 2012-01-17 | Discharge: 2012-01-17 | Disposition: A | Discharge status: Home / Self Care | Payer: Federal, State, Local not specified - PPO | Source: Ambulatory Visit | Attending: Radiation Oncology | Admitting: Radiation Oncology

## 2012-01-18 ENCOUNTER — Ambulatory Visit
Admission: RE | Admit: 2012-01-18 | Discharge: 2012-01-18 | Disposition: A | Discharge status: Home / Self Care | Payer: Federal, State, Local not specified - PPO | Source: Ambulatory Visit | Attending: Radiation Oncology | Admitting: Radiation Oncology

## 2012-01-18 ENCOUNTER — Encounter: Payer: Self-pay | Admitting: Radiation Oncology

## 2012-01-18 VITALS — BP 136/74 | HR 74 | Resp 18 | Wt 105.5 lb

## 2012-01-18 DIAGNOSIS — C50419 Malignant neoplasm of upper-outer quadrant of unspecified female breast: Secondary | ICD-10-CM

## 2012-01-18 NOTE — Progress Notes (Signed)
Patient presents to the clinic today unaccompanied for PUT with Dr. Michell Heinrich. Patient alert and oriented to person, place, and time. No distress noted. Steady gait noted. Pleasant affect noted. Patient denies pain at this time. Patient reports using Radiaplex and Alra as directed. Reinforced skin care. Patient complains of dry itchy skin anterior and posterior hips and low back.  Also, patient reports blurred vision in left eye. Encouraged patient to contact opthalmologist reference this matter. Patient schedule for next dose of herceptin 01/27/2012 but, last one was the last week of November. Patient reports a little "brain fog." Reported all findings to Dr. Michell Heinrich.

## 2012-01-18 NOTE — Progress Notes (Signed)
Weekly Management Note Current Dose:  10.8 Gy  Projected Dose: 60.4 Gy   Narrative:  The patient presents for routine under treatment assessment.  CBCT/MVCT images/Port film x-rays were reviewed.  The chart was checked. Doing well. Has a rash over pelvis and abdomen. Getting better. Some left eye blurry vision. No headaches.  Weight stable. Pleased that her skin is not worse yet.   Physical Findings: Weight: 105 lb 8 oz (47.854 kg). Unchanged. Slight red rash over hips bilaterally. Does not look like zoster.   Impression:  The patient is tolerating radiation.  Plan:  Continue treatment as planned. Rash likely dry skin. Try moisturizer. ? Blurry vision due to chemo. Asked to f/u with optho.

## 2012-01-19 ENCOUNTER — Ambulatory Visit
Admission: RE | Admit: 2012-01-19 | Discharge: 2012-01-19 | Disposition: A | Discharge status: Home / Self Care | Payer: Federal, State, Local not specified - PPO | Source: Ambulatory Visit | Attending: Radiation Oncology | Admitting: Radiation Oncology

## 2012-01-20 ENCOUNTER — Ambulatory Visit
Admission: RE | Admit: 2012-01-20 | Discharge: 2012-01-20 | Disposition: A | Discharge status: Home / Self Care | Payer: Federal, State, Local not specified - PPO | Source: Ambulatory Visit | Attending: Radiation Oncology | Admitting: Radiation Oncology

## 2012-01-21 ENCOUNTER — Encounter: Payer: Self-pay | Admitting: Radiation Oncology

## 2012-01-21 ENCOUNTER — Ambulatory Visit
Admission: RE | Admit: 2012-01-21 | Discharge: 2012-01-21 | Disposition: A | Discharge status: Home / Self Care | Payer: Federal, State, Local not specified - PPO | Source: Ambulatory Visit | Attending: Radiation Oncology | Admitting: Radiation Oncology

## 2012-01-24 ENCOUNTER — Ambulatory Visit
Admission: RE | Admit: 2012-01-24 | Discharge: 2012-01-24 | Disposition: A | Discharge status: Home / Self Care | Payer: Federal, State, Local not specified - PPO | Source: Ambulatory Visit | Attending: Radiation Oncology | Admitting: Radiation Oncology

## 2012-01-25 ENCOUNTER — Encounter: Payer: Self-pay | Admitting: Radiation Oncology

## 2012-01-25 ENCOUNTER — Ambulatory Visit
Admission: RE | Admit: 2012-01-25 | Discharge: 2012-01-25 | Disposition: A | Discharge status: Home / Self Care | Payer: Federal, State, Local not specified - PPO | Source: Ambulatory Visit | Attending: Radiation Oncology | Admitting: Radiation Oncology

## 2012-01-25 VITALS — BP 139/77 | HR 73 | Resp 18 | Wt 107.3 lb

## 2012-01-25 DIAGNOSIS — C50419 Malignant neoplasm of upper-outer quadrant of unspecified female breast: Secondary | ICD-10-CM

## 2012-01-25 MED ORDER — RADIAPLEXRX EX GEL
Freq: Once | CUTANEOUS | Status: AC
  Administered 2012-01-25: 15:00:00 via TOPICAL

## 2012-01-25 NOTE — Progress Notes (Signed)
Weekly Management Note Current Dose:  19.8 Gy  Projected Dose: 60.4 Gy   Narrative:  The patient presents for routine under treatment assessment.  CBCT/MVCT images/Port film x-rays were reviewed.  The chart was checked.  Doing well. Questions about her scar treatment. Some fatigue especially after treatment yesterday. Itching continues on back and in treatment area.   Physical Findings: Weight: 107 lb 4.8 oz (48.671 kg). Mild dermatitis.   Impression:  The patient is tolerating radiation.  Plan:  Continue treatment as planned. Continue radiaplex. Add hydrocortisone if needed.

## 2012-01-25 NOTE — Progress Notes (Signed)
Patient accompanied by her husband for PUT with Dr. Michell Heinrich. Only very faint hyperpigmentation of right chest wall noted with mild dermatitis. Encouraged patient to continue to use Radiaplex bid and apply hydrocortisone 1% to area of dermatitis. Patient verbalized understanding. Patient reports fatigue.

## 2012-01-25 NOTE — Addendum Note (Signed)
Encounter addended by: Agnes Lawrence, RN on: 01/25/2012  2:53 PM<BR>     Documentation filed: Inpatient MAR, Orders

## 2012-01-26 ENCOUNTER — Ambulatory Visit
Admission: RE | Admit: 2012-01-26 | Discharge: 2012-01-26 | Disposition: A | Discharge status: Home / Self Care | Payer: Federal, State, Local not specified - PPO | Source: Ambulatory Visit | Attending: Radiation Oncology | Admitting: Radiation Oncology

## 2012-01-27 ENCOUNTER — Other Ambulatory Visit: Payer: Self-pay | Admitting: *Deleted

## 2012-01-27 ENCOUNTER — Ambulatory Visit
Admission: RE | Admit: 2012-01-27 | Discharge: 2012-01-27 | Disposition: A | Discharge status: Home / Self Care | Payer: Federal, State, Local not specified - PPO | Source: Ambulatory Visit | Attending: Radiation Oncology | Admitting: Radiation Oncology

## 2012-01-27 DIAGNOSIS — C50419 Malignant neoplasm of upper-outer quadrant of unspecified female breast: Secondary | ICD-10-CM

## 2012-01-28 ENCOUNTER — Telehealth: Payer: Self-pay | Admitting: Oncology

## 2012-01-28 ENCOUNTER — Ambulatory Visit
Admission: RE | Admit: 2012-01-28 | Discharge: 2012-01-28 | Disposition: A | Discharge status: Home / Self Care | Payer: Federal, State, Local not specified - PPO | Source: Ambulatory Visit | Attending: Radiation Oncology | Admitting: Radiation Oncology

## 2012-01-28 ENCOUNTER — Ambulatory Visit (HOSPITAL_BASED_OUTPATIENT_CLINIC_OR_DEPARTMENT_OTHER): Payer: Federal, State, Local not specified - PPO | Admitting: Oncology

## 2012-01-28 ENCOUNTER — Other Ambulatory Visit (HOSPITAL_BASED_OUTPATIENT_CLINIC_OR_DEPARTMENT_OTHER): Payer: Federal, State, Local not specified - PPO | Admitting: Lab

## 2012-01-28 ENCOUNTER — Ambulatory Visit (HOSPITAL_BASED_OUTPATIENT_CLINIC_OR_DEPARTMENT_OTHER): Payer: Federal, State, Local not specified - PPO

## 2012-01-28 ENCOUNTER — Encounter: Payer: Self-pay | Admitting: Oncology

## 2012-01-28 VITALS — BP 134/76 | HR 78 | Temp 98.5°F | Resp 20 | Ht 60.0 in | Wt 110.2 lb

## 2012-01-28 DIAGNOSIS — C50419 Malignant neoplasm of upper-outer quadrant of unspecified female breast: Secondary | ICD-10-CM

## 2012-01-28 DIAGNOSIS — C50119 Malignant neoplasm of central portion of unspecified female breast: Secondary | ICD-10-CM

## 2012-01-28 DIAGNOSIS — D649 Anemia, unspecified: Secondary | ICD-10-CM

## 2012-01-28 DIAGNOSIS — C773 Secondary and unspecified malignant neoplasm of axilla and upper limb lymph nodes: Secondary | ICD-10-CM

## 2012-01-28 DIAGNOSIS — Z5112 Encounter for antineoplastic immunotherapy: Secondary | ICD-10-CM

## 2012-01-28 LAB — CBC WITH DIFFERENTIAL/PLATELET
BASO%: 0.9 % (ref 0.0–2.0)
EOS%: 1.9 % (ref 0.0–7.0)
HCT: 28.6 % — ABNORMAL LOW (ref 34.8–46.6)
LYMPH%: 24.9 % (ref 14.0–49.7)
MCH: 31 pg (ref 25.1–34.0)
MCHC: 33.5 g/dL (ref 31.5–36.0)
MONO%: 11 % (ref 0.0–14.0)
NEUT%: 61.3 % (ref 38.4–76.8)
Platelets: 180 10*3/uL (ref 145–400)
RBC: 3.09 10*6/uL — ABNORMAL LOW (ref 3.70–5.45)
WBC: 4.6 10*3/uL (ref 3.9–10.3)

## 2012-01-28 MED ORDER — HEPARIN SOD (PORK) LOCK FLUSH 100 UNIT/ML IV SOLN
500.0000 [IU] | Freq: Once | INTRAVENOUS | Status: AC | PRN
  Administered 2012-01-28: 500 [IU]
  Filled 2012-01-28: qty 5

## 2012-01-28 MED ORDER — ACETAMINOPHEN 325 MG PO TABS
650.0000 mg | ORAL_TABLET | Freq: Once | ORAL | Status: AC
  Administered 2012-01-28: 650 mg via ORAL

## 2012-01-28 MED ORDER — SODIUM CHLORIDE 0.9 % IV SOLN
Freq: Once | INTRAVENOUS | Status: AC
  Administered 2012-01-28: 15:00:00 via INTRAVENOUS

## 2012-01-28 MED ORDER — SODIUM CHLORIDE 0.9 % IJ SOLN
10.0000 mL | INTRAMUSCULAR | Status: DC | PRN
  Administered 2012-01-28: 10 mL
  Filled 2012-01-28: qty 10

## 2012-01-28 MED ORDER — PERTUZUMAB CHEMO INJECTION 420 MG/14ML
420.0000 mg | Freq: Once | INTRAVENOUS | Status: AC
  Administered 2012-01-28: 420 mg via INTRAVENOUS
  Filled 2012-01-28: qty 14

## 2012-01-28 MED ORDER — DIPHENHYDRAMINE HCL 25 MG PO CAPS
50.0000 mg | ORAL_CAPSULE | Freq: Once | ORAL | Status: AC
  Administered 2012-01-28: 50 mg via ORAL

## 2012-01-28 MED ORDER — ANASTROZOLE 1 MG PO TABS: 1.0000 mg | ORAL_TABLET | Freq: Every day | ORAL | Status: DC

## 2012-01-28 MED ORDER — TRASTUZUMAB CHEMO INJECTION 440 MG
6.0000 mg/kg | Freq: Once | INTRAVENOUS | Status: AC
  Administered 2012-01-28: 315 mg via INTRAVENOUS
  Filled 2012-01-28: qty 15

## 2012-01-28 NOTE — Progress Notes (Signed)
Pt was in to see Dr. Donnie Kirsten Gibson today.  I spoke with her about transitioning her care to Dr. Darnelle Catalan.   She understands and the scheduler printed the Jan Schedule for her so she has it.   Patient and husband verbalized understanding.

## 2012-01-28 NOTE — Patient Instructions (Addendum)
[facility] Discharge Instructions for Patients Receiving Chemotherapy  Today you received the following chemotherapy agents Herceptin and Perjeta  To help prevent nausea and vomiting after your treatment, we encourage you to take your nausea medication as prescribed.   If you develop nausea and vomiting that is not controlled by your nausea medication, call the clinic. If it is after clinic hours your family physician or the after hours number for the clinic or go to the Emergency Department.   BELOW ARE SYMPTOMS THAT SHOULD BE REPORTED IMMEDIATELY:  *FEVER GREATER THAN 100.5 F  *CHILLS WITH OR WITHOUT FEVER  NAUSEA AND VOMITING THAT IS NOT CONTROLLED WITH YOUR NAUSEA MEDICATION  *UNUSUAL SHORTNESS OF BREATH  *UNUSUAL BRUISING OR BLEEDING  TENDERNESS IN MOUTH AND THROAT WITH OR WITHOUT PRESENCE OF ULCERS  *URINARY PROBLEMS  *BOWEL PROBLEMS  UNUSUAL RASH Items with * indicate a potential emergency and should be followed up as soon as possible.  Feel free to call the clinic you have any questions or concerns. The clinic phone number is [phone].   I have been informed and understand all the instructions given to me. I know to contact the clinic, my physician, or go to the Emergency Department if any problems should occur. I do not have any questions at this time, but understand that I may call the clinic during office hours   should I have any questions or need assistance in obtaining follow up care.    __________________________________________  _____________  __________ Signature of Patient or Authorized Representative            Date                   Time    __________________________________________ Nurse's Signature

## 2012-01-28 NOTE — Telephone Encounter (Signed)
Pt inquired if she needs to see Dr. Donnie Coffin 02/04/12 before herceptin next week with labs.   Dr. Donnie Coffin stated her labs are fine- and she is receiving herceptin so she does not need to be seen.  She is scheduled to see Dr. Darnelle Catalan in Jan 2014 to transition her care.

## 2012-01-30 NOTE — Progress Notes (Signed)
Hematology and Oncology Follow Up Visit  Kirsten Gibson [id number] [birth year] 66 y.o. 08/31/11  HPI: Kirsten Gibson is a 66 year old [place] woman with   -Infiltrating ductal carcinoma of the right breast, ER/PR/HER-2 positive with positive axillary lymph node involvement   -Infiltrating ductal carcinoma the left breast, ER/PR positive HER-2 negative. S/p TCH x 6 given in the neoadjuvant setting followed by bilateral MRM, with residual  YPT1cN2a, er+,on the right  with yPTmicNo , on the left. She is currently receiving herceptin/perjeta as well as xrt. In the adjuvant setting. She is having mild skin irritation from the radiation. Radiaition is due to be over 02/28/12. She will be receiving 1 yr of herceptin and 6 cycles of perjeta. Anticipate starting AI after xrt.   Interim History:   Patient returns for followup. She has had 4 radiation treatments . She has no other concerns. She detects subjective sense of palpitations intermittently. She has no other complaints.   Remainder of the 10 point  review of systems is negative.   Medications:   I have reviewed the patient's current medications.  Allergies:  Allergies  Allergen Reactions  . Latex Dermatitis and Rash  . Adhesive (Tape)     ALLERGIC TO OP-SITE......USE OP-SITE FLEXIGRID INSTEAD !!!  . Diclofenac Other (See Comments)    Fever, chills  . Naproxen Other (See Comments)    Fever, chills    Physical Exam: Filed Vitals:   01/28/12 1317  BP: 134/76  Pulse: 78  Temp: 98.5 F (36.9 C)  Resp: 20    Body mass index is 21.52 kg/(m^2). Weight: 125 lbs. General: Well developed, well nourished, in no acute distress.  EENT: No ocular or oral lesions. No stomatitis.  Respiratory: Lungs are clear to auscultation bilaterally with normal respiratory movement and no accessory muscle use. Cardiac: No murmur, rub or tachycardia. No upper or lower extremity edema.  GI: Abdomen is soft, no palpable hepatosplenomegaly. No  fluid wave. No tenderness. Musculoskeletal: No kyphosis, no tenderness over the spine, ribs or hips. Lymph: No cervical, infraclavicular, axillary or inguinal adenopathy. Neuro: No focal neurological deficits. Psych: Alert and oriented X 3, appropriate mood and affect.   Lab Results: Lab Results  Component Value Date   WBC 4.6 01/28/2012   HGB 9.6* 01/28/2012   HCT 28.6* 01/28/2012   MCV 92.5 01/28/2012   PLT 180 01/28/2012   NEUTROABS 2.8 01/28/2012     Chemistry      Component Value Date/Time   NA 140 12/08/2011 1521   NA 136 09/29/2011 0929   K 3.7 12/08/2011 1521   K 3.5 09/29/2011 0929   CL 103 12/08/2011 1521   CL 99 09/29/2011 0929   CO2 31* 12/08/2011 1521   CO2 26 09/29/2011 0929   BUN 19.0 12/08/2011 1521   BUN 15 09/29/2011 0929   CREATININE 0.8 12/08/2011 1521   CREATININE 0.82 09/29/2011 0929      Component Value Date/Time   CALCIUM 9.5 12/08/2011 1521   CALCIUM 8.8 09/29/2011 0929   ALKPHOS 87 12/08/2011 1521   ALKPHOS 54 09/29/2011 0929   AST 16 12/08/2011 1521   AST 21 09/29/2011 0929   ALT 11 12/08/2011 1521   ALT 22 09/29/2011 0929   BILITOT <0.20 Repeated and Verified 12/08/2011 1521   BILITOT 0.3 09/29/2011 4696     Assessment:  Patient is doing fairly well.she continues to be anemic,but her energy level is good. She has routine f/u 2 Echo , scheduled q 3 months. We  discussed anticipated risk of recurrence given # of involved nodes.Given her ER+ status, I think long term AI therapy will be appropriate.  PLAN:  We will continue to follow her every 3 weeks with Herceptin and perjeta. treatment.  Kirsten Bjelland, md

## 2012-01-31 ENCOUNTER — Ambulatory Visit
Admission: RE | Admit: 2012-01-31 | Discharge: 2012-01-31 | Disposition: A | Discharge status: Home / Self Care | Payer: Federal, State, Local not specified - PPO | Source: Ambulatory Visit | Attending: Radiation Oncology | Admitting: Radiation Oncology

## 2012-01-31 ENCOUNTER — Encounter: Payer: Self-pay | Admitting: Radiation Oncology

## 2012-01-31 VITALS — BP 127/69 | HR 78 | Temp 97.9°F | Wt 107.6 lb

## 2012-01-31 DIAGNOSIS — C50419 Malignant neoplasm of upper-outer quadrant of unspecified female breast: Secondary | ICD-10-CM

## 2012-01-31 DIAGNOSIS — C50912 Malignant neoplasm of unspecified site of left female breast: Secondary | ICD-10-CM

## 2012-01-31 LAB — COMPREHENSIVE METABOLIC PANEL
AST: 23 U/L (ref 0–37)
Albumin: 3.5 g/dL (ref 3.5–5.2)
Alkaline Phosphatase: 84 U/L (ref 39–117)
Glucose, Bld: 121 mg/dL — ABNORMAL HIGH (ref 70–99)
Potassium: 3.5 mEq/L (ref 3.5–5.3)
Sodium: 139 mEq/L (ref 135–145)
Total Bilirubin: 0.2 mg/dL — ABNORMAL LOW (ref 0.3–1.2)
Total Protein: 6.3 g/dL (ref 6.0–8.3)

## 2012-01-31 NOTE — Progress Notes (Addendum)
15 fractions to right chest wall.  Note red rash-like appearance in the upper inner portion of field and on the shoulder blade.  Using 1% hydrocortisone cream and Biafine in treatment filed.  States she feels a "tiny bit tired."

## 2012-01-31 NOTE — Progress Notes (Addendum)
   Weekly Management Note:  Outpatient Current Dose:  27 Gy  Projected Dose: 50.4 Gy   Narrative:  The patient presents for routine under treatment assessment.  CBCT/MVCT images/Port film x-rays were reviewed.  The chart was checked. She is tearful today, worried about her prognosis.  Skin mildly irritated  Physical Findings:  weight is 107 lb 9.6 oz (48.807 kg). Her temperature is 97.9 F (36.6 C). Her blood pressure is 127/69 and her pulse is 78.  erythema over her right chest wall.   Impression:  The patient is tolerating radiotherapy.  Plan:  Continue radiotherapy as planned. I talked to her about a referral to our chaplain. She is interested in talking to her. I told her I would let Dr.  Michell Heinrich know about how she is having difficulty emotionally, thinking about her prognosis. A long visit was spent with the patient today, providing emotional support.  ________________________________   Lonie Peak, M.D.      [name]

## 2012-02-01 ENCOUNTER — Other Ambulatory Visit: Payer: Self-pay | Admitting: *Deleted

## 2012-02-01 ENCOUNTER — Ambulatory Visit
Admission: RE | Admit: 2012-02-01 | Discharge: 2012-02-01 | Disposition: A | Discharge status: Home / Self Care | Payer: Federal, State, Local not specified - PPO | Source: Ambulatory Visit | Attending: Radiation Oncology | Admitting: Radiation Oncology

## 2012-02-01 DIAGNOSIS — C50419 Malignant neoplasm of upper-outer quadrant of unspecified female breast: Secondary | ICD-10-CM

## 2012-02-03 ENCOUNTER — Ambulatory Visit
Admission: RE | Admit: 2012-02-03 | Discharge: 2012-02-03 | Disposition: A | Discharge status: Home / Self Care | Payer: Federal, State, Local not specified - PPO | Source: Ambulatory Visit | Attending: Radiation Oncology | Admitting: Radiation Oncology

## 2012-02-04 ENCOUNTER — Ambulatory Visit
Admission: RE | Admit: 2012-02-04 | Discharge: 2012-02-04 | Disposition: A | Discharge status: Home / Self Care | Payer: Federal, State, Local not specified - PPO | Source: Ambulatory Visit | Attending: Radiation Oncology | Admitting: Radiation Oncology

## 2012-02-04 ENCOUNTER — Other Ambulatory Visit: Payer: Self-pay | Admitting: *Deleted

## 2012-02-04 ENCOUNTER — Telehealth: Payer: Self-pay | Admitting: *Deleted

## 2012-02-04 ENCOUNTER — Other Ambulatory Visit: Payer: Federal, State, Local not specified - PPO | Admitting: Lab

## 2012-02-04 ENCOUNTER — Ambulatory Visit: Payer: Federal, State, Local not specified - PPO | Admitting: Oncology

## 2012-02-04 DIAGNOSIS — C50419 Malignant neoplasm of upper-outer quadrant of unspecified female breast: Secondary | ICD-10-CM

## 2012-02-04 NOTE — Telephone Encounter (Signed)
Made patient appointment for bone density at the breast center on 02-16-2012 at 2:00

## 2012-02-07 ENCOUNTER — Ambulatory Visit
Admission: RE | Admit: 2012-02-07 | Discharge: 2012-02-07 | Disposition: A | Discharge status: Home / Self Care | Payer: Medicare Other | Source: Ambulatory Visit | Attending: Radiation Oncology | Admitting: Radiation Oncology

## 2012-02-07 DIAGNOSIS — R5381 Other malaise: Secondary | ICD-10-CM | POA: Insufficient documentation

## 2012-02-07 DIAGNOSIS — L538 Other specified erythematous conditions: Secondary | ICD-10-CM | POA: Insufficient documentation

## 2012-02-07 DIAGNOSIS — Z510 Encounter for antineoplastic radiation therapy: Secondary | ICD-10-CM | POA: Insufficient documentation

## 2012-02-07 DIAGNOSIS — R071 Chest pain on breathing: Secondary | ICD-10-CM | POA: Insufficient documentation

## 2012-02-07 DIAGNOSIS — R21 Rash and other nonspecific skin eruption: Secondary | ICD-10-CM | POA: Insufficient documentation

## 2012-02-07 DIAGNOSIS — C50919 Malignant neoplasm of unspecified site of unspecified female breast: Secondary | ICD-10-CM | POA: Insufficient documentation

## 2012-02-08 ENCOUNTER — Ambulatory Visit
Admission: RE | Admit: 2012-02-08 | Discharge: 2012-02-08 | Disposition: A | Discharge status: Home / Self Care | Payer: Medicare Other | Source: Ambulatory Visit | Attending: Radiation Oncology | Admitting: Radiation Oncology

## 2012-02-08 ENCOUNTER — Encounter: Payer: Self-pay | Admitting: Specialist

## 2012-02-08 ENCOUNTER — Encounter: Payer: Self-pay | Admitting: Radiation Oncology

## 2012-02-08 VITALS — BP 131/80 | HR 71 | Resp 18 | Wt 107.5 lb

## 2012-02-08 DIAGNOSIS — C50419 Malignant neoplasm of upper-outer quadrant of unspecified female breast: Secondary | ICD-10-CM

## 2012-02-08 MED ORDER — BIAFINE EX EMUL
Freq: Once | CUTANEOUS | Status: AC
  Administered 2012-02-08: 12:00:00 via TOPICAL

## 2012-02-08 NOTE — Progress Notes (Signed)
Patient presented to the clinic today unaccompanied for PUT with Dr. Michell Heinrich. Patient alert and oriented to person, place, and time. No distress noted. Steady gait noted. Pleasant affect noted. Patient denies pain at this time. Hyperpigmentation of right chest wall and right upper back noted. Mild dermatitis of right chest wall noted. Transitioned patient from radiaplex and hydrocortisone cream to biafine to relieve itching. Patient denies decline of energy level. All questions answered. Reported all findings to Dr. Michell Heinrich.

## 2012-02-08 NOTE — Progress Notes (Signed)
Weekly Management Note Current Dose: 22 Gy  Projected Dose: 46 Gy   Narrative:  The patient presents for routine under treatment assessment.  CBCT/MVCT images/Port film x-rays were reviewed.  The chart was checked. Skin itching, working with PT.  Physical Findings: Weight: 107 lb 8 oz (48.762 kg). Dry desquamation.   Impression:  The patient is tolerating radiation.  Plan:  Continue treatment as planned. Switch to biafene.

## 2012-02-08 NOTE — Progress Notes (Signed)
At the request of Dr. Karoline Caldwell, I attempted to contact Kirsten Gibson to discuss with her options for support and counseling.  I left a message and will try to contact her again in the near future.

## 2012-02-10 ENCOUNTER — Ambulatory Visit
Admission: RE | Admit: 2012-02-10 | Discharge: 2012-02-10 | Disposition: A | Discharge status: Home / Self Care | Payer: Medicare Other | Source: Ambulatory Visit | Attending: Radiation Oncology | Admitting: Radiation Oncology

## 2012-02-10 DIAGNOSIS — R21 Rash and other nonspecific skin eruption: Secondary | ICD-10-CM | POA: Diagnosis not present

## 2012-02-10 DIAGNOSIS — C50419 Malignant neoplasm of upper-outer quadrant of unspecified female breast: Secondary | ICD-10-CM | POA: Diagnosis not present

## 2012-02-10 DIAGNOSIS — R5383 Other fatigue: Secondary | ICD-10-CM | POA: Diagnosis not present

## 2012-02-10 DIAGNOSIS — R5381 Other malaise: Secondary | ICD-10-CM | POA: Diagnosis not present

## 2012-02-10 DIAGNOSIS — R071 Chest pain on breathing: Secondary | ICD-10-CM | POA: Diagnosis not present

## 2012-02-10 DIAGNOSIS — C50919 Malignant neoplasm of unspecified site of unspecified female breast: Secondary | ICD-10-CM | POA: Diagnosis not present

## 2012-02-10 DIAGNOSIS — Z510 Encounter for antineoplastic radiation therapy: Secondary | ICD-10-CM | POA: Diagnosis not present

## 2012-02-10 DIAGNOSIS — L538 Other specified erythematous conditions: Secondary | ICD-10-CM | POA: Diagnosis not present

## 2012-02-11 ENCOUNTER — Ambulatory Visit
Admission: RE | Admit: 2012-02-11 | Discharge: 2012-02-11 | Disposition: A | Discharge status: Home / Self Care | Payer: Medicare Other | Source: Ambulatory Visit | Attending: Radiation Oncology | Admitting: Radiation Oncology

## 2012-02-11 DIAGNOSIS — L538 Other specified erythematous conditions: Secondary | ICD-10-CM | POA: Diagnosis not present

## 2012-02-11 DIAGNOSIS — C50919 Malignant neoplasm of unspecified site of unspecified female breast: Secondary | ICD-10-CM | POA: Diagnosis not present

## 2012-02-11 DIAGNOSIS — R5383 Other fatigue: Secondary | ICD-10-CM | POA: Diagnosis not present

## 2012-02-11 DIAGNOSIS — R071 Chest pain on breathing: Secondary | ICD-10-CM | POA: Diagnosis not present

## 2012-02-11 DIAGNOSIS — R21 Rash and other nonspecific skin eruption: Secondary | ICD-10-CM | POA: Diagnosis not present

## 2012-02-11 DIAGNOSIS — Z510 Encounter for antineoplastic radiation therapy: Secondary | ICD-10-CM | POA: Diagnosis not present

## 2012-02-12 ENCOUNTER — Encounter: Payer: Self-pay | Admitting: Oncology

## 2012-02-14 ENCOUNTER — Encounter: Payer: Self-pay | Admitting: Specialist

## 2012-02-14 ENCOUNTER — Ambulatory Visit
Admission: RE | Admit: 2012-02-14 | Discharge: 2012-02-14 | Disposition: A | Discharge status: Home / Self Care | Payer: Medicare Other | Source: Ambulatory Visit | Attending: Radiation Oncology | Admitting: Radiation Oncology

## 2012-02-14 ENCOUNTER — Other Ambulatory Visit: Payer: Federal, State, Local not specified - PPO | Admitting: Lab

## 2012-02-14 DIAGNOSIS — R5381 Other malaise: Secondary | ICD-10-CM | POA: Diagnosis not present

## 2012-02-14 DIAGNOSIS — R21 Rash and other nonspecific skin eruption: Secondary | ICD-10-CM | POA: Diagnosis not present

## 2012-02-14 DIAGNOSIS — L538 Other specified erythematous conditions: Secondary | ICD-10-CM | POA: Diagnosis not present

## 2012-02-14 DIAGNOSIS — Z510 Encounter for antineoplastic radiation therapy: Secondary | ICD-10-CM | POA: Diagnosis not present

## 2012-02-14 DIAGNOSIS — R071 Chest pain on breathing: Secondary | ICD-10-CM | POA: Diagnosis not present

## 2012-02-14 DIAGNOSIS — C50919 Malignant neoplasm of unspecified site of unspecified female breast: Secondary | ICD-10-CM | POA: Diagnosis not present

## 2012-02-14 NOTE — Progress Notes (Signed)
I called the patient as a referral from Dr. Karoline Caldwell.  We talked at length over the phone about the patient's coping with her cancer diagnosis and treatment.  She said that she is a very private person and has told no one other than her husband about the cancer.  She did admit to getting very tearful anytime her prognosis comes up.  She agreed to meet with me on January 14 to talk and determine if any of our support services might be helpful to her.

## 2012-02-15 ENCOUNTER — Encounter: Payer: Self-pay | Admitting: Radiation Oncology

## 2012-02-15 ENCOUNTER — Ambulatory Visit
Admission: RE | Admit: 2012-02-15 | Discharge: 2012-02-15 | Disposition: A | Discharge status: Home / Self Care | Payer: Medicare Other | Source: Ambulatory Visit | Attending: Radiation Oncology | Admitting: Radiation Oncology

## 2012-02-15 VITALS — BP 134/83 | HR 63 | Resp 18 | Wt 106.0 lb

## 2012-02-15 DIAGNOSIS — R071 Chest pain on breathing: Secondary | ICD-10-CM | POA: Diagnosis not present

## 2012-02-15 DIAGNOSIS — R21 Rash and other nonspecific skin eruption: Secondary | ICD-10-CM | POA: Diagnosis not present

## 2012-02-15 DIAGNOSIS — Z510 Encounter for antineoplastic radiation therapy: Secondary | ICD-10-CM | POA: Diagnosis not present

## 2012-02-15 DIAGNOSIS — R5381 Other malaise: Secondary | ICD-10-CM | POA: Diagnosis not present

## 2012-02-15 DIAGNOSIS — C50419 Malignant neoplasm of upper-outer quadrant of unspecified female breast: Secondary | ICD-10-CM

## 2012-02-15 DIAGNOSIS — L538 Other specified erythematous conditions: Secondary | ICD-10-CM | POA: Diagnosis not present

## 2012-02-15 DIAGNOSIS — C50919 Malignant neoplasm of unspecified site of unspecified female breast: Secondary | ICD-10-CM | POA: Diagnosis not present

## 2012-02-15 NOTE — Progress Notes (Signed)
Patient presents to the clinic today unaccompanied for PUT with Dr. Michell Heinrich. Patient is alert and oriented to person, place, and time. No distress noted. Steady gait noted. Pleasant affect noted. Patient denies pain at this time. Patient reports that on 02/13/2012 she felt tightness/discomfort in her right chest/treatment field. Patient reports that this has resolved. Hyperpigmentation of right chest wall and right upper back noted. Radiation dermatitis noted to right upper chest wall as well. Patient reports using biafine as directed and confirms this relieves the itching felt from the dermatitis. Patient reports laying on the treatment table today took a lot out of her. Reported all findings to Dr. Michell Heinrich.

## 2012-02-15 NOTE — Progress Notes (Signed)
Weekly Management Note Current Dose:  41.4 Gy  Projected Dose:60.4  Gy   Narrative:  The patient presents for routine under treatment assessment.  CBCT/MVCT images/Port film x-rays were reviewed.  The chart was checked. Doing well. Irritation continues. C/o "swooshing sound" every 3 beats of her heart. ? New vs. Had her breasts before. Wants to use foam breasts instead of silicon due to weight. Soreness of her right chest wall for a few days last week. Better now. Still doing her lymphedema exercises.   Physical Findings: Weight: 106 lb (48.081 kg). Dry desquamation over right chest wall. Medial upper worse than lower.   Impression:  The patient is tolerating radiation.  Plan:  Continue treatment as planned. Continue biafene. Encouraged her to discuss heart issues with PCP/cards.

## 2012-02-16 ENCOUNTER — Ambulatory Visit
Admission: RE | Admit: 2012-02-16 | Discharge: 2012-02-16 | Disposition: A | Discharge status: Home / Self Care | Payer: Medicare Other | Source: Ambulatory Visit | Attending: Radiation Oncology | Admitting: Radiation Oncology

## 2012-02-16 ENCOUNTER — Other Ambulatory Visit: Payer: Federal, State, Local not specified - PPO

## 2012-02-16 DIAGNOSIS — L538 Other specified erythematous conditions: Secondary | ICD-10-CM | POA: Diagnosis not present

## 2012-02-16 DIAGNOSIS — C50919 Malignant neoplasm of unspecified site of unspecified female breast: Secondary | ICD-10-CM | POA: Diagnosis not present

## 2012-02-16 DIAGNOSIS — R071 Chest pain on breathing: Secondary | ICD-10-CM | POA: Diagnosis not present

## 2012-02-16 DIAGNOSIS — R21 Rash and other nonspecific skin eruption: Secondary | ICD-10-CM | POA: Diagnosis not present

## 2012-02-16 DIAGNOSIS — R5381 Other malaise: Secondary | ICD-10-CM | POA: Diagnosis not present

## 2012-02-16 DIAGNOSIS — Z510 Encounter for antineoplastic radiation therapy: Secondary | ICD-10-CM | POA: Diagnosis not present

## 2012-02-17 ENCOUNTER — Ambulatory Visit
Admission: RE | Admit: 2012-02-17 | Discharge: 2012-02-17 | Disposition: A | Discharge status: Home / Self Care | Payer: Medicare Other | Source: Ambulatory Visit | Attending: Radiation Oncology | Admitting: Radiation Oncology

## 2012-02-17 DIAGNOSIS — C50419 Malignant neoplasm of upper-outer quadrant of unspecified female breast: Secondary | ICD-10-CM | POA: Diagnosis not present

## 2012-02-17 DIAGNOSIS — L538 Other specified erythematous conditions: Secondary | ICD-10-CM | POA: Diagnosis not present

## 2012-02-17 DIAGNOSIS — R071 Chest pain on breathing: Secondary | ICD-10-CM | POA: Diagnosis not present

## 2012-02-17 DIAGNOSIS — R21 Rash and other nonspecific skin eruption: Secondary | ICD-10-CM | POA: Diagnosis not present

## 2012-02-17 DIAGNOSIS — R5381 Other malaise: Secondary | ICD-10-CM | POA: Diagnosis not present

## 2012-02-17 DIAGNOSIS — Z510 Encounter for antineoplastic radiation therapy: Secondary | ICD-10-CM | POA: Diagnosis not present

## 2012-02-17 DIAGNOSIS — C50919 Malignant neoplasm of unspecified site of unspecified female breast: Secondary | ICD-10-CM | POA: Diagnosis not present

## 2012-02-18 ENCOUNTER — Ambulatory Visit (HOSPITAL_BASED_OUTPATIENT_CLINIC_OR_DEPARTMENT_OTHER): Payer: Medicare Other | Admitting: Oncology

## 2012-02-18 ENCOUNTER — Ambulatory Visit
Admission: RE | Admit: 2012-02-18 | Discharge: 2012-02-18 | Disposition: A | Discharge status: Home / Self Care | Payer: Medicare Other | Source: Ambulatory Visit | Attending: Radiation Oncology | Admitting: Radiation Oncology

## 2012-02-18 ENCOUNTER — Ambulatory Visit: Payer: Federal, State, Local not specified - PPO | Admitting: Oncology

## 2012-02-18 ENCOUNTER — Ambulatory Visit (HOSPITAL_BASED_OUTPATIENT_CLINIC_OR_DEPARTMENT_OTHER): Payer: Federal, State, Local not specified - PPO

## 2012-02-18 ENCOUNTER — Other Ambulatory Visit: Payer: Federal, State, Local not specified - PPO | Admitting: Lab

## 2012-02-18 ENCOUNTER — Encounter: Payer: Self-pay | Admitting: Radiation Oncology

## 2012-02-18 ENCOUNTER — Telehealth: Payer: Self-pay | Admitting: Oncology

## 2012-02-18 ENCOUNTER — Other Ambulatory Visit (HOSPITAL_BASED_OUTPATIENT_CLINIC_OR_DEPARTMENT_OTHER): Payer: Medicare Other | Admitting: Lab

## 2012-02-18 VITALS — BP 157/82 | HR 68 | Temp 98.3°F | Resp 20 | Ht 60.0 in | Wt 108.9 lb

## 2012-02-18 DIAGNOSIS — Z510 Encounter for antineoplastic radiation therapy: Secondary | ICD-10-CM | POA: Diagnosis not present

## 2012-02-18 DIAGNOSIS — C50912 Malignant neoplasm of unspecified site of left female breast: Secondary | ICD-10-CM

## 2012-02-18 DIAGNOSIS — C50119 Malignant neoplasm of central portion of unspecified female breast: Secondary | ICD-10-CM

## 2012-02-18 DIAGNOSIS — C50419 Malignant neoplasm of upper-outer quadrant of unspecified female breast: Secondary | ICD-10-CM

## 2012-02-18 DIAGNOSIS — Z5112 Encounter for antineoplastic immunotherapy: Secondary | ICD-10-CM | POA: Diagnosis not present

## 2012-02-18 DIAGNOSIS — L538 Other specified erythematous conditions: Secondary | ICD-10-CM | POA: Diagnosis not present

## 2012-02-18 DIAGNOSIS — C773 Secondary and unspecified malignant neoplasm of axilla and upper limb lymph nodes: Secondary | ICD-10-CM

## 2012-02-18 DIAGNOSIS — R071 Chest pain on breathing: Secondary | ICD-10-CM | POA: Diagnosis not present

## 2012-02-18 DIAGNOSIS — R5381 Other malaise: Secondary | ICD-10-CM | POA: Diagnosis not present

## 2012-02-18 DIAGNOSIS — C50919 Malignant neoplasm of unspecified site of unspecified female breast: Secondary | ICD-10-CM

## 2012-02-18 DIAGNOSIS — R21 Rash and other nonspecific skin eruption: Secondary | ICD-10-CM | POA: Diagnosis not present

## 2012-02-18 LAB — CBC WITH DIFFERENTIAL/PLATELET
BASO%: 0.9 % (ref 0.0–2.0)
Basophils Absolute: 0 10*3/uL (ref 0.0–0.1)
EOS%: 2.2 % (ref 0.0–7.0)
HCT: 28.8 % — ABNORMAL LOW (ref 34.8–46.6)
HGB: 9.8 g/dL — ABNORMAL LOW (ref 11.6–15.9)
MCH: 30.9 pg (ref 25.1–34.0)
MCHC: 34 g/dL (ref 31.5–36.0)
MCV: 90.8 fL (ref 79.5–101.0)
MONO%: 10.6 % (ref 0.0–14.0)
NEUT%: 71.5 % (ref 38.4–76.8)
lymph#: 0.7 10*3/uL — ABNORMAL LOW (ref 0.9–3.3)

## 2012-02-18 LAB — COMPREHENSIVE METABOLIC PANEL (CC13)
ALT: 13 U/L (ref 0–55)
AST: 20 U/L (ref 5–34)
Alkaline Phosphatase: 81 U/L (ref 40–150)
BUN: 21 mg/dL (ref 7.0–26.0)
Chloride: 101 mEq/L (ref 98–107)
Creatinine: 0.8 mg/dL (ref 0.6–1.1)
Total Bilirubin: 0.22 mg/dL (ref 0.20–1.20)

## 2012-02-18 LAB — CANCER ANTIGEN 27.29: CA 27.29: 21 U/mL (ref 0–39)

## 2012-02-18 MED ORDER — SODIUM CHLORIDE 0.9 % IJ SOLN
10.0000 mL | INTRAMUSCULAR | Status: DC | PRN
  Administered 2012-02-18: 10 mL
  Filled 2012-02-18: qty 10

## 2012-02-18 MED ORDER — SODIUM CHLORIDE 0.9 % IV SOLN
Freq: Once | INTRAVENOUS | Status: AC
  Administered 2012-02-18: 12:00:00 via INTRAVENOUS

## 2012-02-18 MED ORDER — HEPARIN SOD (PORK) LOCK FLUSH 100 UNIT/ML IV SOLN
500.0000 [IU] | Freq: Once | INTRAVENOUS | Status: AC | PRN
  Administered 2012-02-18: 500 [IU]
  Filled 2012-02-18: qty 5

## 2012-02-18 MED ORDER — TRASTUZUMAB CHEMO INJECTION 440 MG
6.0000 mg/kg | Freq: Once | INTRAVENOUS | Status: AC
  Administered 2012-02-18: 294 mg via INTRAVENOUS
  Filled 2012-02-18: qty 14

## 2012-02-18 MED ORDER — ACETAMINOPHEN 325 MG PO TABS
650.0000 mg | ORAL_TABLET | Freq: Once | ORAL | Status: AC
  Administered 2012-02-18: 650 mg via ORAL

## 2012-02-18 MED ORDER — DIPHENHYDRAMINE HCL 25 MG PO CAPS
25.0000 mg | ORAL_CAPSULE | Freq: Once | ORAL | Status: AC
  Administered 2012-02-18: 25 mg via ORAL

## 2012-02-18 NOTE — Progress Notes (Signed)
Name: Kirsten Gibson   MRN: [id number]  Date:  02/18/2012   DOB: August 18, 1945  Status:outpatient    DIAGNOSIS: Breast cancer.  CONSENT VERIFIED: yes   SET UP: Patient is setup supine   IMMOBILIZATION:  The following immobilization was used:Custom Moldable Pillow, breast board.   NARRATIVE: Kirsten Gibson underwent complex simulation and treatment planning for her boost treatment today.  Due to her anatomy, 2 fields were needed and constructed for her scar boost. I makred these fields out on the machine and chose the angles on the machine.   For both fields (medial and lateral) 6  MeV electrons will be prescribed to the 95% Isodose line. A 0.5 cm bolus will be used everyday for treatment.   2 blocks will be used for beam modification purposes.  A special port plan is requested.

## 2012-02-18 NOTE — Patient Instructions (Addendum)
[facility] Discharge Instructions for Patients Receiving Chemotherapy  Today you received the following chemotherapy agents: herceptin  To help prevent nausea and vomiting after your treatment, we encourage you to take your nausea medication.  Take it as often as prescribed.     If you develop nausea and vomiting that is not controlled by your nausea medication, call the clinic. If it is after clinic hours your family physician or the after hours number for the clinic or go to the Emergency Department.   BELOW ARE SYMPTOMS THAT SHOULD BE REPORTED IMMEDIATELY:  *FEVER GREATER THAN 100.5 F  *CHILLS WITH OR WITHOUT FEVER  NAUSEA AND VOMITING THAT IS NOT CONTROLLED WITH YOUR NAUSEA MEDICATION  *UNUSUAL SHORTNESS OF BREATH  *UNUSUAL BRUISING OR BLEEDING  TENDERNESS IN MOUTH AND THROAT WITH OR WITHOUT PRESENCE OF ULCERS  *URINARY PROBLEMS  *BOWEL PROBLEMS  UNUSUAL RASH Items with * indicate a potential emergency and should be followed up as soon as possible.  Feel free to call the clinic you have any questions or concerns. The clinic phone number is [phone].   I have been informed and understand all the instructions given to me. I know to contact the clinic, my physician, or go to the Emergency Department if any problems should occur. I do not have any questions at this time, but understand that I may call the clinic during office hours   should I have any questions or need assistance in obtaining follow up care.    __________________________________________  _____________  __________ Signature of Patient or Authorized Representative            Date                   Time    __________________________________________ Nurse's Signature    

## 2012-02-18 NOTE — Telephone Encounter (Signed)
appts made and printed for pt aom °

## 2012-02-19 NOTE — Progress Notes (Signed)
ID: Kirsten Gibson   DOB: August 21, 1945  MR#: [id number]  WGN#:[id number]  PCP: Emeterio Reeve, MD GYN:  SU: Cicero Duck, Malvin Johns Lenis Noon OTHER HQ:[facility] Baton   HISTORY OF PRESENT ILLNESS: She palpated a right breast mass March 2013 and presented for a mammogram. This confirmed the presence of a 1.9 cm mass in the upper outer quadrants. And adjacent mass was noted which was possibly thought to represent a intramammary lymph node. A right axillary lymph node was also noted to be enlarged. Biopsy of the upper outer quadrant mass showed a grade 2 invasive ductal carcinoma which was ER/PR positive HER-2 positive. Ki 67 was 12%. An MRI of the breast was performed 05/10/2011 which showed an overall area of the right breast measuring 5.0 x 2.6 x 2.2 cm of clumped linear enhancement. Majority of this was felt to represent DCIS. A small area of clumped enhancement was noted in the left breast which was not seen on the mammogram. A biopsy of this area was recommended. A biopsy of the right axillary lymph node was also positive for invasive ductal carcinoma. The patient's subsequent history is as detailed below  INTERVAL HISTORY: Kirsten Gibson returns today with her husband Kirsten Gibson for followup of her breast cancer. She is establishing herself on my service today.  REVIEW OF SYSTEMS: She tells me she did well with the chemotherapy and surgery and is tolerating the radiation well. She denies significant peripheral neuropathy symptoms although she still has some nail changes from her chemotherapy. She denies unusual headaches, visual changes, dizziness, gait imbalance, focal weakness, nausea, or vomiting. She has good energy, does all her normal activities, and a detailed review of systems today was otherwise noncontributory.  PAST MEDICAL HISTORY: Past Medical History  Diagnosis Date  . Loose stools   . Frequency   . Anxiety   . TMJ click   . Breast cancer     bilateral infiltrating  ductal carcinoma  . S/P chemotherapy, time since 4-12 weeks   . Rosacea   . Mycosis fungoides     follows up with [facility] once to twice per year   . Anemia due to chemotherapy 08/23/2011    PAST SURGICAL HISTORY: Past Surgical History  Procedure Date  . Cholecystectomy   . Benign tumor removed from neck 1974  . Rt breast biobsy   . Breast biopsy 05/19/11    left  and right breast  . Portacath placement 05/21/2011    Procedure: INSERTION PORT-A-CATH;  Surgeon: Currie Paris, MD;  Location: [facility];  Service: General;  Laterality: Left;  . Abdominal hysterectomy     partial with on ovary remaining.  [name] Biopsy breast     right breast biopsy     FAMILY HISTORY Family History  Problem Relation Age of Onset  . Cancer Maternal Aunt     breast to brain  . Cancer Paternal Grandmother     breast and/or ovarian///unknown  . Cancer Paternal Grandfather     stomach   the patient's father is still living, currently 45 years old. The patient's mother died at the age of 45 from complications of diabetes. The patient had no brothers, 3 sisters. One of the patient's mother is 2 sisters was diagnosed with breast cancer in her late 62s. There is no other history of breast or ovarian cancer in the family. The patient tells me she was evaluated for genetic testing at [facility], but that the insurance would not approve of the  test.  GYNECOLOGIC HISTORY: Menarche age 69, she is GX P0. She underwent hysterectomy and unilateral salpingo-oophorectomy in 1988. She took hormone replacement for approximately 3 years.  SOCIAL HISTORY: She used to work at [employer], but is now retired. Her husband of 21 years, Kirsten Gibson, used to work for the [employer] He has a son from a prior marriage, and 2 grandchildren. The patient attends [address]   ADVANCED DIRECTIVES: Not in place  HEALTH MAINTENANCE: History  Substance Use Topics  . Smoking  status: Former Smoker -- 1.0 packs/day for 20 years    Types: Cigarettes    Quit date: 02/08/1986  . Smokeless tobacco: Never Used  . Alcohol Use: 0.0 oz/week    0-1 Glasses of wine per week     Comment: 2 glasses wine per month     Colonoscopy:  PAP:  Bone density:  Lipid panel:  Allergies  Allergen Reactions  . Latex Dermatitis and Rash  . Adhesive (Tape)     ALLERGIC TO OP-SITE......USE OP-SITE FLEXIGRID INSTEAD !!!  . Diclofenac Other (See Comments)    Fever, chills  . Naproxen Other (See Comments)    Fever, chills    Current Outpatient Prescriptions  Medication Sig Dispense Refill  . ALPRAZolam (XANAX) 0.5 MG tablet 2 tablets at night as needed for sleep  60 tablet  2  . calcium carbonate (OS-CAL) 600 MG TABS Take 600 mg by mouth 3 (three) times daily with meals.      [name] emollient (BIAFINE) cream Apply topically as needed.      . iron polysaccharides (NIFEREX) 150 MG capsule Take 1 capsule (150 mg total) by mouth 2 (two) times daily.  60 capsule  1  . lidocaine-prilocaine (EMLA) cream APPLY TO PORT A CATH AS DIRECTED BEFORE CHEMO TREATMENT.  30 g  3  . non-metallic deodorant (ALRA) MISC Apply 1 application topically daily as needed.      . prochlorperazine (COMPAZINE) 10 MG tablet Take 1 tablet (10 mg total) by mouth every 6 (six) hours as needed (Nausea or vomiting).  30 tablet  1  . Wound Cleansers (RADIAPLEX EX) Apply topically.      . Cholecalciferol (VITAMIN D3 PO) Take 3,000 mg by mouth daily.       . ondansetron (ZOFRAN) 8 MG tablet Take 1 tablet two times a day starting the day after chemo for 3 days. Then take 1 tab two times a day as needed for nausea or vomiting.  30 tablet  1  . prochlorperazine (COMPAZINE) 25 MG suppository Place 1 suppository (25 mg total) rectally every 12 (twelve) hours as needed for nausea.  12 suppository  3    OBJECTIVE: Middle-aged white woman who appears anxious Filed Vitals:   02/18/12 1013  BP: 157/82  Pulse: 68  Temp: 98.3 F  (36.8 C)  Resp: 20     Body mass index is 21.27 kg/(m^2).    ECOG FS: 0  Sclerae unicteric Oropharynx clear No cervical or supraclavicular adenopathy Lungs no rales or rhonchi Heart regular rate and rhythm Abd benign MSK no focal spinal tenderness, no peripheral edema Neuro: nonfocal Breasts: Status post bilateral mastectomies. The incisions have healed very nicely. There is a very minimal excess flap at the lateralmost edge of both scars. Both axillae are benign   LAB RESULTS: Lab Results  Component Value Date   WBC 5.0 02/18/2012   NEUTROABS 3.6 02/18/2012   HGB 9.8* 02/18/2012   HCT 28.8* 02/18/2012  MCV 90.8 02/18/2012   PLT 167 02/18/2012      Chemistry      Component Value Date/Time   NA 139 02/18/2012 0959   NA 139 01/28/2012 1636   K 3.7 02/18/2012 0959   K 3.5 01/28/2012 1636   CL 101 02/18/2012 0959   CL 102 01/28/2012 1636   CO2 29 02/18/2012 0959   CO2 27 01/28/2012 1636   BUN 21.0 02/18/2012 0959   BUN 29* 01/28/2012 1636   CREATININE 0.8 02/18/2012 0959   CREATININE 1.01 01/28/2012 1636      Component Value Date/Time   CALCIUM 9.2 02/18/2012 0959   CALCIUM 9.2 01/28/2012 1636   ALKPHOS 81 02/18/2012 0959   ALKPHOS 84 01/28/2012 1636   AST 20 02/18/2012 0959   AST 23 01/28/2012 1636   ALT 13 02/18/2012 0959   ALT 20 01/28/2012 1636   BILITOT 0.22 02/18/2012 0959   BILITOT 0.2* 01/28/2012 1636       Lab Results  Component Value Date   LABCA2 21 02/18/2012    No components found with this basename: LABCA125    No results found for this basename: INR:1;PROTIME:1 in the last 168 hours  Urinalysis No results found for this basename: colorurine, appearanceur, labspec, phurine, glucoseu, hgbur, bilirubinur, ketonesur, proteinur, urobilinogen, nitrite, leukocytesur    STUDIES: No results found.  ASSESSMENT: 67 y.o. [place] woman  (1) status post right breast and axillary lymph node biopsy 05/04/2011, both positive for a grade 2 invasive ductal  carcinoma, cT2 pN1 or stage IIB,estrogen receptor 82% and progesterone receptor 92% positive, with an MIB-1 of 12%, and HER-2 amplification by CISH with a ratio of 2.86  (2) biopsy 05/19/2011 of a second right breast area showed low-grade invasive ductal carcinoma, estrogen and progesterone receptor both 100% positive, with an MIB-1 of 13%, and no HER-2 amplification  (3) left breast biopsy 05/19/2011 showed a low-grade invasive ductal carcinoma, e-cadherin positive, HER-2 negative  (4) neoadjuvant treatment consisted of carboplatin/ docetaxel/ trastuzumab x6 completed 09/29/2011  (5) status post bilateral mastectomies at [facility] October 2013 showing  (a) on the right, a residual 1.2 cm invasive ductal carcinoma in the breast, with 3 of 19 lymph nodes involved; an additional lymph node showed only isolated tumor cells, so this is a ypT1c ypN1 result  (b) on the left, there was a 1 mm area of residual invasive ductal carcinoma, with all 5 sentinel lymph nodes negative  (6) the patient will complete postmastectomy radiation on the right 02/28/2011  (7) status post-op pertuzumab x3 completed 01/28/2012  (8) trastuzumab resumed 02/18/2012, to be completed April of 2014. Gibson recent echocardiogram 12/16/2011 showed a well preserved ejection fraction.  PLAN: I spent well over an hour with Kirsten Gibson and Kirsten Gibson today going over her story and reviewing her pathology results and treatment plan. We discussed switching from pertuzumab to trastuzumab today and that was operationalized. She was very interested in her prognosis, and at the time of her initial presentation the Adjuvant! Program would have quoted her a risk of recurrence in the 55% range with local treatment only. With chemotherapy an anti-estrogens this would drop by 31%, leaving her with a 24% residual risk. Since her Gibson aggressive tumor however was HER-2 positive, and she is receiving anti-HER-2 treatment, this should cut the risk of  recurrence again in half, leaving her with a 12% risk of breast cancer recurrence at 10 years. This means 88 women out of 100 like her should be cancer free 10 years  after their surgery.  We then initiated a discussion of antiestrogen therapy. I think she would be a particularly good candidate for tamoxifen, since she is status post hysterectomy and she took hormone replacement for several years with no clotting complications. Tamoxifen would also allow her to use vaginal estrogen preparations to treat vaginal dryness issues. It would also strengthen her bones, and since she is a slender white woman, she is at risk of osteoporosis.  For all these reasons I think she would do well on tamoxifen. However I would prefer that she complete her radiation and somewhat recover from those treatments before starting her on anti-estrogens. She will see me again January 31, with her next trastuzumab treatment, and we will likely start her on tamoxifen at that time. I did give her written information on the possible toxicities, side effects and complications of this medication today. She knows to call for any other problems that may develop before the next visit.  Rayland Hamed C    02/19/2012

## 2012-02-21 ENCOUNTER — Other Ambulatory Visit: Payer: Self-pay | Admitting: *Deleted

## 2012-02-21 ENCOUNTER — Telehealth: Payer: Self-pay | Admitting: *Deleted

## 2012-02-21 ENCOUNTER — Ambulatory Visit
Admission: RE | Admit: 2012-02-21 | Discharge: 2012-02-21 | Disposition: A | Discharge status: Home / Self Care | Payer: Medicare Other | Source: Ambulatory Visit | Attending: Radiation Oncology | Admitting: Radiation Oncology

## 2012-02-21 DIAGNOSIS — R071 Chest pain on breathing: Secondary | ICD-10-CM | POA: Diagnosis not present

## 2012-02-21 DIAGNOSIS — R21 Rash and other nonspecific skin eruption: Secondary | ICD-10-CM | POA: Diagnosis not present

## 2012-02-21 DIAGNOSIS — L538 Other specified erythematous conditions: Secondary | ICD-10-CM | POA: Diagnosis not present

## 2012-02-21 DIAGNOSIS — R5381 Other malaise: Secondary | ICD-10-CM | POA: Diagnosis not present

## 2012-02-21 DIAGNOSIS — C50919 Malignant neoplasm of unspecified site of unspecified female breast: Secondary | ICD-10-CM | POA: Diagnosis not present

## 2012-02-21 DIAGNOSIS — Z510 Encounter for antineoplastic radiation therapy: Secondary | ICD-10-CM | POA: Diagnosis not present

## 2012-02-21 NOTE — Telephone Encounter (Signed)
Sent michelle email to set up treatment on 03-10-2012

## 2012-02-21 NOTE — Telephone Encounter (Signed)
Per staff message and POF I have scheduled appts.  JMW  

## 2012-02-22 ENCOUNTER — Encounter: Payer: Self-pay | Admitting: Radiation Oncology

## 2012-02-22 ENCOUNTER — Encounter: Payer: Self-pay | Admitting: Specialist

## 2012-02-22 ENCOUNTER — Ambulatory Visit
Admission: RE | Admit: 2012-02-22 | Discharge: 2012-02-22 | Disposition: A | Discharge status: Home / Self Care | Payer: Medicare Other | Source: Ambulatory Visit | Attending: Radiation Oncology | Admitting: Radiation Oncology

## 2012-02-22 VITALS — BP 121/68 | HR 75 | Temp 97.7°F | Resp 20 | Wt 107.2 lb

## 2012-02-22 DIAGNOSIS — R5381 Other malaise: Secondary | ICD-10-CM | POA: Diagnosis not present

## 2012-02-22 DIAGNOSIS — L538 Other specified erythematous conditions: Secondary | ICD-10-CM | POA: Diagnosis not present

## 2012-02-22 DIAGNOSIS — Z510 Encounter for antineoplastic radiation therapy: Secondary | ICD-10-CM | POA: Diagnosis not present

## 2012-02-22 DIAGNOSIS — C50919 Malignant neoplasm of unspecified site of unspecified female breast: Secondary | ICD-10-CM | POA: Diagnosis not present

## 2012-02-22 DIAGNOSIS — R21 Rash and other nonspecific skin eruption: Secondary | ICD-10-CM | POA: Diagnosis not present

## 2012-02-22 DIAGNOSIS — R071 Chest pain on breathing: Secondary | ICD-10-CM | POA: Diagnosis not present

## 2012-02-22 DIAGNOSIS — C50419 Malignant neoplasm of upper-outer quadrant of unspecified female breast: Secondary | ICD-10-CM

## 2012-02-22 MED ORDER — BIAFINE EX EMUL
Freq: Two times a day (BID) | CUTANEOUS | Status: DC
  Administered 2012-02-22: 12:00:00 via TOPICAL

## 2012-02-22 NOTE — Progress Notes (Signed)
Weekly Management Note Current Dose: 50.4  Gy  Projected Dose: 50.4 Gy   Narrative:  The patient presents for routine under treatment assessment.  CBCT/MVCT images/Port film x-rays were reviewed.  The chart was checked. Doing well. Worried about her skin. Met with chaplain but still unwilling to talk about her dx with anyone but her husband. Was encouraged by the numbers she discussed with Dr. Darnelle Catalan.   Physical Findings: Weight: 107 lb 3.2 oz (48.626 kg). Unchanged. Dry desqumation over right chest wall. Drain site is red but not moist. Back is healing well.   Impression:  The patient is tolerating radiation.  Plan:  Continue treatment as planned. Need to see her Friday or Tuesday for end of treatment.

## 2012-02-22 NOTE — Progress Notes (Addendum)
Pt denies pain, loss of appetite, fatigue. She is applying Biafine to right chest wall for hyperpigmentation, slight dry desquamation. Gave pt another tube. Pt has not been tx today. She will complete tx 02/28/12 and requests to see Dr Basilio Cairo if she needs to see dr on her final tx day. Pt will discuss w/Dr Michell Heinrich today.

## 2012-02-22 NOTE — Progress Notes (Signed)
I spent about an hour today in my office with Kirsten Gibson, referred to me by Dr. Basilio Cairo.  Kirsten Gibson explained that she has told no one other than Kirsten Gibson husband about Kirsten Gibson cancer diagnosis.  She prefers to deal with Kirsten Gibson feelings and emotions alone and does not want to be identified as a "cancer patient".  Throughout our meeting, Kirsten Gibson was periodically tearful.  When I asked about the tears, she said that she is an empathetic person and is often moved to tears by people's stories, including Kirsten Gibson own.  She also talked about a recent awareness that life is not fair, proven by the fact that she has been diagnosed twice with cancer.  We talked about how that realization affects one's theology and spiritual life and overall view and understanding of life.  I encouraged Kirsten Gibson to pay attention to Kirsten Gibson tears and their timing and to make a list of things she knows to be true as a result of Kirsten Gibson cancer journey.  She was not willing to pursue counseling or support group at this time.  She did express that Kirsten Gibson faith has become stronger and is a source of strength for Kirsten Gibson at this time.  I invited Kirsten Gibson to contact me if she needs to talk about Kirsten Gibson cancer experience.  At that time, I would again encourage Kirsten Gibson to take advantage of what counseling might offer Kirsten Gibson.

## 2012-02-23 ENCOUNTER — Ambulatory Visit
Admission: RE | Admit: 2012-02-23 | Discharge: 2012-02-23 | Disposition: A | Discharge status: Home / Self Care | Payer: Medicare Other | Source: Ambulatory Visit | Attending: Oncology | Admitting: Oncology

## 2012-02-23 ENCOUNTER — Ambulatory Visit
Admission: RE | Admit: 2012-02-23 | Discharge: 2012-02-23 | Disposition: A | Discharge status: Home / Self Care | Payer: Medicare Other | Source: Ambulatory Visit | Attending: Radiation Oncology | Admitting: Radiation Oncology

## 2012-02-23 DIAGNOSIS — C50919 Malignant neoplasm of unspecified site of unspecified female breast: Secondary | ICD-10-CM | POA: Diagnosis not present

## 2012-02-23 DIAGNOSIS — C50419 Malignant neoplasm of upper-outer quadrant of unspecified female breast: Secondary | ICD-10-CM

## 2012-02-23 DIAGNOSIS — Z510 Encounter for antineoplastic radiation therapy: Secondary | ICD-10-CM | POA: Diagnosis not present

## 2012-02-23 DIAGNOSIS — R071 Chest pain on breathing: Secondary | ICD-10-CM | POA: Diagnosis not present

## 2012-02-23 DIAGNOSIS — L538 Other specified erythematous conditions: Secondary | ICD-10-CM | POA: Diagnosis not present

## 2012-02-23 DIAGNOSIS — M949 Disorder of cartilage, unspecified: Secondary | ICD-10-CM | POA: Diagnosis not present

## 2012-02-23 DIAGNOSIS — R21 Rash and other nonspecific skin eruption: Secondary | ICD-10-CM | POA: Diagnosis not present

## 2012-02-23 DIAGNOSIS — R5381 Other malaise: Secondary | ICD-10-CM | POA: Diagnosis not present

## 2012-02-24 ENCOUNTER — Other Ambulatory Visit: Payer: Self-pay | Admitting: *Deleted

## 2012-02-24 ENCOUNTER — Ambulatory Visit
Admission: RE | Admit: 2012-02-24 | Discharge: 2012-02-24 | Disposition: A | Discharge status: Home / Self Care | Payer: Medicare Other | Source: Ambulatory Visit | Attending: Radiation Oncology | Admitting: Radiation Oncology

## 2012-02-24 DIAGNOSIS — C50919 Malignant neoplasm of unspecified site of unspecified female breast: Secondary | ICD-10-CM | POA: Diagnosis not present

## 2012-02-24 DIAGNOSIS — R071 Chest pain on breathing: Secondary | ICD-10-CM | POA: Diagnosis not present

## 2012-02-24 DIAGNOSIS — L538 Other specified erythematous conditions: Secondary | ICD-10-CM | POA: Diagnosis not present

## 2012-02-24 DIAGNOSIS — R21 Rash and other nonspecific skin eruption: Secondary | ICD-10-CM | POA: Diagnosis not present

## 2012-02-24 DIAGNOSIS — R5381 Other malaise: Secondary | ICD-10-CM | POA: Diagnosis not present

## 2012-02-24 DIAGNOSIS — C50419 Malignant neoplasm of upper-outer quadrant of unspecified female breast: Secondary | ICD-10-CM | POA: Diagnosis not present

## 2012-02-24 DIAGNOSIS — Z510 Encounter for antineoplastic radiation therapy: Secondary | ICD-10-CM | POA: Diagnosis not present

## 2012-02-24 NOTE — Progress Notes (Signed)
Message left by pt stating she is following up per discussion with Dr Thea Silversmith about an appt with Dr Gala Romney - " was he going to refer me or am I suppose to call for an appointment ".  This RN reviewed pt's visit documentation and above not noted as discussed.  This RN will follow up with MD per above and return call to pt.

## 2012-02-25 ENCOUNTER — Ambulatory Visit
Admission: RE | Admit: 2012-02-25 | Discharge: 2012-02-25 | Disposition: A | Discharge status: Home / Self Care | Payer: Medicare Other | Source: Ambulatory Visit | Attending: Radiation Oncology | Admitting: Radiation Oncology

## 2012-02-25 ENCOUNTER — Encounter: Payer: Self-pay | Admitting: Radiation Oncology

## 2012-02-25 VITALS — BP 115/80 | HR 80 | Temp 98.4°F | Resp 20 | Wt 106.2 lb

## 2012-02-25 DIAGNOSIS — Z510 Encounter for antineoplastic radiation therapy: Secondary | ICD-10-CM | POA: Diagnosis not present

## 2012-02-25 DIAGNOSIS — R5381 Other malaise: Secondary | ICD-10-CM | POA: Diagnosis not present

## 2012-02-25 DIAGNOSIS — R071 Chest pain on breathing: Secondary | ICD-10-CM | POA: Diagnosis not present

## 2012-02-25 DIAGNOSIS — L538 Other specified erythematous conditions: Secondary | ICD-10-CM | POA: Diagnosis not present

## 2012-02-25 DIAGNOSIS — C50919 Malignant neoplasm of unspecified site of unspecified female breast: Secondary | ICD-10-CM | POA: Diagnosis not present

## 2012-02-25 DIAGNOSIS — R21 Rash and other nonspecific skin eruption: Secondary | ICD-10-CM | POA: Diagnosis not present

## 2012-02-25 DIAGNOSIS — C50912 Malignant neoplasm of unspecified site of left female breast: Secondary | ICD-10-CM

## 2012-02-25 NOTE — Progress Notes (Signed)
Pt denies pain, fatigue, loss of appetite. She continues to apply Biafine cream to right chest wall tx area. She will complete tx 02/28/12. She has 1 month FU and [facility] information.

## 2012-02-25 NOTE — Progress Notes (Signed)
Weekly Management Note Current Dose: 58.4  Gy  Projected Dose: 60.4 Gy   Narrative:  The patient presents for routine under treatment assessment.  CBCT/MVCT images/Port film x-rays were reviewed.  The chart was checked. No complaints. Excited to be done.   Physical Findings: Weight: 106 lb 3.2 oz (48.172 kg). Skin is darkening on chest and back. Some very early dry desquamation in the axilla.   Impression:  The patient is tolerating radiation.  Plan:  Continue treatment as planned. F/u in 1 month or sooner prn. Use biafene x 2 weeks then lotion with vit e.

## 2012-02-28 ENCOUNTER — Encounter: Payer: Self-pay | Admitting: Radiation Oncology

## 2012-02-28 ENCOUNTER — Ambulatory Visit
Admission: RE | Admit: 2012-02-28 | Discharge: 2012-02-28 | Disposition: A | Discharge status: Home / Self Care | Payer: Medicare Other | Source: Ambulatory Visit | Attending: Radiation Oncology | Admitting: Radiation Oncology

## 2012-02-28 DIAGNOSIS — C50919 Malignant neoplasm of unspecified site of unspecified female breast: Secondary | ICD-10-CM | POA: Diagnosis not present

## 2012-02-28 DIAGNOSIS — R21 Rash and other nonspecific skin eruption: Secondary | ICD-10-CM | POA: Diagnosis not present

## 2012-02-28 DIAGNOSIS — Z510 Encounter for antineoplastic radiation therapy: Secondary | ICD-10-CM | POA: Diagnosis not present

## 2012-02-28 DIAGNOSIS — R5383 Other fatigue: Secondary | ICD-10-CM | POA: Diagnosis not present

## 2012-02-28 DIAGNOSIS — L538 Other specified erythematous conditions: Secondary | ICD-10-CM | POA: Diagnosis not present

## 2012-02-28 DIAGNOSIS — R071 Chest pain on breathing: Secondary | ICD-10-CM | POA: Diagnosis not present

## 2012-03-02 NOTE — Progress Notes (Signed)
  Radiation Oncology         [phone] ________________________________  Name: Kirsten Gibson MRN: [id number]  Date: 02/28/2012  DOB: Jul 18, 1945  End of Treatment Note  Diagnosis:   Bilateral breast cancer (Right - ypT1c ypN2 M0, Left - ypT68mic ypN0 M0)    Indication for treatment:  Curative       Radiation treatment dates:   01/11/2012-02/28/2012  Site/dose:    Right chestwall / 50.4 Gray @ 1.8 Wallace Cullens per fraction x 28 fractions Right supraclavicular fossa / 45 Gy @1 .8 Gy per fraction x 25 fractions Right drain site/ 46 Gy at 2 Gy per fraction x 23 fractions / 6 MeV electrons Right scar boost / 10 Gray at [employer] per fraction x 8 fractions  Beams/energy:  Opposed Tangents / 6 MV photons LAO / 6 MV photons Enface / 6 MeV electrons Enface / 6 MeV electrons  Narrative: The patient tolerated radiation treatment relatively well.   She had a rash over her abdomen which is likely unrelated to radiation. She had relatively little in terms of dermatitis and took great care of her skin. She was followed by our Child psychotherapist department for counseling and support.  Plan: The patient has completed radiation treatment. The patient will return to radiation oncology clinic for routine followup in one month. I advised them to call or return sooner if they have any questions or concerns related to their recovery or treatment. I have encouraged her to enroll in our finding her new normal survivorship program. She is not interested in reconstruction at this time.  ------------------------------------------------  Lurline Hare, MD

## 2012-03-02 NOTE — Progress Notes (Signed)
  Radiation Oncology         [phone] ________________________________  Name: Kirsten Gibson MRN: [id number]  Date: 01/10/2012  DOB: 05-Mar-1945  Simulation Verification Note  Status: outpatient  NARRATIVE: The patient was brought to the treatment unit and placed in the planned treatment position. The clinical setup was verified. Then port films were obtained and uploaded to the radiation oncology medical record software.  The treatment beams were carefully compared against the planned radiation fields. The position location and shape of the radiation fields was reviewed. The targeted volume of tissue appears appropriately covered by the radiation beams. Organs at risk appear to be excluded as planned.  Based on my personal review, I approved the simulation verification. The patient's treatment will proceed as planned.  ------------------------------------------------  Lurline Hare, MD

## 2012-03-02 NOTE — Progress Notes (Addendum)
Name: Kirsten Gibson   MRN: [id number]  Date:  01/21/2012    DOB: Oct 08, 1945  Status:outpatient    DIAGNOSIS: Breast cancer.  CONSENT VERIFIED: yes   SET UP: Patient is setup supine   IMMOBILIZATION:  The following immobilization was used:Custom Moldable Pillow, breast board.   NARRATIVE: [name] underwent complex simulation and treatment planning for her drain site treatment today. I outlined her drain site cavity on the treatment machine. This will all but her tangent fields. Moving the tangent fields to a deeper position would include too much lung tissue. For that reason I have chosen electron beams.  6  MeV electrons will be prescribed to the 90% Isodose line. 0.5 cm bolus will be placed daily.   A block will be used for beam modification purposes.  A special port plan is requested.

## 2012-03-10 ENCOUNTER — Ambulatory Visit (HOSPITAL_BASED_OUTPATIENT_CLINIC_OR_DEPARTMENT_OTHER): Payer: Medicare Other | Admitting: Oncology

## 2012-03-10 ENCOUNTER — Ambulatory Visit (HOSPITAL_BASED_OUTPATIENT_CLINIC_OR_DEPARTMENT_OTHER): Payer: Medicare Other

## 2012-03-10 ENCOUNTER — Telehealth: Payer: Self-pay | Admitting: Oncology

## 2012-03-10 ENCOUNTER — Other Ambulatory Visit (HOSPITAL_BASED_OUTPATIENT_CLINIC_OR_DEPARTMENT_OTHER): Payer: Medicare Other | Admitting: Lab

## 2012-03-10 VITALS — BP 126/65 | HR 71 | Temp 98.4°F | Resp 16 | Wt 105.6 lb

## 2012-03-10 DIAGNOSIS — C50919 Malignant neoplasm of unspecified site of unspecified female breast: Secondary | ICD-10-CM

## 2012-03-10 DIAGNOSIS — C50419 Malignant neoplasm of upper-outer quadrant of unspecified female breast: Secondary | ICD-10-CM

## 2012-03-10 DIAGNOSIS — C50119 Malignant neoplasm of central portion of unspecified female breast: Secondary | ICD-10-CM | POA: Diagnosis not present

## 2012-03-10 DIAGNOSIS — C50912 Malignant neoplasm of unspecified site of left female breast: Secondary | ICD-10-CM

## 2012-03-10 DIAGNOSIS — M899 Disorder of bone, unspecified: Secondary | ICD-10-CM

## 2012-03-10 DIAGNOSIS — C773 Secondary and unspecified malignant neoplasm of axilla and upper limb lymph nodes: Secondary | ICD-10-CM

## 2012-03-10 DIAGNOSIS — Z5112 Encounter for antineoplastic immunotherapy: Secondary | ICD-10-CM | POA: Diagnosis not present

## 2012-03-10 LAB — CBC WITH DIFFERENTIAL/PLATELET
BASO%: 0.4 % (ref 0.0–2.0)
Basophils Absolute: 0 10*3/uL (ref 0.0–0.1)
EOS%: 1.6 % (ref 0.0–7.0)
HCT: 30.4 % — ABNORMAL LOW (ref 34.8–46.6)
HGB: 10 g/dL — ABNORMAL LOW (ref 11.6–15.9)
LYMPH%: 17.9 % (ref 14.0–49.7)
MCH: 29.6 pg (ref 25.1–34.0)
MCHC: 32.9 g/dL (ref 31.5–36.0)
MCV: 89.9 fL (ref 79.5–101.0)
MONO%: 10.1 % (ref 0.0–14.0)
NEUT%: 70 % (ref 38.4–76.8)
Platelets: 170 10*3/uL (ref 145–400)
lymph#: 0.8 10*3/uL — ABNORMAL LOW (ref 0.9–3.3)

## 2012-03-10 MED ORDER — SODIUM CHLORIDE 0.9 % IV SOLN
6.0000 mg/kg | Freq: Once | INTRAVENOUS | Status: DC
  Filled 2012-03-10: qty 14

## 2012-03-10 MED ORDER — TAMOXIFEN CITRATE 20 MG PO TABS: 20.0000 mg | ORAL_TABLET | Freq: Every day | ORAL | Status: DC

## 2012-03-10 MED ORDER — HEPARIN SOD (PORK) LOCK FLUSH 100 UNIT/ML IV SOLN
500.0000 [IU] | Freq: Once | INTRAVENOUS | Status: AC | PRN
  Administered 2012-03-10: 500 [IU]
  Filled 2012-03-10: qty 5

## 2012-03-10 MED ORDER — DIPHENHYDRAMINE HCL 25 MG PO CAPS
25.0000 mg | ORAL_CAPSULE | Freq: Once | ORAL | Status: AC
  Administered 2012-03-10: 25 mg via ORAL

## 2012-03-10 MED ORDER — ACETAMINOPHEN 325 MG PO TABS
650.0000 mg | ORAL_TABLET | Freq: Once | ORAL | Status: AC
  Administered 2012-03-10: 650 mg via ORAL

## 2012-03-10 MED ORDER — SODIUM CHLORIDE 0.9 % IJ SOLN
10.0000 mL | INTRAMUSCULAR | Status: DC | PRN
  Administered 2012-03-10: 10 mL
  Filled 2012-03-10: qty 10

## 2012-03-10 MED ORDER — TRASTUZUMAB CHEMO INJECTION 440 MG
6.0000 mg/kg | Freq: Once | INTRAVENOUS | Status: DC
  Administered 2012-03-10: 294 mg via INTRAVENOUS
  Filled 2012-03-10: qty 14

## 2012-03-10 MED ORDER — SODIUM CHLORIDE 0.9 % IV SOLN
Freq: Once | INTRAVENOUS | Status: AC
  Administered 2012-03-10: 13:00:00 via INTRAVENOUS

## 2012-03-10 MED ORDER — TEMAZEPAM 7.5 MG PO CAPS: 7.5000 mg | ORAL_CAPSULE | Freq: Every evening | ORAL | Status: DC | PRN

## 2012-03-10 NOTE — Telephone Encounter (Signed)
gv pt appt schedule for February thru June and appt for echo/bensimhon 2/13. Pt aware echo @ 8am and bensimhon 10:30am.

## 2012-03-10 NOTE — Progress Notes (Signed)
ID: Kirsten Gibson   DOB: 02/09/1946  MR#: [id number]  WUJ#:[id number]  PCP: Emeterio Reeve, MD GYN:  SU: Cicero Duck, Malvin Johns Lenis Noon OTHER NF:AOZHY Lexine Baton   HISTORY OF PRESENT ILLNESS: She palpated a right breast mass March 2013 and presented for a mammogram. This confirmed the presence of a 1.9 cm mass in the upper outer quadrants. And adjacent mass was noted which was possibly thought to represent a intramammary lymph node. A right axillary lymph node was also noted to be enlarged. Biopsy of the upper outer quadrant mass showed a grade 2 invasive ductal carcinoma which was ER/PR positive HER-2 positive. Ki 67 was 12%. An MRI of the breast was performed 05/10/2011 which showed an overall area of the right breast measuring 5.0 x 2.6 x 2.2 cm of clumped linear enhancement. Majority of this was felt to represent DCIS. A small area of clumped enhancement was noted in the left breast which was not seen on the mammogram. A biopsy of this area was recommended. A biopsy of the right axillary lymph node was also positive for invasive ductal carcinoma. The patient's subsequent history is as detailed below  INTERVAL HISTORY: Kirsten Gibson returns today with her husband Kirsten Gibson for followup of her breast cancer. Since her last visit here she completed her radiation treatments. She is now ready to start anti-estrogen therapy.  REVIEW OF SYSTEMS: She generally did well with the radiation, although she did get some desquamation. She is having a strange sensation as if having thousand so needles all up and down her arms and legs, accompanied by feeling of flushing. This is brief. I think it's an atypical "hot flash". She still has some blurred vision from her chemotherapy and is concerned that she has not yet completely corrected her prior anemia. She was on iron replacement with significant GI problems, but now that she is taking half dose those symptoms have resolved. She occasionally has  shooting pains are little twinge in her breasts, which she understands her postoperative and may occasionally occur at any point in the future. She has numbness in her fingers and toe tips and is currently on antibiotics for a tooth infection. Otherwise a detailed review of systems today was stable  PAST MEDICAL HISTORY: Past Medical History  Diagnosis Date  . Loose stools   . Frequency   . Anxiety   . TMJ click   . Breast cancer     bilateral infiltrating ductal carcinoma  . S/P chemotherapy, time since 4-12 weeks   . Rosacea   . Mycosis fungoides     follows up with [facility] once to twice per year   . Anemia due to chemotherapy 08/23/2011    PAST SURGICAL HISTORY: Past Surgical History  Procedure Date  . Cholecystectomy   . Benign tumor removed from neck 1974  . Rt breast biobsy   . Breast biopsy 05/19/11    left  and right breast  . Portacath placement 05/21/2011    Procedure: INSERTION PORT-A-CATH;  Surgeon: Currie Paris, MD;  Location: [facility];  Service: General;  Laterality: Left;  . Abdominal hysterectomy     partial with on ovary remaining.  [name] Biopsy breast     right breast biopsy     FAMILY HISTORY Family History  Problem Relation Age of Onset  . Cancer Maternal Aunt     breast to brain  . Cancer Paternal Grandmother     breast and/or ovarian///unknown  . Cancer Paternal Grandfather  stomach   the patient's father is still living, currently 53 years old. The patient's mother died at the age of 43 from complications of diabetes. The patient had no brothers, 3 sisters. One of the patient's mother is 2 sisters was diagnosed with breast cancer in her late 36s. There is no other history of breast or ovarian cancer in the family. The patient tells me she was evaluated for genetic testing at [facility], but that the insurance would not approve of the test.  GYNECOLOGIC HISTORY: Menarche age 78, she is GX P0. She underwent hysterectomy and unilateral  salpingo-oophorectomy in 1988. She took hormone replacement for approximately 3 years.  SOCIAL HISTORY: She used to work at [employer], but is now retired. Her husband of 21 years, Kirsten Gibson, used to work for the [employer] He has a son from a prior marriage, and 2 grandchildren. The patient attends [address]   ADVANCED DIRECTIVES: Not in place  HEALTH MAINTENANCE: History  Substance Use Topics  . Smoking status: Former Smoker -- 1.0 packs/day for 20 years    Types: Cigarettes    Quit date: 02/08/1986  . Smokeless tobacco: Never Used  . Alcohol Use: 0.0 oz/week    0-1 Glasses of wine per week     Comment: 2 glasses wine per month     Colonoscopy:  PAP:  Bone density:  Lipid panel:  Allergies  Allergen Reactions  . Latex Dermatitis and Rash  . Adhesive (Tape)     ALLERGIC TO OP-SITE......USE OP-SITE FLEXIGRID INSTEAD !!!  . Diclofenac Other (See Comments)    Fever, chills  . Naproxen Other (See Comments)    Fever, chills    Current Outpatient Prescriptions  Medication Sig Dispense Refill  . ALPRAZolam (XANAX) 0.5 MG tablet 2 tablets at night as needed for sleep  60 tablet  2  . calcium carbonate (OS-CAL) 600 MG TABS Take 600 mg by mouth 3 (three) times daily with meals.      . Cholecalciferol (VITAMIN D3 PO) Take 3,000 mg by mouth daily.       [name] emollient (BIAFINE) cream Apply topically as needed.      . iron polysaccharides (NIFEREX) 150 MG capsule Take 1 capsule (150 mg total) by mouth 2 (two) times daily.  60 capsule  1  . lidocaine-prilocaine (EMLA) cream APPLY TO PORT A CATH AS DIRECTED BEFORE CHEMO TREATMENT.  30 g  3  . non-metallic deodorant (ALRA) MISC Apply 1 application topically daily as needed.      . ondansetron (ZOFRAN) 8 MG tablet Take 1 tablet two times a day starting the day after chemo for 3 days. Then take 1 tab two times a day as needed for nausea or vomiting.  30 tablet  1  .  prochlorperazine (COMPAZINE) 10 MG tablet Take 1 tablet (10 mg total) by mouth every 6 (six) hours as needed (Nausea or vomiting).  30 tablet  1  . prochlorperazine (COMPAZINE) 25 MG suppository Place 1 suppository (25 mg total) rectally every 12 (twelve) hours as needed for nausea.  12 suppository  3  . Wound Cleansers (RADIAPLEX EX) Apply topically.      [name] amoxicillin-clavulanate (AUGMENTIN) 875-125 MG per tablet       . FLUVIRIN INJ injection         OBJECTIVE: Middle-aged white woman who appears anxious Filed Vitals:   03/10/12 1113  BP: 126/65  Pulse: 71  Temp: 98.4 F (36.9 Gibson)  Resp: 16  There is no height on file to calculate BMI.    ECOG FS: 1  Sclerae unicteric Oropharynx clear No cervical or supraclavicular adenopathy Lungs no rales or rhonchi Heart regular rate and rhythm Abd benign MSK no focal spinal tenderness, no peripheral edema Neuro: nonfocal Breasts: Status post bilateral mastectomies. There is erythema and minimal dry desquamation or the port site on the right. The left chest wall is unremarkable. Both axillae are benign   LAB RESULTS: Lab Results  Component Value Date   WBC 4.5 03/10/2012   NEUTROABS 3.1 03/10/2012   HGB 10.0* 03/10/2012   HCT 30.4* 03/10/2012   MCV 89.9 03/10/2012   PLT 170 03/10/2012      Chemistry      Component Value Date/Time   NA 139 02/18/2012 0959   NA 139 01/28/2012 1636   K 3.7 02/18/2012 0959   K 3.5 01/28/2012 1636   CL 101 02/18/2012 0959   CL 102 01/28/2012 1636   CO2 29 02/18/2012 0959   CO2 27 01/28/2012 1636   BUN 21.0 02/18/2012 0959   BUN 29* 01/28/2012 1636   CREATININE 0.8 02/18/2012 0959   CREATININE 1.01 01/28/2012 1636      Component Value Date/Time   CALCIUM 9.2 02/18/2012 0959   CALCIUM 9.2 01/28/2012 1636   ALKPHOS 81 02/18/2012 0959   ALKPHOS 84 01/28/2012 1636   AST 20 02/18/2012 0959   AST 23 01/28/2012 1636   ALT 13 02/18/2012 0959   ALT 20 01/28/2012 1636   BILITOT 0.22 02/18/2012 0959   BILITOT  0.2* 01/28/2012 1636       Lab Results  Component Value Date   LABCA2 21 02/18/2012    No components found with this basename: [id number]    No results found for this basename: INR:1;PROTIME:1 in the last 168 hours  Urinalysis No results found for this basename: colorurine,  appearanceur,  labspec,  phurine,  glucoseu,  hgbur,  bilirubinur,  ketonesur,  proteinur,  urobilinogen,  nitrite,  leukocytesur    STUDIES: Dg Bone Density  02/23/2012  *RADIOLOGY REPORT*  Clinical Data: Post menopausal osteoporosis screening.  DUAL X-RAY ABSORPTIOMETRY (DXA) FOR BONE MINERAL DENSITY  AP LUMBAR SPINE L1-L4  Bone Mineral Density (BMD):            0.829 g/cm2 Young Adult T Score:                          -2.0 Z Score:                                                -0.2  LEFT FEMUR NECK  Bone Mineral Density (BMD):             0.73 g/cm2 Young Adult T Score:                           -1.1 Z Score:                                                 0.4  ASSESSMENT:  Patient's diagnostic category is LOW BONE MASS by WHO Criteria.  FRACTURE RISK: INCREASED  FRAX: Based  on the World Health Organization FRAX model, the 10 year probability of a major osteoporotic fracture is 7.1%.  The 10 year probability of a hip fracture is 0.6%.  Comparison: None  RECOMMENDATIONS:  Effective therapies are available in the form of bisphosphonates, selective estrogen receptor modulators, biologic agents, and hormone replacement therapy (for women).  All patients should ensure an adequate intake of dietary calcium (1200mg  daily) and vitamin D (800 IU daily) unless contraindicated.  All treatment decisions require clinical judgement and consideration of individual patient factors, including patient preferences, co-morbidities, previous drug use, risk factors not captured in the FRAX model (e.g., frailty, falls, vitamin D deficiency, increased bone turnover, interval significant decline in bone density) and possible under-or  over-estimation of fracture risk by FRAX.  The National Osteoporosis Foundation recommends that FDA-approved medical therapies be considered in postmenopausal women and mean age 17 or older with a:        1)     Hip or vertebral (clinical or morphometric) fracture.           2)    T-score of -2.5 or lower at the spine or hip. 3)    Ten-year fracture probability by FRAX of 3% or greater for hip fracture or 20% or greater for major osteoporotic fracture. FOLLOW-UP:  People with diagnosed cases of osteoporosis or at high risk for fracture should have regular bone mineral density tests.  For patients eligible for Medicare, routine testing is allowed once every 2 years.  The testing frequency can be increased to one year for patients who have rapidly progressing disease, those who are receiving or discontinuing medical therapy to restore bone mass, or have additional risk factors.  World Science writer [facility]) Criteria:  Normal: T scores from +1.0 to -1.0 Low Bone Mass (Osteopenia): T scores between -1.0 and -2.5 Osteoporosis: T scores -2.5 and below  Comparison to Reference Population:  T score is the key measure used in the diagnosis of osteoporosis and relative risk determination for fracture.  It provides a value for bone mass relative to the mean bone mass of a young adult reference population expressed in terms of standard deviation (SD).  Z score is the age-matched score showing the patient's values compared to a population matched for age, sex, and race.  This is also expressed in terms of standard deviation.  The patient may have values that compare favorably to the age-matched values and still be at increased risk for fracture.   Original Report Authenticated By: Elberta Fortis, M.D.     ASSESSMENT: 67 y.o. [place] woman  (1) status post right breast and axillary lymph node biopsy 05/04/2011, both positive for a grade 2 invasive ductal carcinoma, cT2 pN1 or stage IIB,estrogen receptor 82% and  progesterone receptor 92% positive, with an MIB-1 of 12%, and HER-2 amplification by CISH with a ratio of 2.86  (2) biopsy 05/19/2011 of a second right breast area showed low-grade invasive ductal carcinoma, estrogen and progesterone receptor both 100% positive, with an MIB-1 of 13%, and no HER-2 amplification  (3) left breast biopsy 05/19/2011 showed a low-grade invasive ductal carcinoma, e-cadherin positive, HER-2 negative  (4) neoadjuvant treatment consisted of carboplatin/ docetaxel/ trastuzumab x6 completed 09/29/2011  (5) status post bilateral mastectomies at [facility] October 2013 showing  (a) on the right, a residual 1.2 cm invasive ductal carcinoma in the breast, with 3 of 19 lymph nodes involved; an additional lymph node showed only isolated tumor cells, so this is a ypT1c ypN1 result  (b)  on the left, there was a 1 mm area of residual invasive ductal carcinoma, with all 5 sentinel lymph nodes negative  (6) the patient completed postmastectomy radiation to the right chest wall and right supraclavicular fossa 02/28/2011  (7) status post-op pertuzumab x3, discontinued 01/28/2012  (8) trastuzumab resumed 02/18/2012, to be completed April of 2014. Gibson recent echocardiogram 12/16/2011 showed a well preserved ejection fraction.  (9) tamoxifen to be started 03/25/2012  PLAN: Kirsten Gibson is now ready to start antiestrogen. We spent Gibson of today's hour-long visit discussing the difference between tamoxifen and the aromatase inhibitors, and given the significant osteopenia as shown by the recent bone density, which we discussed in detail, we're going to start tamoxifen once she fully recovers from her radiation treatments. The target start date is February 15.  Kirsten Gibson has been working with Xanax as well as Benadryl to sleep. We discussed the fact that I really do not like to prescribe alprazolam up because of difficulties in withdrawal when the medication is stopped and because can be habit-forming  in my experience, in addition to developing tolerance as all the benzodiazepines can. After much discussion I gave her a Xanax taper (she will take 0.25 mg nightly for 5 days, then have fat for 5 days, then a half a 0.25 mg tablet every other night for a total of 10 days after which she will stop. I wrote her for Restoril 7.5 mg to take as needed, cautioning her that if she takes it regularly it will stop working for her. I encouraged her to use Benadryl for nighttime sleep problems.  I have scheduled a repeat echocardiogram with Dr. Gala Romney mid February, and of course we are continuing her trastuzumab treatments every 3 weeks in April. She knows to call for any problems that may develop before the next visit.  Kirsten Gibson    03/10/2012

## 2012-03-10 NOTE — Patient Instructions (Addendum)
[facility] Discharge Instructions for Patients Receiving Chemotherapy  Today you received the following chemotherapy agent Herceptin.  To help prevent nausea and vomiting after your treatment, we encourage you to take your nausea medication as often as prescribed for by Dr Magrinat.    If you develop nausea and vomiting that is not controlled by your nausea medication, call the clinic. If it is after clinic hours your family physician or the after hours number for the clinic or go to the Emergency Department.   BELOW ARE SYMPTOMS THAT SHOULD BE REPORTED IMMEDIATELY:  *FEVER GREATER THAN 100.5 F  *CHILLS WITH OR WITHOUT FEVER  NAUSEA AND VOMITING THAT IS NOT CONTROLLED WITH YOUR NAUSEA MEDICATION  *UNUSUAL SHORTNESS OF BREATH  *UNUSUAL BRUISING OR BLEEDING  TENDERNESS IN MOUTH AND THROAT WITH OR WITHOUT PRESENCE OF ULCERS  *URINARY PROBLEMS  *BOWEL PROBLEMS  UNUSUAL RASH Items with * indicate a potential emergency and should be followed up as soon as possible.  One of the nurses will contact you 24 hours after your treatment. Please let the nurse know about any problems that you may have experienced. Feel free to call the clinic you have any questions or concerns. The clinic phone number is [phone].   I have been informed and understand all the instructions given to me. I know to contact the clinic, my physician, or go to the Emergency Department if any problems should occur. I do not have any questions at this time, but understand that I may call the clinic during office hours   should I have any questions or need assistance in obtaining follow up care.    __________________________________________  _____________  __________ Signature of Patient or Authorized Representative            Date                   Time    __________________________________________ Nurse's Signature    

## 2012-03-10 NOTE — Progress Notes (Signed)
Patient ID: Kirsten Gibson, female   DOB: 10/17/1945, 68 y.o.   MRN: [id number]

## 2012-03-23 ENCOUNTER — Ambulatory Visit (HOSPITAL_COMMUNITY): Payer: Medicare Other

## 2012-03-27 ENCOUNTER — Ambulatory Visit (HOSPITAL_COMMUNITY)
Admission: RE | Admit: 2012-03-27 | Discharge: 2012-03-27 | Disposition: A | Discharge status: Home / Self Care | Payer: Medicare Other | Source: Ambulatory Visit | Attending: Oncology | Admitting: Oncology

## 2012-03-27 DIAGNOSIS — C50419 Malignant neoplasm of upper-outer quadrant of unspecified female breast: Secondary | ICD-10-CM

## 2012-03-27 DIAGNOSIS — I079 Rheumatic tricuspid valve disease, unspecified: Secondary | ICD-10-CM | POA: Diagnosis not present

## 2012-03-27 DIAGNOSIS — I059 Rheumatic mitral valve disease, unspecified: Secondary | ICD-10-CM | POA: Insufficient documentation

## 2012-03-27 NOTE — Progress Notes (Signed)
*  PRELIMINARY RESULTS* Echocardiogram 2D Echocardiogram has been performed.  Kirsten Gibson 03/27/2012, 11:55 AM

## 2012-03-29 ENCOUNTER — Encounter: Payer: Self-pay | Admitting: Radiation Oncology

## 2012-03-29 DIAGNOSIS — F419 Anxiety disorder, unspecified: Secondary | ICD-10-CM

## 2012-03-29 DIAGNOSIS — C50919 Malignant neoplasm of unspecified site of unspecified female breast: Secondary | ICD-10-CM

## 2012-03-29 DIAGNOSIS — L719 Rosacea, unspecified: Secondary | ICD-10-CM | POA: Insufficient documentation

## 2012-03-29 DIAGNOSIS — R29898 Other symptoms and signs involving the musculoskeletal system: Secondary | ICD-10-CM

## 2012-03-29 DIAGNOSIS — Z923 Personal history of irradiation: Secondary | ICD-10-CM

## 2012-03-29 DIAGNOSIS — Z9221 Personal history of antineoplastic chemotherapy: Secondary | ICD-10-CM

## 2012-03-30 ENCOUNTER — Ambulatory Visit
Admission: RE | Admit: 2012-03-30 | Discharge: 2012-03-30 | Disposition: A | Discharge status: Home / Self Care | Payer: Medicare Other | Source: Ambulatory Visit | Attending: Radiation Oncology | Admitting: Radiation Oncology

## 2012-03-30 VITALS — BP 122/67 | HR 75 | Temp 98.0°F | Resp 20 | Wt 104.2 lb

## 2012-03-30 NOTE — Progress Notes (Signed)
Pt reports fatigue immediately following her completion of radiation tx. Sghe occasionally has a sharp pain near her sternum and right breast. She is on Herceptin q 3 weeks. Taking Tamoxifen daily.

## 2012-03-30 NOTE — Progress Notes (Signed)
Department of Radiation Oncology  Phone:  [phone] Fax:        [phone]   Name: Kirsten Gibson MRN: [id number]  DOB: 25-Nov-1945  Date: 03/30/2012  Follow Up Visit Note  Diagnosis: Bilateral breast cancer (Right - ypT1cypTN2 M0, Left ypT17micypN0M0)  Summary and Interval since last radiation: Radiation to the right chest wall supraclavicular fossa scar and drain site her total dose of 60.4 gray to the scar completed 02/28/2012  Interval History: Kirsten Gibson presents today for routine followup.  She is a month out from her treatment. She's been taking tamoxifen for 5 days and feels like she can see her face wrinkling as she goes. She is concerned about weight loss. She is continuing on Herceptin is being monitored for that. She occasionally notes a sharp pain about a fingerbreadth below her scar on the right side in between her ribs. She was active in her yard today for lysing flowers before her appointment. She had some fatigue immediately following radiation but is slowly recovering her energy. She has an appointment medical oncology and will continue her Herceptin in March. She still has not told anyone about her diagnosis and relates to stories regarding women being identified as "breast cancer patient" as well as people focusing on the breast having no new patients undergo mastectomy. She said these reasons for not telling anyone about her diagnosis.  Allergies:  Allergies  Allergen Reactions  . Latex Dermatitis and Rash  . Adhesive (Tape)     ALLERGIC TO OP-SITE......USE OP-SITE FLEXIGRID INSTEAD !!!  . Diclofenac Other (See Comments)    Fever, chills  . Naproxen Other (See Comments)    Fever, chills    Medications:  Current Outpatient Prescriptions  Medication Sig Dispense Refill  . ALPRAZolam (XANAX) 0.25 MG tablet       . calcium carbonate (OS-CAL) 600 MG TABS Take 600 mg by mouth 3 (three) times daily with meals.      . Cholecalciferol (VITAMIN D3 PO) Take 3,000 mg by  mouth daily.       [name] FLUVIRIN INJ injection       . iron polysaccharides (NIFEREX) 150 MG capsule Take 1 capsule (150 mg total) by mouth 2 (two) times daily.  60 capsule  1  . lidocaine-prilocaine (EMLA) cream APPLY TO PORT A CATH AS DIRECTED BEFORE CHEMO TREATMENT.  30 g  3  . ondansetron (ZOFRAN) 8 MG tablet Take 1 tablet two times a day starting the day after chemo for 3 days. Then take 1 tab two times a day as needed for nausea or vomiting.  30 tablet  1  . prochlorperazine (COMPAZINE) 10 MG tablet Take 1 tablet (10 mg total) by mouth every 6 (six) hours as needed (Nausea or vomiting).  30 tablet  1  . tamoxifen (NOLVADEX) 20 MG tablet Take 1 tablet (20 mg total) by mouth daily.  90 tablet  12  . temazepam (RESTORIL) 7.5 MG capsule Take 1 capsule (7.5 mg total) by mouth at bedtime as needed for sleep.  30 capsule  3   No current facility-administered medications for this encounter.    Physical Exam:  Filed Vitals:   03/30/12 1411  BP: 122/67  Pulse: 75  Temp: 98 F (36.7 C)  Resp: 20   her skin of the right chest wall is healed up well. There is still some hyperpigmentation. There is some very slight hyperpigmentation of a couple birth marks on her back. Otherwise she is healed up well. No visible  or palpable signs of tumor recurrence in the right chest wall supraclavicular fossa or axilla.  IMPRESSION: Kirsten Gibson is a 67 y.o. female status post bilateral breast cancers with bilateral mastectomies and radiation to the right chest wall with resolving acute effects of treatment  PLAN:  She looks great. We again discussed the L. and the long road that is dealing with her cancer. We discussed the personality changes and the different perspective she may have after having a cancer diagnosis. We discussed that tamoxifen does not usually cause weight loss. We discussed the use of exercise and building her stamina. We discussed use of supplements increase her weight. I have not scheduled followup with  me. He be happy to see her back at any point in the future. She is regular scheduled followup with medical oncology and surgery.    Lurline Hare, MD

## 2012-03-31 ENCOUNTER — Ambulatory Visit (HOSPITAL_COMMUNITY)
Admission: RE | Admit: 2012-03-31 | Discharge: 2012-03-31 | Disposition: A | Discharge status: Home / Self Care | Payer: Medicare Other | Source: Ambulatory Visit | Attending: Oncology | Admitting: Oncology

## 2012-03-31 ENCOUNTER — Ambulatory Visit (HOSPITAL_BASED_OUTPATIENT_CLINIC_OR_DEPARTMENT_OTHER): Payer: Medicare Other

## 2012-03-31 ENCOUNTER — Other Ambulatory Visit: Payer: Self-pay | Admitting: *Deleted

## 2012-03-31 ENCOUNTER — Other Ambulatory Visit (HOSPITAL_BASED_OUTPATIENT_CLINIC_OR_DEPARTMENT_OTHER): Payer: Medicare Other | Admitting: Lab

## 2012-03-31 VITALS — BP 128/73 | HR 73 | Temp 97.7°F

## 2012-03-31 DIAGNOSIS — C50912 Malignant neoplasm of unspecified site of left female breast: Secondary | ICD-10-CM

## 2012-03-31 DIAGNOSIS — C50119 Malignant neoplasm of central portion of unspecified female breast: Secondary | ICD-10-CM | POA: Diagnosis not present

## 2012-03-31 DIAGNOSIS — C50919 Malignant neoplasm of unspecified site of unspecified female breast: Secondary | ICD-10-CM

## 2012-03-31 DIAGNOSIS — C773 Secondary and unspecified malignant neoplasm of axilla and upper limb lymph nodes: Secondary | ICD-10-CM | POA: Diagnosis not present

## 2012-03-31 DIAGNOSIS — C50419 Malignant neoplasm of upper-outer quadrant of unspecified female breast: Secondary | ICD-10-CM

## 2012-03-31 DIAGNOSIS — Z5112 Encounter for antineoplastic immunotherapy: Secondary | ICD-10-CM | POA: Diagnosis not present

## 2012-03-31 LAB — CBC WITH DIFFERENTIAL/PLATELET
EOS%: 1.9 % (ref 0.0–7.0)
Eosinophils Absolute: 0.1 10*3/uL (ref 0.0–0.5)
LYMPH%: 36.2 % (ref 14.0–49.7)
MCH: 30.2 pg (ref 25.1–34.0)
MCV: 89.8 fL (ref 79.5–101.0)
MONO%: 11.6 % (ref 0.0–14.0)
NEUT#: 2.3 10*3/uL (ref 1.5–6.5)
Platelets: 183 10*3/uL (ref 145–400)
RBC: 3.24 10*6/uL — ABNORMAL LOW (ref 3.70–5.45)
nRBC: 0 % (ref 0–0)

## 2012-03-31 LAB — COMPREHENSIVE METABOLIC PANEL (CC13)
Alkaline Phosphatase: 92 U/L (ref 40–150)
CO2: 26 mEq/L (ref 22–29)
Creatinine: 0.8 mg/dL (ref 0.6–1.1)
Glucose: 85 mg/dl (ref 70–99)
Total Bilirubin: 0.28 mg/dL (ref 0.20–1.20)

## 2012-03-31 MED ORDER — TRASTUZUMAB CHEMO INJECTION 440 MG
6.0000 mg/kg | Freq: Once | INTRAVENOUS | Status: AC
  Administered 2012-03-31: 294 mg via INTRAVENOUS
  Filled 2012-03-31: qty 14

## 2012-03-31 MED ORDER — HEPARIN SOD (PORK) LOCK FLUSH 100 UNIT/ML IV SOLN
500.0000 [IU] | Freq: Once | INTRAVENOUS | Status: DC | PRN
  Filled 2012-03-31: qty 5

## 2012-03-31 MED ORDER — DIPHENHYDRAMINE HCL 25 MG PO CAPS
25.0000 mg | ORAL_CAPSULE | Freq: Once | ORAL | Status: AC
  Administered 2012-03-31: 25 mg via ORAL

## 2012-03-31 MED ORDER — ACETAMINOPHEN 325 MG PO TABS
650.0000 mg | ORAL_TABLET | Freq: Once | ORAL | Status: AC
  Administered 2012-03-31: 650 mg via ORAL

## 2012-03-31 MED ORDER — SODIUM CHLORIDE 0.9 % IV SOLN
Freq: Once | INTRAVENOUS | Status: AC
  Administered 2012-03-31: 13:00:00 via INTRAVENOUS

## 2012-03-31 MED ORDER — SODIUM CHLORIDE 0.9 % IJ SOLN
10.0000 mL | INTRAMUSCULAR | Status: DC | PRN
  Filled 2012-03-31: qty 10

## 2012-03-31 NOTE — Patient Instructions (Addendum)
[facility] Discharge Instructions for Patients Receiving Chemotherapy  Today you received the following chemotherapy agents Herceptin.  To help prevent nausea and vomiting after your treatment, we encourage you to take your nausea medication as prescribed.   If you develop nausea and vomiting that is not controlled by your nausea medication, call the clinic. If it is after clinic hours your family physician or the after hours number for the clinic or go to the Emergency Department.   BELOW ARE SYMPTOMS THAT SHOULD BE REPORTED IMMEDIATELY:  *FEVER GREATER THAN 100.5 F  *CHILLS WITH OR WITHOUT FEVER  NAUSEA AND VOMITING THAT IS NOT CONTROLLED WITH YOUR NAUSEA MEDICATION  *UNUSUAL SHORTNESS OF BREATH  *UNUSUAL BRUISING OR BLEEDING  TENDERNESS IN MOUTH AND THROAT WITH OR WITHOUT PRESENCE OF ULCERS  *URINARY PROBLEMS  *BOWEL PROBLEMS  UNUSUAL RASH Items with * indicate a potential emergency and should be followed up as soon as possible.  Feel free to call the clinic you have any questions or concerns. The clinic phone number is [phone].   I have been informed and understand all the instructions given to me. I know to contact the clinic, my physician, or go to the Emergency Department if any problems should occur. I do not have any questions at this time, but understand that I may call the clinic during office hours   should I have any questions or need assistance in obtaining follow up care.    __________________________________________  _____________  __________ Signature of Patient or Authorized Representative            Date                   Time    __________________________________________ Nurse's Signature    

## 2012-03-31 NOTE — Progress Notes (Signed)
Port accessed, flushes easily, no blood return noted. Patient denied any discomfort. Unable to obtain blood return after several flushes. Swelling noted around port area, saline removed from under skin. Port needle removed and port reaccessed but still unable to obtain blood return, some swelling noted after flushing with saline, saline removed. Dr. Darnelle Catalan notified and patient scheduled for study in interventional radiology today to assess port. Peripheral IV started for patient to receive Herceptin.

## 2012-04-03 ENCOUNTER — Telehealth: Payer: Self-pay | Admitting: *Deleted

## 2012-04-03 ENCOUNTER — Other Ambulatory Visit: Payer: Self-pay | Admitting: Oncology

## 2012-04-03 DIAGNOSIS — D47Z9 Other specified neoplasms of uncertain behavior of lymphoid, hematopoietic and related tissue: Secondary | ICD-10-CM | POA: Diagnosis not present

## 2012-04-03 NOTE — Telephone Encounter (Signed)
This RN discussed with pt results of dye study post exam on Friday.  Site of port was clean, dry and without redness or swelling, site was not tender to touch.  Per MD review recommended for pt to have port removed and obtain herceptin peripherally for 3 more treatments.  This RN reviewed with MD pt's surgical history and per his review - recommendation of obtaining a picc line or  use L arm for IV sticks. This RN discussed above with pt and she would prefer not to have picc line.  This RN faxed report with request for removal to Dr Jamey Ripa.

## 2012-04-04 ENCOUNTER — Telehealth (INDEPENDENT_AMBULATORY_CARE_PROVIDER_SITE_OTHER): Payer: Self-pay | Admitting: General Surgery

## 2012-04-04 ENCOUNTER — Other Ambulatory Visit (INDEPENDENT_AMBULATORY_CARE_PROVIDER_SITE_OTHER): Payer: Self-pay | Admitting: Surgery

## 2012-04-04 NOTE — Telephone Encounter (Signed)
Spoke with patient. She is okay with no preop appt. I told her I would have Dr Jamey Ripa write orders and we would get her scheduled. She wants this only with local anesthesia.

## 2012-04-04 NOTE — Telephone Encounter (Signed)
Message copied by Liliana Cline on Tue Apr 04, 2012  8:50 AM ------      Message from: Currie Paris      Created: Tue Apr 04, 2012  6:12 AM       I can do it this week in LDOW room - does she want me to do it. She went to [facility] for her surgery. If she wants to see me preop then she will have to wait.      ----- Message -----         From: Liliana Cline, CMA         Sent: 04/03/2012   3:36 PM           To: Currie Paris, MD            I got a note from Dr Darnelle Catalan that patient needs her Port removed ASAP. It looks like it is malpositioned. If you could write orders.             Thanks            [employer]       ------

## 2012-04-05 NOTE — Progress Notes (Signed)
Local-arrive 45 min prior-no questions

## 2012-04-06 ENCOUNTER — Ambulatory Visit (HOSPITAL_BASED_OUTPATIENT_CLINIC_OR_DEPARTMENT_OTHER)
Admission: RE | Admit: 2012-04-06 | Discharge: 2012-04-06 | Disposition: A | Discharge status: Home / Self Care | Payer: Medicare Other | Source: Ambulatory Visit | Attending: Surgery | Admitting: Surgery

## 2012-04-06 ENCOUNTER — Ambulatory Visit (HOSPITAL_BASED_OUTPATIENT_CLINIC_OR_DEPARTMENT_OTHER)
Admission: RE | Admit: 2012-04-06 | Discharge: 2012-04-06 | Disposition: A | Discharge status: Home / Self Care | Payer: Medicare Other | Source: Ambulatory Visit | Attending: Internal Medicine | Admitting: Internal Medicine

## 2012-04-06 ENCOUNTER — Encounter (HOSPITAL_BASED_OUTPATIENT_CLINIC_OR_DEPARTMENT_OTHER): Payer: Self-pay | Admitting: *Deleted

## 2012-04-06 ENCOUNTER — Encounter (HOSPITAL_COMMUNITY): Payer: Self-pay

## 2012-04-06 ENCOUNTER — Encounter (HOSPITAL_BASED_OUTPATIENT_CLINIC_OR_DEPARTMENT_OTHER)
Admission: RE | Disposition: A | Discharge status: Home / Self Care | Payer: Self-pay | Source: Ambulatory Visit | Attending: Surgery

## 2012-04-06 VITALS — BP 101/64 | HR 80 | Ht 60.0 in | Wt 105.8 lb

## 2012-04-06 DIAGNOSIS — Z452 Encounter for adjustment and management of vascular access device: Secondary | ICD-10-CM | POA: Diagnosis not present

## 2012-04-06 DIAGNOSIS — C50919 Malignant neoplasm of unspecified site of unspecified female breast: Secondary | ICD-10-CM

## 2012-04-06 HISTORY — PX: PORT-A-CATH REMOVAL: SHX5289

## 2012-04-06 SURGERY — MINOR REMOVAL PORT-A-CATH
Anesthesia: LOCAL | Site: Chest | Laterality: Left | Wound class: Clean

## 2012-04-06 MED ORDER — SODIUM BICARBONATE 4 % IV SOLN
INTRAVENOUS | Status: DC | PRN
  Administered 2012-04-06: 3 mL via INTRAVENOUS

## 2012-04-06 MED ORDER — LIDOCAINE-EPINEPHRINE (PF) 1 %-1:200000 IJ SOLN
INTRAMUSCULAR | Status: DC | PRN
  Administered 2012-04-06: 30 mL

## 2012-04-06 SURGICAL SUPPLY — 39 items
ADH SKN CLS APL DERMABOND .7 (GAUZE/BANDAGES/DRESSINGS) ×1
APL SKNCLS STERI-STRIP NONHPOA (GAUZE/BANDAGES/DRESSINGS)
BENZOIN TINCTURE PRP APPL 2/3 (GAUZE/BANDAGES/DRESSINGS) IMPLANT
BLADE SURG 15 STRL LF DISP TIS (BLADE) ×1 IMPLANT
BLADE SURG 15 STRL SS (BLADE) ×2
CHLORAPREP W/TINT 26ML (MISCELLANEOUS) ×2 IMPLANT
CLOTH BEACON ORANGE TIMEOUT ST (SAFETY) ×2 IMPLANT
DERMABOND ADVANCED (GAUZE/BANDAGES/DRESSINGS) ×1
DERMABOND ADVANCED .7 DNX12 (GAUZE/BANDAGES/DRESSINGS) IMPLANT
DRSG TEGADERM 4X4.75 (GAUZE/BANDAGES/DRESSINGS) IMPLANT
ELECT REM PT RETURN 9FT ADLT (ELECTROSURGICAL) ×2
ELECTRODE REM PT RTRN 9FT ADLT (ELECTROSURGICAL) IMPLANT
GAUZE SPONGE 4X4 12PLY STRL LF (GAUZE/BANDAGES/DRESSINGS) IMPLANT
GAUZE SPONGE 4X4 16PLY XRAY LF (GAUZE/BANDAGES/DRESSINGS) IMPLANT
GLOVE BIO SURGEON STRL SZ 6.5 (GLOVE) IMPLANT
GLOVE ECLIPSE 6.5 STRL STRAW (GLOVE) IMPLANT
GLOVE EUDERMIC 7 POWDERFREE (GLOVE) ×1 IMPLANT
GLOVE SKINSENSE NS SZ6.5 (GLOVE) ×1
GLOVE SKINSENSE NS SZ7.0 (GLOVE) ×1
GLOVE SKINSENSE NS SZ8.0 LF (GLOVE) ×1
GLOVE SKINSENSE STRL SZ6.5 (GLOVE) IMPLANT
GLOVE SKINSENSE STRL SZ7.0 (GLOVE) IMPLANT
GLOVE SKINSENSE STRL SZ8.0 LF (GLOVE) IMPLANT
MARKER SKIN DUAL TIP RULER LAB (MISCELLANEOUS) ×1 IMPLANT
NDL HYPO 25X1 1.5 SAFETY (NEEDLE) ×1 IMPLANT
NDL SAFETY ECLIPSE 18X1.5 (NEEDLE) ×1 IMPLANT
NEEDLE HYPO 18GX1.5 SHARP (NEEDLE) ×2
NEEDLE HYPO 25X1 1.5 SAFETY (NEEDLE) ×4 IMPLANT
PENCIL BUTTON HOLSTER BLD 10FT (ELECTRODE) ×1 IMPLANT
STRIP CLOSURE SKIN 1/2X4 (GAUZE/BANDAGES/DRESSINGS) IMPLANT
SUT MNCRL AB 4-0 PS2 18 (SUTURE) ×2 IMPLANT
SUT VIC AB 3-0 FS2 27 (SUTURE) IMPLANT
SUT VIC AB 3-0 SH 27 (SUTURE) ×2
SUT VIC AB 3-0 SH 27X BRD (SUTURE) IMPLANT
SUT VIC AB 4-0 BRD 54 (SUTURE) IMPLANT
SUT VIC AB 4-0 P-3 18XBRD (SUTURE) IMPLANT
SUT VIC AB 4-0 P3 18 (SUTURE)
SUT VIC AB 4-0 SH 18 (SUTURE) ×1 IMPLANT
SYR CONTROL 10ML LL (SYRINGE) ×3 IMPLANT

## 2012-04-06 NOTE — Patient Instructions (Addendum)
Follow up 3 months with echo

## 2012-04-06 NOTE — OR Nursing (Signed)
Patient voices understanding of post-op instructions and denies pain, dizziness.  Discharged ambulatory with husband.

## 2012-04-06 NOTE — Op Note (Signed)
Kirsten Gibson [birth year] [id number] 04/05/2012  Preoperative diagnosis: Un-Needed PAC  Postoperative diagnosis: Same  Procedure: Portacath Removal  Surgeon: Currie Paris, MD, FACS  Anesthesia:Local   Clinical History and Indications: The patient has finished her chemotherapy and no longer needs a port. She wishes to have it removed.  Procedure: The patient was seen in the preoperative area and we confirmed the plans for the procedure as noted above. The Port-A-Cath site was identified and marked. The patient had no further questions.  The patient was then taken into the procedure room. The timeout was done. The area over the Port-A-Cath was anesthetized with 1% Xylocaine with epinephrine. I waited about 10 minutes and then the area was prepped and draped.  The old scar was opened. The capsule around the port opened and the port identified.There were no holding sutures and the tubing was already out of the vein. The port was then removed from its pocket. I made sure everything was dry. The incision was closed with 3-0 Vicryl, 4-0 Monocryl subcuticular, and Dermabond.  The patient tolerated the procedure well. There were no complications.  Currie Paris, MD, FACS 04/06/2012 11:29 AM

## 2012-04-06 NOTE — H&P (Signed)
  NAME: Kirsten Gibson       DOB: July 28, 1945           DATE: 04/05/2012       ZDG:[id number]  CC: No chief complaint on file. Port not working  HPI: Patient has a non-functioning port and it appears the tubing has backe out. In addition the port is now sited very lateral and was moved at the time of her surgery   EXAM: Vital signs: BP 140/79  Pulse 70  Temp(Src) 98.1 F (36.7 C) (Oral)  Resp 73  SpO2 98%  General: Patient alert, oriented, NAD  Port is palpable near the anterior axillary line and the tubing appears to be coiled in a sub q pocket IMP: Non-functioning port  PLAN: Removal under local. Reviewed plans with patient and husband and marked the port site

## 2012-04-07 ENCOUNTER — Telehealth (INDEPENDENT_AMBULATORY_CARE_PROVIDER_SITE_OTHER): Payer: Self-pay | Admitting: General Surgery

## 2012-04-07 NOTE — Telephone Encounter (Signed)
Patient states unless she is having issues  Dr  Jamey Ripa advise she did not need po f/u appt

## 2012-04-08 NOTE — Assessment & Plan Note (Signed)
Risk of trastuzamab cardiotoxicity and role of cardio-oncology clinic reviewed in detail with her and her husband. Echos reviewed personally. She is tolerating therapy very well and is OK to continue Herceptin. Will follow q3 months until end of therapy. Extensive reassurance provided.

## 2012-04-08 NOTE — Progress Notes (Signed)
Referring Physician: Dr. Darnelle Catalan Primary Care: Dr. Mila Palmer   HPI: Kirsten Gibson is a 68 y.o. female with history of anxiety, anemia, and diagnosis of stage IIB invasive ductal carcinoma that is ER/PR and HER-2 positive on 05/04/11.  A second area on right breast showed low-grade invasive ductal carcinoma that was ER/PR positive but HER-2 negative on 05/19/11.  Left breast bx at the time showed low grade invasive ductal carcinoma, e-cadherin positive and Her-2 negative.  She completed 6 cycles of neoadjuvant therapy with carboplatin/ docetaxel/ trastuzumab on 09/29/2011.  S/p bilateral mastectomies at [facility] October 2013.  Completed radiation to right chest wall and right supraclavicular fossa 02/2012.  She had 3 cycles of pertuzumab that was discontinued on 01/28/12.  She was restarted on trastuzumab 02/18/12 with plans for completion 05/2012.   She has been referred to the cardio-onc clinic for the remainder of her therapy.  She states she had a rough time with her combined therapy but is tolerating herceptin better.  She had elevated heart rate at the beginning of her therapy but this has been improving.  She denies edema, orthopnea or PND.  No chest pain.  She appears extremely anxious about her situation.   Review of Systems: [y] = yes, [ ]  = no   General: Weight gain [ ] ; Weight loss [ ] ; Anorexia [ ] ; Fatigue [ ] ; Fever [ ] ; Chills [ ] ; Weakness [ ]   Cardiac: Chest pain/pressure [ ] ; Resting SOB [ ] ; Exertional SOB [ ] ; Orthopnea [ ] ; Pedal Edema [ ] ; Palpitations [ ] ; Syncope [ ] ; Presyncope [ ] ; Paroxysmal nocturnal dyspnea[ ]   Pulmonary: Cough [ ] ; Wheezing[ ] ; Hemoptysis[ ] ; Sputum [ ] ; Snoring [ ]   GI: Vomiting[ ] ; Dysphagia[ ] ; Melena[ ] ; Hematochezia [ ] ; Heartburn[ ] ; Abdominal pain [ ] ; Constipation [ ] ; Diarrhea [ ] ; BRBPR [ ]   GU: Hematuria[ ] ; Dysuria [ ] ; Nocturia[ ]   Vascular: Pain in legs with walking [ ] ; Pain in feet with lying flat [ ] ; Non-healing sores [ ] ; Stroke [  ]; TIA [ ] ; Slurred speech [ ] ;  Neuro: Headaches[ ] ; Vertigo[ ] ; Seizures[ ] ; Paresthesias[ ] ;Blurred vision [ ] ; Diplopia [ ] ; Vision changes [ ]   Ortho/Skin: Arthritis [ ] ; Joint pain [ ] ; Muscle pain [ ] ; Joint swelling [ ] ; Back Pain [ ] ; Rash [ ]   Psych: Depression[ ] ; Anxiety[y ]  Heme: Bleeding problems [ ] ; Clotting disorders [ ] ; Anemia [ ]   Endocrine: Diabetes [ ] ; Thyroid dysfunction[ ]    Past Medical History  Diagnosis Date  . Loose stools   . Frequency   . Anxiety   . TMJ click   . Breast cancer     bilateral infiltrating ductal carcinoma  . S/P chemotherapy, time since 4-12 weeks   . Rosacea   . Mycosis fungoides     follows up with [facility] once to twice per year   . Anemia due to chemotherapy 08/23/2011  . Hx of radiation therapy 01/11/12- 02/28/12    right chest wall/supraclav fossa/drain site/scar boost    Current Outpatient Prescriptions  Medication Sig Dispense Refill  . ALPRAZolam (XANAX) 0.25 MG tablet       . calcium carbonate (OS-CAL) 600 MG TABS Take 600 mg by mouth 3 (three) times daily with meals.      . Cholecalciferol (VITAMIN D3 PO) Take 3,000 mg by mouth daily.       . iron polysaccharides (NIFEREX) 150 MG capsule Take 1 capsule (150  mg total) by mouth 2 (two) times daily.  60 capsule  1  . ondansetron (ZOFRAN) 8 MG tablet Take 1 tablet two times a day starting the day after chemo for 3 days. Then take 1 tab two times a day as needed for nausea or vomiting.  30 tablet  1  . prochlorperazine (COMPAZINE) 10 MG tablet Take 1 tablet (10 mg total) by mouth every 6 (six) hours as needed (Nausea or vomiting).  30 tablet  1  . tamoxifen (NOLVADEX) 20 MG tablet Take 1 tablet (20 mg total) by mouth daily.  90 tablet  12  . temazepam (RESTORIL) 7.5 MG capsule Take 1 capsule (7.5 mg total) by mouth at bedtime as needed for sleep.  30 capsule  3  . FLUVIRIN INJ injection       . lidocaine-prilocaine (EMLA) cream APPLY TO PORT A CATH AS DIRECTED BEFORE CHEMO TREATMENT.   30 g  3   No current facility-administered medications for this encounter.    Allergies  Allergen Reactions  . Latex Dermatitis and Rash  . Adhesive (Tape)     ALLERGIC TO OP-SITE......USE OP-SITE FLEXIGRID INSTEAD !!!  . Diclofenac Other (See Comments)    Fever, chills  . Naproxen Other (See Comments)    Fever, chills    History   Social History  . Marital Status: Married    Spouse Name: N/A    Number of Children: N/A  . Years of Education: N/A   Occupational History  . Not on file.   Social History Main Topics  . Smoking status: Former Smoker -- 1.00 packs/day for 20 years    Types: Cigarettes    Quit date: 02/08/1986  . Smokeless tobacco: Never Used  . Alcohol Use: 0.0 oz/week    0-1 Glasses of wine per week     Comment: 2 glasses wine per month  . Drug Use: No  . Sexually Active: Not on file   Other Topics Concern  . Not on file   Social History Narrative  . No narrative on file    Family History  Problem Relation Age of Onset  . Cancer Maternal Aunt     breast to brain  . Cancer Paternal Grandmother     breast and/or ovarian///unknown  . Cancer Paternal Grandfather     stomach    PHYSICAL EXAM: Filed Vitals:   04/06/12 1450  BP: 101/64  Pulse: 80  Height: 5' (1.524 m)  Weight: 105 lb 12.8 oz (47.991 kg)    General:  Thin appearing. No respiratory difficulty HEENT: normal Neck: supple. no JVD. Carotids 2+ bilat; no bruits. No lymphadenopathy or thryomegaly appreciated. Cor: PMI nondisplaced. Regular rate & rhythm. No rubs, gallops or murmurs. Lungs: clear Abdomen: soft, nontender, nondistended. No hepatosplenomegaly. No bruits or masses. Good bowel sounds. Extremities: no cyanosis, clubbing, rash, edema Neuro: alert & oriented x 3, cranial nerves grossly intact. moves all 4 extremities w/o difficulty. Affect pleasant.   ASSESSMENT & PLAN:

## 2012-04-10 ENCOUNTER — Encounter (HOSPITAL_BASED_OUTPATIENT_CLINIC_OR_DEPARTMENT_OTHER): Payer: Self-pay | Admitting: Surgery

## 2012-04-11 DIAGNOSIS — R0982 Postnasal drip: Secondary | ICD-10-CM | POA: Diagnosis not present

## 2012-04-11 DIAGNOSIS — Z23 Encounter for immunization: Secondary | ICD-10-CM | POA: Diagnosis not present

## 2012-04-11 DIAGNOSIS — M949 Disorder of cartilage, unspecified: Secondary | ICD-10-CM | POA: Diagnosis not present

## 2012-04-11 DIAGNOSIS — IMO0002 Reserved for concepts with insufficient information to code with codable children: Secondary | ICD-10-CM | POA: Diagnosis not present

## 2012-04-11 DIAGNOSIS — D649 Anemia, unspecified: Secondary | ICD-10-CM | POA: Diagnosis not present

## 2012-04-11 DIAGNOSIS — C50919 Malignant neoplasm of unspecified site of unspecified female breast: Secondary | ICD-10-CM | POA: Diagnosis not present

## 2012-04-14 ENCOUNTER — Encounter: Payer: Self-pay | Admitting: Oncology

## 2012-04-14 ENCOUNTER — Encounter: Payer: Self-pay | Admitting: *Deleted

## 2012-04-14 ENCOUNTER — Telehealth: Payer: Self-pay | Admitting: Oncology

## 2012-04-14 ENCOUNTER — Other Ambulatory Visit (HOSPITAL_BASED_OUTPATIENT_CLINIC_OR_DEPARTMENT_OTHER): Payer: Medicare Other | Admitting: Lab

## 2012-04-14 ENCOUNTER — Telehealth: Payer: Self-pay | Admitting: *Deleted

## 2012-04-14 ENCOUNTER — Other Ambulatory Visit: Payer: Self-pay | Admitting: *Deleted

## 2012-04-14 DIAGNOSIS — C50419 Malignant neoplasm of upper-outer quadrant of unspecified female breast: Secondary | ICD-10-CM | POA: Diagnosis not present

## 2012-04-14 DIAGNOSIS — K625 Hemorrhage of anus and rectum: Secondary | ICD-10-CM | POA: Diagnosis not present

## 2012-04-14 LAB — CBC WITH DIFFERENTIAL/PLATELET
BASO%: 0.6 % (ref 0.0–2.0)
Basophils Absolute: 0 10*3/uL (ref 0.0–0.1)
Eosinophils Absolute: 0.1 10*3/uL (ref 0.0–0.5)
HCT: 29 % — ABNORMAL LOW (ref 34.8–46.6)
HGB: 9.7 g/dL — ABNORMAL LOW (ref 11.6–15.9)
MONO#: 0.5 10*3/uL (ref 0.1–0.9)
NEUT#: 2.6 10*3/uL (ref 1.5–6.5)
NEUT%: 54.2 % (ref 38.4–76.8)
Platelets: 173 10*3/uL (ref 145–400)
WBC: 4.8 10*3/uL (ref 3.9–10.3)
lymph#: 1.6 10*3/uL (ref 0.9–3.3)

## 2012-04-14 NOTE — Progress Notes (Signed)
No entry 

## 2012-04-14 NOTE — Telephone Encounter (Signed)
Patient called on 37 8:00 am reporting a bloody bowel movement twice. Her first was last night around 9 pm, the other was this am. She is on Tamoxifen and no blood thinners.  She reports no symptoms of dizziness or weakness.  I advised her to go to the ED or call her GI doctor if she has one.  I also gave her the option to reach Dr. Darnelle Catalan once the office opens.

## 2012-04-14 NOTE — Telephone Encounter (Signed)
Per phone conversation with

## 2012-04-14 NOTE — Progress Notes (Addendum)
Per call this AM pt came in for lab draw and to follow up per concern for " blood in stool" ( called to on call MD early am ).   CBC obtained with out change from 03/31/2012.   Vital signs obtained without significance. Pt denies dizziness or shortness of breath.  Per discussion with pt she states episode of " severe straining " with bowel movement pm 3/6. She noted blood droplets in commode and " with final wiping of tissue"  She denies any frank large clots, or coffee ground texture in toilet. Nor has she had any spotting in underwear of blood.  This AM she had second stool without straining with some blood noted again- as above. She does not have any pain in abdomen nor nausea.  Pt is on iron tablets for known anemia.  Per above discussion and review with Dr Welton Flakes plan at present is for pt to hold iron over the weekend. Stool cards given for sample. Written instructions given for above as well as if symptoms continue or worsens she needs to proceed immediately to the ER.  Pt has been seen previously at [facility] but is requesting transfer of care to [facility]. This office will place referral.

## 2012-04-16 ENCOUNTER — Other Ambulatory Visit: Payer: Self-pay | Admitting: Oncology

## 2012-04-17 ENCOUNTER — Other Ambulatory Visit: Payer: Self-pay | Admitting: *Deleted

## 2012-04-17 DIAGNOSIS — K625 Hemorrhage of anus and rectum: Secondary | ICD-10-CM | POA: Insufficient documentation

## 2012-04-18 ENCOUNTER — Other Ambulatory Visit: Payer: Self-pay | Admitting: *Deleted

## 2012-04-18 ENCOUNTER — Other Ambulatory Visit: Payer: Self-pay | Admitting: Oncology

## 2012-04-18 DIAGNOSIS — K625 Hemorrhage of anus and rectum: Secondary | ICD-10-CM | POA: Diagnosis not present

## 2012-04-18 DIAGNOSIS — C50419 Malignant neoplasm of upper-outer quadrant of unspecified female breast: Secondary | ICD-10-CM | POA: Diagnosis not present

## 2012-04-18 MED ORDER — TEMAZEPAM 15 MG PO CAPS: 15.0000 mg | ORAL_CAPSULE | Freq: Every evening | ORAL | Status: DC | PRN

## 2012-04-20 ENCOUNTER — Encounter: Payer: Self-pay | Admitting: Internal Medicine

## 2012-04-20 ENCOUNTER — Other Ambulatory Visit: Payer: Self-pay | Admitting: Physician Assistant

## 2012-04-21 ENCOUNTER — Other Ambulatory Visit (HOSPITAL_BASED_OUTPATIENT_CLINIC_OR_DEPARTMENT_OTHER): Payer: Medicare Other | Admitting: Lab

## 2012-04-21 ENCOUNTER — Ambulatory Visit (HOSPITAL_BASED_OUTPATIENT_CLINIC_OR_DEPARTMENT_OTHER): Payer: Medicare Other

## 2012-04-21 ENCOUNTER — Telehealth: Payer: Self-pay | Admitting: Oncology

## 2012-04-21 VITALS — BP 106/64 | HR 69

## 2012-04-21 DIAGNOSIS — Z5112 Encounter for antineoplastic immunotherapy: Secondary | ICD-10-CM | POA: Diagnosis not present

## 2012-04-21 DIAGNOSIS — C50419 Malignant neoplasm of upper-outer quadrant of unspecified female breast: Secondary | ICD-10-CM | POA: Diagnosis not present

## 2012-04-21 DIAGNOSIS — C773 Secondary and unspecified malignant neoplasm of axilla and upper limb lymph nodes: Secondary | ICD-10-CM | POA: Diagnosis not present

## 2012-04-21 DIAGNOSIS — C50411 Malignant neoplasm of upper-outer quadrant of right female breast: Secondary | ICD-10-CM

## 2012-04-21 DIAGNOSIS — C50119 Malignant neoplasm of central portion of unspecified female breast: Secondary | ICD-10-CM

## 2012-04-21 LAB — CBC WITH DIFFERENTIAL/PLATELET
Basophils Absolute: 0 10*3/uL (ref 0.0–0.1)
Eosinophils Absolute: 0 10*3/uL (ref 0.0–0.5)
HGB: 9.7 g/dL — ABNORMAL LOW (ref 11.6–15.9)
LYMPH%: 33.5 % (ref 14.0–49.7)
MONO#: 0.4 10*3/uL (ref 0.1–0.9)
NEUT#: 2.1 10*3/uL (ref 1.5–6.5)
Platelets: 176 10*3/uL (ref 145–400)
RBC: 3.22 10*6/uL — ABNORMAL LOW (ref 3.70–5.45)
WBC: 3.9 10*3/uL (ref 3.9–10.3)
nRBC: 0 % (ref 0–0)

## 2012-04-21 MED ORDER — SODIUM CHLORIDE 0.9 % IV SOLN: Freq: Once | INTRAVENOUS | Status: DC

## 2012-04-21 MED ORDER — TRASTUZUMAB CHEMO INJECTION 440 MG
6.0000 mg/kg | Freq: Once | INTRAVENOUS | Status: AC
  Administered 2012-04-21: 294 mg via INTRAVENOUS
  Filled 2012-04-21: qty 14

## 2012-04-21 MED ORDER — ACETAMINOPHEN 325 MG PO TABS
650.0000 mg | ORAL_TABLET | Freq: Once | ORAL | Status: AC
  Administered 2012-04-21: 650 mg via ORAL

## 2012-04-21 MED ORDER — DIPHENHYDRAMINE HCL 25 MG PO CAPS
25.0000 mg | ORAL_CAPSULE | Freq: Once | ORAL | Status: AC
  Administered 2012-04-21: 25 mg via ORAL

## 2012-04-21 NOTE — Telephone Encounter (Signed)
Pt is scheduled to see Dr. Juanda Chance on 04/16 @ 11.

## 2012-04-21 NOTE — Patient Instructions (Addendum)
[facility] Discharge Instructions for Patients Receiving Chemotherapy  Today you received the following chemotherapy agents herceptin  To help prevent nausea and vomiting after your treatment, we encourage you to take your nausea medication  and take it as often as prescribedIf you develop nausea and vomiting that is not controlled by your nausea medication, call the clinic. If it is after clinic hours your family physician or the after hours number for the clinic or go to the Emergency Department.   BELOW ARE SYMPTOMS THAT SHOULD BE REPORTED IMMEDIATELY:  *FEVER GREATER THAN 100.5 F  *CHILLS WITH OR WITHOUT FEVER  NAUSEA AND VOMITING THAT IS NOT CONTROLLED WITH YOUR NAUSEA MEDICATION  *UNUSUAL SHORTNESS OF BREATH  *UNUSUAL BRUISING OR BLEEDING  TENDERNESS IN MOUTH AND THROAT WITH OR WITHOUT PRESENCE OF ULCERS  *URINARY PROBLEMS  *BOWEL PROBLEMS  UNUSUAL RASH Items with * indicate a potential emergency and should be followed up as soon as possible.  One of the nurses will contact you 24 hours after your treatment. Please let the nurse know about any problems that you may have experienced. Feel free to call the clinic you have any questions or concerns. The clinic phone number is [phone].   I have been informed and understand all the instructions given to me. I know to contact the clinic, my physician, or go to the Emergency Department if any problems should occur. I do not have any questions at this time, but understand that I may call the clinic during office hours   should I have any questions or need assistance in obtaining follow up care.    __________________________________________  _____________  __________ Signature of Patient or Authorized Representative            Date                   Time    __________________________________________ Nurse's Signature    

## 2012-04-30 ENCOUNTER — Other Ambulatory Visit: Payer: Self-pay | Admitting: Oncology

## 2012-05-03 ENCOUNTER — Other Ambulatory Visit: Payer: Self-pay | Admitting: *Deleted

## 2012-05-03 DIAGNOSIS — D6481 Anemia due to antineoplastic chemotherapy: Secondary | ICD-10-CM

## 2012-05-03 DIAGNOSIS — C50411 Malignant neoplasm of upper-outer quadrant of right female breast: Secondary | ICD-10-CM

## 2012-05-03 NOTE — Progress Notes (Signed)
Called and spoke with patient about some questions and concerns she has.  A lot of her questions she states can wait until she sees Amy Berry,PA on 05/12/12.  She wanted to make sure her prescriptions are correct at [facility].  She is requesting 90 day supply of tamoxifen and 90 day supply of restoril.  She takes 7.5mg  before she goes to bed and another 7.5mg  about 2 in the am because she states this keeps her asleep through the night.  I called [facility] and made sure all prescriptions are correct.  Pt. Aware of date and time of her 05/12/12 appt.

## 2012-05-10 ENCOUNTER — Ambulatory Visit (AMBULATORY_SURGERY_CENTER): Payer: Medicare Other | Admitting: *Deleted

## 2012-05-10 ENCOUNTER — Other Ambulatory Visit: Payer: Self-pay | Admitting: *Deleted

## 2012-05-10 ENCOUNTER — Telehealth: Payer: Self-pay | Admitting: *Deleted

## 2012-05-10 VITALS — Ht 60.0 in | Wt 103.0 lb

## 2012-05-10 DIAGNOSIS — Z1211 Encounter for screening for malignant neoplasm of colon: Secondary | ICD-10-CM

## 2012-05-10 MED ORDER — MOVIPREP 100 G PO SOLR: ORAL | Status: DC

## 2012-05-10 NOTE — Telephone Encounter (Signed)
Noted  

## 2012-05-10 NOTE — Progress Notes (Signed)
Patient states her last colonoscopy was Jan.2009 with Dr.Hayes at [facility]. She also states she had seen blood clots in her stool on March 7,2014, which lasted for several days. She did call her oncologist and did stool cards which was negative x 3. Patient has not seen blood in stool since then. Patient is a breast cancer patient. Patient does not know results of last colonoscopy but did show me recall letter from [facility] saying the colonoscopy was due. Release of information signed and given to Dottie,CMA.

## 2012-05-10 NOTE — Telephone Encounter (Signed)
Patient states her last colonoscopy was Jan.2009 with Dr.Hayes at [facility]. She has recall letter from that office stating it was time for her recall procedure. Patient could not remember what the colon results were. She is a breast cancer patient and did see blood clots in her stool on March 7th 2014, but said it only lasted for several days. She called her oncologist and did stool cards which was negative x3 per patient. She denies seeing any blood since that stopped. Release of information signed and given to Dottie,CMA.

## 2012-05-12 ENCOUNTER — Ambulatory Visit (HOSPITAL_BASED_OUTPATIENT_CLINIC_OR_DEPARTMENT_OTHER): Payer: Medicare Other

## 2012-05-12 ENCOUNTER — Ambulatory Visit: Payer: Medicare Other | Admitting: Physician Assistant

## 2012-05-12 ENCOUNTER — Ambulatory Visit (HOSPITAL_BASED_OUTPATIENT_CLINIC_OR_DEPARTMENT_OTHER): Payer: Medicare Other | Admitting: Physician Assistant

## 2012-05-12 ENCOUNTER — Other Ambulatory Visit (HOSPITAL_BASED_OUTPATIENT_CLINIC_OR_DEPARTMENT_OTHER): Payer: Medicare Other | Admitting: Lab

## 2012-05-12 ENCOUNTER — Telehealth: Payer: Self-pay | Admitting: *Deleted

## 2012-05-12 ENCOUNTER — Encounter: Payer: Self-pay | Admitting: Physician Assistant

## 2012-05-12 ENCOUNTER — Other Ambulatory Visit: Payer: Medicare Other | Admitting: Lab

## 2012-05-12 VITALS — BP 134/75 | HR 68 | Temp 98.5°F | Resp 20 | Ht 61.0 in | Wt 102.6 lb

## 2012-05-12 DIAGNOSIS — Z170 Estrogen receptor positive status [ER+]: Secondary | ICD-10-CM | POA: Diagnosis not present

## 2012-05-12 DIAGNOSIS — T451X5A Adverse effect of antineoplastic and immunosuppressive drugs, initial encounter: Secondary | ICD-10-CM

## 2012-05-12 DIAGNOSIS — D6481 Anemia due to antineoplastic chemotherapy: Secondary | ICD-10-CM

## 2012-05-12 DIAGNOSIS — C50419 Malignant neoplasm of upper-outer quadrant of unspecified female breast: Secondary | ICD-10-CM

## 2012-05-12 DIAGNOSIS — C50119 Malignant neoplasm of central portion of unspecified female breast: Secondary | ICD-10-CM

## 2012-05-12 DIAGNOSIS — C50912 Malignant neoplasm of unspecified site of left female breast: Secondary | ICD-10-CM

## 2012-05-12 DIAGNOSIS — C50411 Malignant neoplasm of upper-outer quadrant of right female breast: Secondary | ICD-10-CM

## 2012-05-12 DIAGNOSIS — F419 Anxiety disorder, unspecified: Secondary | ICD-10-CM

## 2012-05-12 DIAGNOSIS — Z5112 Encounter for antineoplastic immunotherapy: Secondary | ICD-10-CM

## 2012-05-12 LAB — COMPREHENSIVE METABOLIC PANEL (CC13)
ALT: 19 U/L (ref 0–55)
AST: 21 U/L (ref 5–34)
Alkaline Phosphatase: 58 U/L (ref 40–150)
Sodium: 139 mEq/L (ref 136–145)
Total Bilirubin: 0.36 mg/dL (ref 0.20–1.20)
Total Protein: 7 g/dL (ref 6.4–8.3)

## 2012-05-12 LAB — CBC WITH DIFFERENTIAL/PLATELET
Basophils Absolute: 0 10*3/uL (ref 0.0–0.1)
HCT: 31.4 % — ABNORMAL LOW (ref 34.8–46.6)
HGB: 10.4 g/dL — ABNORMAL LOW (ref 11.6–15.9)
MCH: 30.8 pg (ref 25.1–34.0)
MONO#: 0.5 10*3/uL (ref 0.1–0.9)
NEUT%: 51 % (ref 38.4–76.8)
Platelets: 171 10*3/uL (ref 145–400)
WBC: 4.5 10*3/uL (ref 3.9–10.3)
lymph#: 1.7 10*3/uL (ref 0.9–3.3)

## 2012-05-12 LAB — IRON AND TIBC
%SAT: 18 % — ABNORMAL LOW (ref 20–55)
Iron: 65 ug/dL (ref 42–145)
UIBC: 299 ug/dL (ref 125–400)

## 2012-05-12 MED ORDER — DIPHENHYDRAMINE HCL 25 MG PO CAPS
25.0000 mg | ORAL_CAPSULE | Freq: Once | ORAL | Status: AC
  Administered 2012-05-12: 25 mg via ORAL

## 2012-05-12 MED ORDER — ACETAMINOPHEN 325 MG PO TABS
650.0000 mg | ORAL_TABLET | Freq: Once | ORAL | Status: AC
  Administered 2012-05-12: 650 mg via ORAL

## 2012-05-12 MED ORDER — TRASTUZUMAB CHEMO INJECTION 440 MG
6.0000 mg/kg | Freq: Once | INTRAVENOUS | Status: AC
  Administered 2012-05-12: 294 mg via INTRAVENOUS
  Filled 2012-05-12: qty 14

## 2012-05-12 MED ORDER — TEMAZEPAM 7.5 MG PO CAPS: ORAL_CAPSULE | ORAL | Status: DC

## 2012-05-12 MED ORDER — TAMOXIFEN CITRATE 20 MG PO TABS: 20.0000 mg | ORAL_TABLET | Freq: Every day | ORAL | Status: DC

## 2012-05-12 MED ORDER — SODIUM CHLORIDE 0.9 % IV SOLN
Freq: Once | INTRAVENOUS | Status: AC
  Administered 2012-05-12: 13:00:00 via INTRAVENOUS

## 2012-05-12 NOTE — Progress Notes (Signed)
ID: AANIYA STERBA   DOB: 11-02-1945  MR#: [id number]  WUJ#:[id number]  PCP: Emeterio Reeve, MD GYN:  SU: Cicero Duck, Malvin Johns Lenis Noon OTHER NF:AOZHY Lexine Baton   HISTORY OF PRESENT ILLNESS: She palpated a right breast mass March 2013 and presented for a mammogram. This confirmed the presence of a 1.9 cm mass in the upper outer quadrants. And adjacent mass was noted which was possibly thought to represent a intramammary lymph node. A right axillary lymph node was also noted to be enlarged. Biopsy of the upper outer quadrant mass showed a grade 2 invasive ductal carcinoma which was ER/PR positive HER-2 positive. Ki 67 was 12%. An MRI of the breast was performed 05/10/2011 which showed an overall area of the right breast measuring 5.0 x 2.6 x 2.2 cm of clumped linear enhancement. Majority of this was felt to represent DCIS. A small area of clumped enhancement was noted in the left breast which was not seen on the mammogram. A biopsy of this area was recommended. A biopsy of the right axillary lymph node was also positive for invasive ductal carcinoma. The patient's subsequent history is as detailed below  INTERVAL HISTORY: Jeronimo Norma returns today with her husband Leonette Most for followup of her breast cancer. She continues to receive trastuzumab every 3 weeks, and is due for one dose today, and one final dose in 3 weeks on April 25. Her most recent echocardiogram was in mid February.    Interval history is remarkable for patient having started tamoxifen in February. She is tolerating the medication well. She denies any significant hot flashes. She does have a history of cataracts which she thinks are going to need surgery soon. She has some increased blurred vision as a result.   Interval history is also remarkable for the patient having been evaluated at the lymphedema clinic. She will need bilateral compression sleeves, primarily for prevention of lymphedema if she flies.  Fortunately, at this time, she is having no significant swelling in either the right or left upper extremity .   REVIEW OF SYSTEMS: Jeronimo Norma has had no illnesses and denies any fevers or chills. She's had no rashes or skin changes and denies any abnormal bruising or bleeding. She does have a little bit of a runny nose and occasional nosebleeds. She has an occasional dry cough, but denies any phlegm production, shortness of breath, chest pain, or palpitations. She's had no abnormal headaches or dizziness. She had some pain in the leg muscle a couple of weeks ago after stretching but this has resolved. She has no current myalgias, arthralgias, or bony pain, denies any peripheral swelling.  Jeanie's biggest concern today is her weight. She is concerned that she has continued to lose weight, and is having a difficult time eating. She denies any nausea or emesis and is having regular bowel movements, but simply has a decreased appetite and a decreased interest in food. She still has some mild taste alteration, likely residual from her chemotherapy.  Otherwise a detailed review of systems today was stable  PAST MEDICAL HISTORY: Past Medical History  Diagnosis Date  . Loose stools   . Frequency   . Anxiety   . TMJ click   . S/P chemotherapy, time since 4-12 weeks   . Rosacea   . Anemia due to chemotherapy 08/23/2011  . Hx of radiation therapy 01/11/12- 02/28/12    right chest wall/supraclav fossa/drain site/scar boost  . Breast cancer     bilateral infiltrating ductal carcinoma  .  Mycosis fungoides     follows up with [facility] once to twice per year     PAST SURGICAL HISTORY: Past Surgical History  Procedure Laterality Date  . Cholecystectomy    . Benign tumor removed from neck  1974  . Rt breast biobsy    . Breast biopsy  05/19/11    left  and right breast  . Portacath placement  05/21/2011    Procedure: INSERTION PORT-A-CATH;  Surgeon: Currie Paris, MD;  Location: [facility];  Service:  General;  Laterality: Left;  . Abdominal hysterectomy  1988    partial with on ovary remaining.  [name] Biopsy breast      right breast biopsy   . Mastectomy  11/2011    bilat, [facility]  . Port-a-cath removal Left 04/06/2012    Procedure: MINOR REMOVAL PORT-A-CATH;  Surgeon: Currie Paris, MD;  Location: [facility];  Service: General;  Laterality: Left;    FAMILY HISTORY Family History  Problem Relation Age of Onset  . Cancer Maternal Aunt     breast to brain  . Cancer Paternal Grandmother     breast and/or ovarian///unknown  . Cancer Paternal Grandfather     stomach  . Colon cancer Neg Hx    the patient's father is still living, currently 32 years old. The patient's mother died at the age of 6 from complications of diabetes. The patient had no brothers, 3 sisters. One of the patient's mother is 2 sisters was diagnosed with breast cancer in her late 78s. There is no other history of breast or ovarian cancer in the family. The patient tells me she was evaluated for genetic testing at [facility], but that the insurance would not approve of the test.  GYNECOLOGIC HISTORY: Menarche age 64, she is GX P0. She underwent hysterectomy and unilateral salpingo-oophorectomy in 1988. She took hormone replacement for approximately 3 years.  SOCIAL HISTORY: She used to work at [employer], but is now retired. Her husband of 21 years, Leonette Most, used to work for the [employer] He has a son from a prior marriage, and 2 grandchildren. The patient attends [address]   ADVANCED DIRECTIVES: Not in place  HEALTH MAINTENANCE: History  Substance Use Topics  . Smoking status: Former Smoker -- 1.00 packs/day for 20 years    Types: Cigarettes    Quit date: 02/08/1986  . Smokeless tobacco: Never Used  . Alcohol Use: No     Colonoscopy:  Scheduled for April 2014, [place]  PAP:  Bone density: 02/23/2012,  osteopenia  Lipid panel:  Allergies  Allergen Reactions  . Latex Dermatitis and Rash  . Adhesive (Tape)     ALLERGIC TO OP-SITE......USE OP-SITE FLEXIGRID INSTEAD !!!  . Zofran (Ondansetron Hcl) Swelling    Swelling&redness to face!!  . Diclofenac Other (See Comments)    Fever, chills  . Naproxen Other (See Comments)    Fever, chills    Current Outpatient Prescriptions  Medication Sig Dispense Refill  . calcium carbonate (OS-CAL) 600 MG TABS Take 600 mg by mouth 3 (three) times daily with meals.      . Cholecalciferol (VITAMIN D3 PO) Take 3,000 mg by mouth daily.       Tery Sanfilippo Calcium (STOOL SOFTENER PO) Take 50 mg by mouth 2 (two) times daily.      [name] DOXYLAMINE SUCCINATE, SLEEP, PO Take 15 mg by mouth at bedtime.      [name] FLUVIRIN INJ injection       .  MOVIPREP 100 G SOLR Take as directed.  1 kit  0  . Multiple Vitamins-Minerals (MULTIVITAMIN PO) Take 1 capsule by mouth daily.      . Omega 3-6-9 Fatty Acids (OMEGA-3 & OMEGA-6 FISH OIL PO) Take 720 mg by mouth daily.      . Omega-3 Fatty Acids (OMEGA 3 PO) Take 2,200 mg by mouth daily.      [name] POLY-IRON 150 150 MG capsule TAKE ONE CAPSULE BY MOUTH TWICE DAILY  60 capsule  2  . tamoxifen (NOLVADEX) 20 MG tablet Take 1 tablet (20 mg total) by mouth daily.  90 tablet  4  . temazepam (RESTORIL) 7.5 MG capsule 1-2 tabs by mouth at bedtime as needed for sleep  180 capsule  0   No current facility-administered medications for this visit.    OBJECTIVE: Middle-aged white woman  In no acute distress  Filed Vitals:   05/12/12 1055  BP: 134/75  Pulse: 68  Temp: 98.5 F (36.9 C)  Resp: 20     Body mass index is 19.4 kg/(m^2).    ECOG FS: 1 Filed Weights   05/12/12 1055  Weight: 102 lb 9.6 oz (46.539 kg)   Sclerae unicteric Oropharynx clear No cervical or supraclavicular adenopathy Lungs are clear to auscultation, no rales or rhonchi Heart regular rate and rhythm Abdomen soft, nontender, thin, positive bowel sounds MSK no focal  spinal tenderness, no peripheral edema Neuro: nonfocal, well oriented Breasts: Status post bilateral mastectomies. Incisions are well healed, with no suspicious nodularity or skin changes, no evidence of local recurrence. The port has been removed from the left upper chest wall. Axillae are benign bilaterally with no palpable adenopathy noted.    LAB RESULTS: Lab Results  Component Value Date   WBC 4.5 05/12/2012   NEUTROABS 2.3 05/12/2012   HGB 10.4* 05/12/2012   HCT 31.4* 05/12/2012   MCV 92.9 05/12/2012   PLT 171 05/12/2012      Chemistry      Component Value Date/Time   NA 139 03/31/2012 1112   NA 139 01/28/2012 1636   K 3.7 03/31/2012 1112   K 3.5 01/28/2012 1636   CL 104 03/31/2012 1112   CL 102 01/28/2012 1636   CO2 26 03/31/2012 1112   CO2 27 01/28/2012 1636   BUN 25.2 03/31/2012 1112   BUN 29* 01/28/2012 1636   CREATININE 0.8 03/31/2012 1112   CREATININE 1.01 01/28/2012 1636      Component Value Date/Time   CALCIUM 9.4 03/31/2012 1112   CALCIUM 9.2 01/28/2012 1636   ALKPHOS 92 03/31/2012 1112   ALKPHOS 84 01/28/2012 1636   AST 23 03/31/2012 1112   AST 23 01/28/2012 1636   ALT 21 03/31/2012 1112   ALT 20 01/28/2012 1636   BILITOT 0.28 03/31/2012 1112   BILITOT 0.2* 01/28/2012 1636       Lab Results  Component Value Date   LABCA2 21 02/18/2012     STUDIES:  Most recent echocardiogram on 03/27/2012 July preserved ejection fraction of 60-65%.   Dg Bone Density  02/23/2012  *RADIOLOGY REPORT*  Clinical Data: Post menopausal osteoporosis screening.  DUAL X-RAY ABSORPTIOMETRY (DXA) FOR BONE MINERAL DENSITY  AP LUMBAR SPINE L1-L4  Bone Mineral Density (BMD):            0.829 g/cm2 Young Adult T Score:                          -2.0 Z Score:                                                -  0.2  LEFT FEMUR NECK  Bone Mineral Density (BMD):             0.73 g/cm2 Young Adult T Score:                           -1.1 Z Score:                                                 0.4   ASSESSMENT:  Patient's diagnostic category is LOW BONE MASS by WHO Criteria.  FRACTURE RISK: INCREASED  FRAX: Based on the World Health Organization FRAX model, the 10 year probability of a major osteoporotic fracture is 7.1%.  The 10 year probability of a hip fracture is 0.6%.  Comparison: None  RECOMMENDATIONS:  Effective therapies are available in the form of bisphosphonates, selective estrogen receptor modulators, biologic agents, and hormone replacement therapy (for women).  All patients should ensure an adequate intake of dietary calcium (1200mg  daily) and vitamin D (800 IU daily) unless contraindicated.  All treatment decisions require clinical judgement and consideration of individual patient factors, including patient preferences, co-morbidities, previous drug use, risk factors not captured in the FRAX model (e.g., frailty, falls, vitamin D deficiency, increased bone turnover, interval significant decline in bone density) and possible under-or over-estimation of fracture risk by FRAX.  The National Osteoporosis Foundation recommends that FDA-approved medical therapies be considered in postmenopausal women and mean age 16 or older with a:        1)     Hip or vertebral (clinical or morphometric) fracture.           2)    T-score of -2.5 or lower at the spine or hip. 3)    Ten-year fracture probability by FRAX of 3% or greater for hip fracture or 20% or greater for major osteoporotic fracture. FOLLOW-UP:  People with diagnosed cases of osteoporosis or at high risk for fracture should have regular bone mineral density tests.  For patients eligible for Medicare, routine testing is allowed once every 2 years.  The testing frequency can be increased to one year for patients who have rapidly progressing disease, those who are receiving or discontinuing medical therapy to restore bone mass, or have additional risk factors.  World Science writer [facility]) Criteria:  Normal: T scores from +1.0 to -1.0 Low Bone Mass  (Osteopenia): T scores between -1.0 and -2.5 Osteoporosis: T scores -2.5 and below  Comparison to Reference Population:  T score is the key measure used in the diagnosis of osteoporosis and relative risk determination for fracture.  It provides a value for bone mass relative to the mean bone mass of a young adult reference population expressed in terms of standard deviation (SD).  Z score is the age-matched score showing the patient's values compared to a population matched for age, sex, and race.  This is also expressed in terms of standard deviation.  The patient may have values that compare favorably to the age-matched values and still be at increased risk for fracture.   Original Report Authenticated By: Elberta Fortis, M.D.     ASSESSMENT: 67 y.o. [place] woman  (1) status post right breast and axillary lymph node biopsy 05/04/2011, both positive for a grade 2 invasive ductal carcinoma, cT2 pN1 or stage IIB,estrogen receptor 82% and progesterone receptor 92% positive,  with an MIB-1 of 12%, and HER-2 amplification by CISH with a ratio of 2.86  (2) biopsy 05/19/2011 of a second right breast area showed low-grade invasive ductal carcinoma, estrogen and progesterone receptor both 100% positive, with an MIB-1 of 13%, and no HER-2 amplification  (3) left breast biopsy 05/19/2011 showed a low-grade invasive ductal carcinoma, e-cadherin positive, HER-2 negative  (4) neoadjuvant treatment consisted of carboplatin/ docetaxel/ trastuzumab x6 completed 09/29/2011  (5) status post bilateral [facility]ctomies [facility] October 2013 showing  (a) on the right, a residual 1.2 cm invasive ductal carcinoma in the breast, with 3 of 19 lymph nodes involved; an additional lymph node showed only isolated tumor cells, so this is a ypT1c ypN1 result  (b) on the left, there was a 1 mm area of residual invasive ductal carcinoma, with all 5 sentinel lymph nodes negative  (6) the patient completed postmastectomy  radiation to the right chest wall and right supraclavicular fossa 02/28/2011  (7) status post-op pertuzumab x3, discontinued 01/28/2012  (8) trastuzumab resumed 02/18/2012, to be completed April of 2014. Most recent echocardiogram 03/27/2012 showed a well preserved ejection fraction.  (9) tamoxifen started 03/25/2012  PLAN:  Over half of our one-hour appointment today was spent reviewing Jeanie's many questions, discussing her long-term treatment plan, and coordinating care. She will continue on tamoxifen which I have refilled for her today with a 90 day supply. She also needed her Restoril filled with a 90 day prescription as well. She was provided with orders for a left and right compression sleeve for treatment of lymphedema status post bilateral mastectomies and axillary node removal.  We discussed using supplements such as  Boost or Ensure  for weight maintenance, and she might want to consider meeting once again with a nutritionist .  Jeronimo Norma will proceed to treatment today as scheduled for her next dose of trastuzumab, and receive her final q. three-week dose in 3 weeks, April 25. We will repeat her echocardiogram in mid May, and she'll see Dr. Darnelle Catalan in late May or early June for brief followup to establish her long-term plan. She would like to discuss with him restaging scans which would likely be obtained in another year.  She knows to call for any problems that may develop before the next visit.  Lazaro Isenhower    05/12/2012

## 2012-05-12 NOTE — Telephone Encounter (Signed)
appts made and printed 

## 2012-05-12 NOTE — Patient Instructions (Addendum)
[facility] Discharge Instructions for Patients Receiving Chemotherapy  Today you received the following chemotherapy agents: herceptin  To help prevent nausea and vomiting after your treatment, we encourage you to take your nausea medication.  Take it as often as prescribed.     If you develop nausea and vomiting that is not controlled by your nausea medication, call the clinic. If it is after clinic hours your family physician or the after hours number for the clinic or go to the Emergency Department.   BELOW ARE SYMPTOMS THAT SHOULD BE REPORTED IMMEDIATELY:  *FEVER GREATER THAN 100.5 F  *CHILLS WITH OR WITHOUT FEVER  NAUSEA AND VOMITING THAT IS NOT CONTROLLED WITH YOUR NAUSEA MEDICATION  *UNUSUAL SHORTNESS OF BREATH  *UNUSUAL BRUISING OR BLEEDING  TENDERNESS IN MOUTH AND THROAT WITH OR WITHOUT PRESENCE OF ULCERS  *URINARY PROBLEMS  *BOWEL PROBLEMS  UNUSUAL RASH Items with * indicate a potential emergency and should be followed up as soon as possible.  Feel free to call the clinic you have any questions or concerns. The clinic phone number is [phone].   I have been informed and understand all the instructions given to me. I know to contact the clinic, my physician, or go to the Emergency Department if any problems should occur. I do not have any questions at this time, but understand that I may call the clinic during office hours   should I have any questions or need assistance in obtaining follow up care.    __________________________________________  _____________  __________ Signature of Patient or Authorized Representative            Date                   Time    __________________________________________ Nurse's Signature    

## 2012-05-15 ENCOUNTER — Encounter: Payer: Self-pay | Admitting: Oncology

## 2012-05-16 ENCOUNTER — Ambulatory Visit: Payer: Medicare Other | Admitting: Internal Medicine

## 2012-05-22 ENCOUNTER — Other Ambulatory Visit: Payer: Self-pay | Admitting: *Deleted

## 2012-05-22 ENCOUNTER — Encounter: Payer: Self-pay | Admitting: *Deleted

## 2012-05-22 NOTE — Progress Notes (Signed)
RECEIVED A FAX FROM [facility] CONCERNING A PRIOR AUTHORIZATION FOR TEMAZEPAM. THIS REQUEST WAS PLACED IN THE MANAGED CARE BIN. 

## 2012-05-23 ENCOUNTER — Encounter: Payer: Self-pay | Admitting: Oncology

## 2012-05-23 NOTE — Progress Notes (Signed)
[employer] approved temazepam 7.5mg  180 tabs from 03/25/12-05/23/13.

## 2012-05-24 ENCOUNTER — Encounter: Payer: Self-pay | Admitting: Internal Medicine

## 2012-05-24 ENCOUNTER — Ambulatory Visit (AMBULATORY_SURGERY_CENTER): Payer: BC Managed Care – PPO | Admitting: Internal Medicine

## 2012-05-24 VITALS — BP 130/67 | HR 65 | Temp 97.0°F | Resp 19 | Ht 60.0 in | Wt 103.0 lb

## 2012-05-24 DIAGNOSIS — K625 Hemorrhage of anus and rectum: Secondary | ICD-10-CM

## 2012-05-24 DIAGNOSIS — Z853 Personal history of malignant neoplasm of breast: Secondary | ICD-10-CM | POA: Diagnosis not present

## 2012-05-24 DIAGNOSIS — Z1211 Encounter for screening for malignant neoplasm of colon: Secondary | ICD-10-CM

## 2012-05-24 DIAGNOSIS — D649 Anemia, unspecified: Secondary | ICD-10-CM | POA: Diagnosis not present

## 2012-05-24 MED ORDER — HYDROCORTISONE ACETATE 25 MG RE SUPP: 25.0000 mg | Freq: Every evening | RECTAL | Status: DC | PRN

## 2012-05-24 MED ORDER — SODIUM CHLORIDE 0.9 % IV SOLN: 500.0000 mL | INTRAVENOUS | Status: DC

## 2012-05-24 NOTE — Op Note (Signed)
[facility] [address] [place], [address]   COLONOSCOPY PROCEDURE REPORT  PATIENT: Kirsten, Gibson  MR#: [id number] BIRTHDATE: [birth year] , 67  yrs. old GENDER: Female ENDOSCOPIST: Hart Carwin, MD REFERRED BY:  Mila Palmer, M.D. ,Dr G.Magrinat PROCEDURE DATE:  05/24/2012 PROCEDURE:   Colonoscopy, screening and Colonoscopy, diagnostic ASA CLASS:   Class II INDICATIONS:Average risk patient for colon cancer, hematochezia, and low volume hematochezia, hx of breast cancer,2009 colonoscopy was normal. MEDICATIONS: MAC sedation, administered by CRNA and propofol (Diprivan) 150mg  IV  DESCRIPTION OF PROCEDURE:   After the risks and benefits and of the procedure were explained, informed consent was obtained.  A digital rectal exam revealed no abnormalities of the rectum.    The LB [id number]  endoscope was introduced through the anus and advanced to the cecum, which was identified by both the appendix and ileocecal valve .  The quality of the prep was excellent, using MoviPrep .  The instrument was then slowly withdrawn as the colon was fully examined.     COLON FINDINGS: Small internal hemorrhoids were found. Retroflexed views revealed no abnormalities.     The scope was then withdrawn from the patient and the procedure completed.  COMPLICATIONS: There were no complications. ENDOSCOPIC IMPRESSION: Small internal hemorrhoids Few scattered diverticuli  RECOMMENDATIONS: High fiber diet Anusol HC supp ,1 hs   REPEAT EXAM: In 10 year(s)  for Colonoscopy.  cc:  _______________________________ eSignedHart Carwin, MD 05/24/2012 12:08 PM     PATIENT NAME:  Kirsten, Gibson MR#: [id number]

## 2012-05-24 NOTE — Progress Notes (Signed)
Patient did not have preoperative order for IV antibiotic SSI prophylaxis. (G8918)  Patient did not experience any of the following events: a burn prior to discharge; a fall within the facility; wrong site/side/patient/procedure/implant event; or a hospital transfer or hospital admission upon discharge from the facility. (G8907)  

## 2012-05-24 NOTE — Progress Notes (Signed)
No complaints noted in the recovery room. Maw  Patient did not have preoperative order for IV antibiotic SSI prophylaxis. (G8918) Patient did not experience any of the following events: a burn prior to discharge; a fall within the facility; wrong site/side/patient/procedure/implant event; or a hospital transfer or hospital admission upon discharge from the facility. (G8907)  

## 2012-05-24 NOTE — Patient Instructions (Addendum)
YOU HAD AN ENDOSCOPIC PROCEDURE TODAY AT THE  ENDOSCOPY CENTER: Refer to the procedure report that was given to you for any specific questions about what was found during the examination.  If the procedure report does not answer your questions, please call your gastroenterologist to clarify.  If you requested that your care partner not be given the details of your procedure findings, then the procedure report has been included in a sealed envelope for you to review at your convenience later.  YOU SHOULD EXPECT: Some feelings of bloating in the abdomen. Passage of more gas than usual.  Walking can help get rid of the air that was put into your GI tract during the procedure and reduce the bloating. If you had a lower endoscopy (such as a colonoscopy or flexible sigmoidoscopy) you may notice spotting of blood in your stool or on the toilet paper. If you underwent a bowel prep for your procedure, then you may not have a normal bowel movement for a few days.  DIET: Your first meal following the procedure should be a light meal and then it is ok to progress to your normal diet.  A half-sandwich or bowl of soup is an example of a good first meal.  Heavy or fried foods are harder to digest and may make you feel nauseous or bloated.  Likewise meals heavy in dairy and vegetables can cause extra gas to form and this can also increase the bloating.  Drink plenty of fluids but you should avoid alcoholic beverages for 24 hours.  ACTIVITY: Your care partner should take you home directly after the procedure.  You should plan to take it easy, moving slowly for the rest of the day.  You can resume normal activity the day after the procedure however you should NOT DRIVE or use heavy machinery for 24 hours (because of the sedation medicines used during the test).    SYMPTOMS TO REPORT IMMEDIATELY: A gastroenterologist can be reached at any hour.  During normal business hours, 8:30 AM to 5:00 PM Monday through Friday,  call [phone].  After hours and on weekends, please call the GI answering service at [phone] who will take a message and have the physician on call contact you.   Following lower endoscopy (colonoscopy or flexible sigmoidoscopy):  Excessive amounts of blood in the stool  Significant tenderness or worsening of abdominal pains  Swelling of the abdomen that is new, acute  Fever of 100F or higher  FOLLOW UP:.  Our staff will call the home number listed on your records the next business day following your procedure to check on you and address any questions or concerns that you may have at that time regarding the information given to you following your procedure. This is a courtesy call and so if there is no answer at the home number and we have not heard from you through the emergency physician on call, we will assume that you have returned to your regular daily activities without incident.  SIGNATURES/CONFIDENTIALITY: You and/or your care partner have signed paperwork which will be entered into your electronic medical record.  These signatures attest to the fact that that the information above on your After Visit Summary has been reviewed and is understood.  Full responsibility of the confidentiality of this discharge information lies with you and/or your care-partner.   Continue your normal medications  Please read over information over hemorrhoids, diverticulosis, and high fiber diets  Please pick up your prescription at the  [facility]

## 2012-05-24 NOTE — Progress Notes (Signed)
NO EGG OR SOY ALLERGY. EWM 

## 2012-05-24 NOTE — Progress Notes (Signed)
Lidocaine-40mg IV prior to Propofol InductionPropofol given over incremental dosages 

## 2012-05-25 ENCOUNTER — Telehealth: Payer: Self-pay

## 2012-05-25 NOTE — Telephone Encounter (Signed)
  Follow up Call-  Call back number 05/24/2012  Post procedure Call Back phone  # [phone]  Permission to leave phone message Yes     Patient questions:  Do you have a fever, pain , or abdominal swelling? no Pain Score  0 *  Have you tolerated food without any problems? yes  Have you been able to return to your normal activities? yes  Do you have any questions about your discharge instructions: Diet   no Medications  no Follow up visit  no  Do you have questions or concerns about your Care? no  Actions: * If pain score is 4 or above: No action needed, pain <4.

## 2012-06-01 ENCOUNTER — Other Ambulatory Visit: Payer: Self-pay | Admitting: *Deleted

## 2012-06-02 ENCOUNTER — Ambulatory Visit (HOSPITAL_BASED_OUTPATIENT_CLINIC_OR_DEPARTMENT_OTHER): Payer: Medicare Other

## 2012-06-02 ENCOUNTER — Other Ambulatory Visit (HOSPITAL_BASED_OUTPATIENT_CLINIC_OR_DEPARTMENT_OTHER): Payer: Medicare Other | Admitting: Lab

## 2012-06-02 DIAGNOSIS — Z5112 Encounter for antineoplastic immunotherapy: Secondary | ICD-10-CM | POA: Diagnosis not present

## 2012-06-02 DIAGNOSIS — C50119 Malignant neoplasm of central portion of unspecified female breast: Secondary | ICD-10-CM | POA: Diagnosis not present

## 2012-06-02 DIAGNOSIS — C50419 Malignant neoplasm of upper-outer quadrant of unspecified female breast: Secondary | ICD-10-CM

## 2012-06-02 DIAGNOSIS — C50912 Malignant neoplasm of unspecified site of left female breast: Secondary | ICD-10-CM

## 2012-06-02 LAB — CBC WITH DIFFERENTIAL/PLATELET
BASO%: 0.8 % (ref 0.0–2.0)
EOS%: 1.2 % (ref 0.0–7.0)
HCT: 30.9 % — ABNORMAL LOW (ref 34.8–46.6)
LYMPH%: 33.1 % (ref 14.0–49.7)
MCH: 30.9 pg (ref 25.1–34.0)
MCHC: 33 g/dL (ref 31.5–36.0)
NEUT%: 55.5 % (ref 38.4–76.8)
Platelets: 174 10*3/uL (ref 145–400)

## 2012-06-02 LAB — COMPREHENSIVE METABOLIC PANEL (CC13)
AST: 24 U/L (ref 5–34)
Alkaline Phosphatase: 66 U/L (ref 40–150)
BUN: 27.6 mg/dL — ABNORMAL HIGH (ref 7.0–26.0)
Calcium: 9.3 mg/dL (ref 8.4–10.4)
Creatinine: 0.8 mg/dL (ref 0.6–1.1)

## 2012-06-02 MED ORDER — SODIUM CHLORIDE 0.9 % IV SOLN
Freq: Once | INTRAVENOUS | Status: AC
  Administered 2012-06-02: 12:00:00 via INTRAVENOUS

## 2012-06-02 MED ORDER — ACETAMINOPHEN 325 MG PO TABS
650.0000 mg | ORAL_TABLET | Freq: Once | ORAL | Status: AC
  Administered 2012-06-02: 650 mg via ORAL

## 2012-06-02 MED ORDER — TRASTUZUMAB CHEMO INJECTION 440 MG
6.0000 mg/kg | Freq: Once | INTRAVENOUS | Status: AC
  Administered 2012-06-02: 294 mg via INTRAVENOUS
  Filled 2012-06-02: qty 14

## 2012-06-02 MED ORDER — DIPHENHYDRAMINE HCL 25 MG PO CAPS
25.0000 mg | ORAL_CAPSULE | Freq: Once | ORAL | Status: AC
  Administered 2012-06-02: 25 mg via ORAL

## 2012-06-02 NOTE — Patient Instructions (Addendum)

## 2012-06-11 ENCOUNTER — Other Ambulatory Visit: Payer: Self-pay | Admitting: Oncology

## 2012-06-23 ENCOUNTER — Ambulatory Visit: Payer: Medicare Other

## 2012-06-23 ENCOUNTER — Other Ambulatory Visit: Payer: Medicare Other | Admitting: Lab

## 2012-06-26 ENCOUNTER — Encounter (HOSPITAL_COMMUNITY): Payer: Self-pay

## 2012-06-26 ENCOUNTER — Ambulatory Visit (HOSPITAL_BASED_OUTPATIENT_CLINIC_OR_DEPARTMENT_OTHER)
Admission: RE | Admit: 2012-06-26 | Discharge: 2012-06-26 | Disposition: A | Discharge status: Home / Self Care | Payer: Medicare Other | Source: Ambulatory Visit | Attending: Internal Medicine | Admitting: Internal Medicine

## 2012-06-26 ENCOUNTER — Ambulatory Visit (HOSPITAL_COMMUNITY)
Admission: RE | Admit: 2012-06-26 | Discharge: 2012-06-26 | Disposition: A | Discharge status: Home / Self Care | Payer: Medicare Other | Source: Ambulatory Visit | Attending: Internal Medicine | Admitting: Internal Medicine

## 2012-06-26 VITALS — BP 153/82 | HR 65 | Wt 106.8 lb

## 2012-06-26 DIAGNOSIS — C50419 Malignant neoplasm of upper-outer quadrant of unspecified female breast: Secondary | ICD-10-CM

## 2012-06-26 DIAGNOSIS — R030 Elevated blood-pressure reading, without diagnosis of hypertension: Secondary | ICD-10-CM

## 2012-06-26 DIAGNOSIS — M79609 Pain in unspecified limb: Secondary | ICD-10-CM

## 2012-06-26 DIAGNOSIS — M79662 Pain in left lower leg: Secondary | ICD-10-CM

## 2012-06-26 DIAGNOSIS — C50919 Malignant neoplasm of unspecified site of unspecified female breast: Secondary | ICD-10-CM | POA: Insufficient documentation

## 2012-06-26 DIAGNOSIS — C50411 Malignant neoplasm of upper-outer quadrant of right female breast: Secondary | ICD-10-CM

## 2012-06-26 DIAGNOSIS — Z09 Encounter for follow-up examination after completed treatment for conditions other than malignant neoplasm: Secondary | ICD-10-CM | POA: Diagnosis not present

## 2012-06-26 NOTE — Patient Instructions (Addendum)
Congratulations on finishing Herceptin.  Follow up PRN.

## 2012-06-26 NOTE — Progress Notes (Signed)
  Echocardiogram 2D Echocardiogram limited has been performed.  Ekin Pilar 06/26/2012, 11:16 AM

## 2012-06-26 NOTE — Progress Notes (Signed)
Dr. Darnelle Catalan PC: Dr. Mila Palmer  HPI: Kirsten Gibson is a 67 y.o. female with history of anxiety, anemia, and diagnosis of stage IIB invasive ductal carcinoma that is ER/PR and HER-2 positive on 05/04/11. A second area on right breast showed low-grade invasive ductal carcinoma that was ER/PR positive but HER-2 negative on 05/19/11. Left breast bx at the time showed low grade invasive ductal carcinoma, e-cadherin positive and Her-2 negative. She completed 6 cycles of neoadjuvant therapy with carboplatin/ docetaxel/ trastuzumab on 09/29/2011. S/p bilateral mastectomies at [facility] October 2013. Completed radiation to right chest wall and right supraclavicular fossa 02/2012. She had 3 cycles of pertuzumab that was discontinued on 01/28/12. She was restarted on trastuzumab 02/18/12 with plans for completion 05/2012.   Follow up: Since last visit has completed Herceptin. Reports has her energy back. BP in L leg in April 160/100. Noticing that L leg seems weak and she is having some cramping. Also notices that leg is bigger than R. Denies any SOB, CP, or orthopnea.   Echo today (reviewed personally) EF 60-65% lat s' 11.3 (stable)  Diastolic function normal.   Doppler BP checked personally: L& R Leg 170                                                      L arm 150 BP at home 113/50 by wrist cuff   ROS: All systems negative except as listed in HPI, PMH and Problem List.  Past Medical History  Diagnosis Date  . Loose stools   . Frequency   . Anxiety   . TMJ click   . S/P chemotherapy, time since 4-12 weeks   . Rosacea   . Anemia due to chemotherapy 08/23/2011  . Hx of radiation therapy 01/11/12- 02/28/12    right chest wall/supraclav fossa/drain site/scar boost  . Breast cancer     bilateral infiltrating ductal carcinoma  . Mycosis fungoides     follows up with [facility] once to twice per year     Current Outpatient Prescriptions  Medication Sig Dispense Refill  . calcium carbonate (OS-CAL) 600  MG TABS Take 600 mg by mouth 3 (three) times daily with meals.      . Cholecalciferol (VITAMIN D3 PO) Take 3,000 mg by mouth daily.       Tery Sanfilippo Calcium (STOOL SOFTENER PO) Take 50 mg by mouth 2 (two) times daily.      [name] DOXYLAMINE SUCCINATE, SLEEP, PO Take 15 mg by mouth at bedtime.      . hydrocortisone (ANUSOL-HC) 25 MG suppository Place 1 suppository (25 mg total) rectally at bedtime as needed for hemorrhoids.  12 suppository  1  . Multiple Vitamins-Minerals (MULTIVITAMIN PO) Take 1 capsule by mouth daily.      . Omega 3-6-9 Fatty Acids (OMEGA-3 & OMEGA-6 FISH OIL PO) Take 720 mg by mouth daily.      . Omega-3 Fatty Acids (OMEGA 3 PO) Take 2,200 mg by mouth daily.      [name] POLY-IRON 150 150 MG capsule TAKE ONE CAPSULE BY MOUTH TWICE DAILY  180 capsule  1  . tamoxifen (NOLVADEX) 20 MG tablet Take 1 tablet (20 mg total) by mouth daily.  90 tablet  4  . temazepam (RESTORIL) 7.5 MG capsule 1-2 tabs by mouth at bedtime as needed for sleep  180  capsule  0   No current facility-administered medications for this encounter.     PHYSICAL EXAM: Filed Vitals:   06/26/12 1111  BP: 153/82  Pulse: 65  Weight: 106 lb 12.8 oz (48.444 kg)  SpO2: 100%    General:  Well appearing. No resp difficulty; husband present HEENT: normal Neck: supple. JVP flat. Carotids 2+ bilaterally; no bruits. No lymphadenopathy or thryomegaly appreciated. Cor: PMI normal. Regular rate & rhythm. No rubs, gallops or murmurs. Lungs: clear Abdomen: soft, nontender, nondistended. No hepatosplenomegaly. No bruits or masses. Good bowel sounds. Extremities: no cyanosis, clubbing, rash, edema; +DP pulses bilaterally Neuro: alert & orientedx3, cranial nerves grossly intact. Moves all 4 extremities w/o difficulty. Affect pleasant.    ASSESSMENT & PLAN:

## 2012-06-26 NOTE — Assessment & Plan Note (Signed)
BP checked with Doppler in BLE and LUE. In LE dopplerable pulse at 170. In LUE dopplerable pulse at 150+. ABI ~1.2 which is normal. BP at home with wrist cuff is fine 95-110. Suspect just white coat HTN.

## 2012-06-26 NOTE — Assessment & Plan Note (Signed)
I reviewed echos personally. EF and Doppler parameters stable. No HF on exam. Has completed Herceptin. F/u PRN.

## 2012-06-26 NOTE — Assessment & Plan Note (Addendum)
Arterial pulses are good. No evidence of claudication. No evidence DVT on exam. Suspect just muscular cramping. Electrolytes ok. Suspect just cramping. Reassured her. We told her she can get u/s if calf swelling of symptoms recur.

## 2012-06-27 ENCOUNTER — Other Ambulatory Visit: Payer: Self-pay | Admitting: Physician Assistant

## 2012-06-27 DIAGNOSIS — C50411 Malignant neoplasm of upper-outer quadrant of right female breast: Secondary | ICD-10-CM

## 2012-06-28 ENCOUNTER — Other Ambulatory Visit (HOSPITAL_BASED_OUTPATIENT_CLINIC_OR_DEPARTMENT_OTHER): Payer: Medicare Other | Admitting: Lab

## 2012-06-28 DIAGNOSIS — C50419 Malignant neoplasm of upper-outer quadrant of unspecified female breast: Secondary | ICD-10-CM | POA: Diagnosis not present

## 2012-06-28 DIAGNOSIS — C50411 Malignant neoplasm of upper-outer quadrant of right female breast: Secondary | ICD-10-CM

## 2012-06-28 LAB — CBC WITH DIFFERENTIAL/PLATELET
Basophils Absolute: 0.1 10*3/uL (ref 0.0–0.1)
Eosinophils Absolute: 0.1 10*3/uL (ref 0.0–0.5)
HCT: 31.1 % — ABNORMAL LOW (ref 34.8–46.6)
HGB: 10.6 g/dL — ABNORMAL LOW (ref 11.6–15.9)
MCV: 95.4 fL (ref 79.5–101.0)
MONO%: 12 % (ref 0.0–14.0)
NEUT#: 2.2 10*3/uL (ref 1.5–6.5)
NEUT%: 52.8 % (ref 38.4–76.8)
Platelets: 165 10*3/uL (ref 145–400)
RDW: 13.7 % (ref 11.2–14.5)

## 2012-06-28 LAB — COMPREHENSIVE METABOLIC PANEL (CC13)
Albumin: 3.4 g/dL — ABNORMAL LOW (ref 3.5–5.0)
Alkaline Phosphatase: 52 U/L (ref 40–150)
BUN: 23.1 mg/dL (ref 7.0–26.0)
Calcium: 8.9 mg/dL (ref 8.4–10.4)
Chloride: 104 mEq/L (ref 98–107)
Glucose: 68 mg/dl — ABNORMAL LOW (ref 70–99)
Potassium: 3.8 mEq/L (ref 3.5–5.1)

## 2012-07-06 ENCOUNTER — Ambulatory Visit (HOSPITAL_BASED_OUTPATIENT_CLINIC_OR_DEPARTMENT_OTHER): Payer: Medicare Other | Admitting: Oncology

## 2012-07-06 ENCOUNTER — Telehealth: Payer: Self-pay | Admitting: Oncology

## 2012-07-06 VITALS — BP 132/78 | HR 57 | Temp 98.4°F | Resp 20 | Ht 60.0 in | Wt 106.0 lb

## 2012-07-06 DIAGNOSIS — C50919 Malignant neoplasm of unspecified site of unspecified female breast: Secondary | ICD-10-CM

## 2012-07-06 DIAGNOSIS — C50419 Malignant neoplasm of upper-outer quadrant of unspecified female breast: Secondary | ICD-10-CM | POA: Diagnosis not present

## 2012-07-06 DIAGNOSIS — C50411 Malignant neoplasm of upper-outer quadrant of right female breast: Secondary | ICD-10-CM

## 2012-07-06 DIAGNOSIS — C50912 Malignant neoplasm of unspecified site of left female breast: Secondary | ICD-10-CM

## 2012-07-06 MED ORDER — GABAPENTIN 100 MG PO CAPS: 100.0000 mg | ORAL_CAPSULE | Freq: Every day | ORAL | Status: DC

## 2012-07-06 NOTE — Progress Notes (Signed)
ID: KEILANI TERRANCE   DOB: 04/09/1945  MR#: [id number]  BJY#:[id number]  PCP: Emeterio Reeve, MD GYN:  SU: Cicero Duck, Malvin Johns Lenis Noon OTHER YQ:[id number] Demaris Callander [name]   HISTORY OF PRESENT ILLNESS: She palpated a right breast mass March 2013 and presented for a mammogram. This confirmed the presence of a 1.9 cm mass in the upper outer quadrants. And adjacent mass was noted which was possibly thought to represent a intramammary lymph node. A right axillary lymph node was also noted to be enlarged. Biopsy of the upper outer quadrant mass showed a grade 2 invasive ductal carcinoma which was ER/PR positive HER-2 positive. Ki 67 was 12%. An MRI of the breast was performed 05/10/2011 which showed an overall area of the right breast measuring 5.0 x 2.6 x 2.2 cm of clumped linear enhancement. Majority of this was felt to represent DCIS. A small area of clumped enhancement was noted in the left breast which was not seen on the mammogram. A biopsy of this area was recommended. A biopsy of the right axillary lymph node was also positive for invasive ductal carcinoma. The patient's subsequent history is as detailed below  INTERVAL HISTORY: Jeronimo Norma returns today with her husband Leonette Most for followup of her breast cancer. Since her last visit here she has been started on tamoxifen. She also completed her year of trastuzumab and had her final echocardiogram.  REVIEW OF SYSTEMS: She is tolerating the tamoxifen well, except for "night sweats", which aren't new, occur pretty much every night, and do interrupt her sleep. She is worried that the lack of estrogen from tamoxifen is going to make her face full of wrinkles--I should say that Neriyah laughs at some of her on worries, including this one, even as she expresses them.-- She saw Dr. Gala Romney and described some leg cramps she was having to him. He obtained some leg pressures which seemed elevated to her, but basically has no tells me  she has very good peripheral pulses no evidence of venous insufficiency and he feels that her cramps are probably just cramps. She has some neuropathy in her feet and wonders if that can cause swelling in itself. Otherwise a detailed review of systems today was noncontributory  PAST MEDICAL HISTORY: Past Medical History  Diagnosis Date  . Loose stools   . Frequency   . Anxiety   . TMJ click   . S/P chemotherapy, time since 4-12 weeks   . Rosacea   . Anemia due to chemotherapy 08/23/2011  . Hx of radiation therapy 01/11/12- 02/28/12    right chest wall/supraclav fossa/drain site/scar boost  . Breast cancer     bilateral infiltrating ductal carcinoma  . Mycosis fungoides     follows up with [facility] once to twice per year     PAST SURGICAL HISTORY: Past Surgical History  Procedure Laterality Date  . Cholecystectomy    . Benign tumor removed from neck  1974  . Rt breast biobsy    . Breast biopsy  05/19/11    left  and right breast  . Portacath placement  05/21/2011    Procedure: INSERTION PORT-A-CATH;  Surgeon: Currie Paris, MD;  Location: [facility];  Service: General;  Laterality: Left;  . Abdominal hysterectomy  1988    partial with on ovary remaining.  [name] Biopsy breast      right breast biopsy   . Mastectomy  11/2011    [facility]at, [facility]  . Port-a-cath removal Left 04/06/2012  Procedure: MINOR REMOVAL PORT-A-CATH;  Surgeon: Currie Paris, MD;  Location: [facility];  Service: General;  Laterality: Left;    FAMILY HISTORY Family History  Problem Relation Age of Onset  . Cancer Maternal Aunt     breast to brain  . Cancer Paternal Grandmother     breast and/or ovarian///unknown  . Cancer Paternal Grandfather     stomach  . Colon cancer Neg Hx    the patient's father is still living, currently 67 years old. The patient's mother died at the age of 63 from complications of diabetes. The patient had no brothers, 3 sisters. One of the patient's mother  is 2 sisters was diagnosed with breast cancer in her late 22s. There is no other history of breast or ovarian cancer in the family. The patient tells me she was evaluated for genetic testing at [facility], but that the insurance would not approve of the test.  GYNECOLOGIC HISTORY: Menarche age 47, she is GX P0. She underwent hysterectomy and unilateral salpingo-oophorectomy in 1988. She took hormone replacement for approximately 3 years.  SOCIAL HISTORY: She used to work at [employer], but is now retired. Her husband of 21 years, Leonette Most, used to work for the [employer] He has a son from a prior marriage, and 2 grandchildren. The patient attends [address]   ADVANCED DIRECTIVES: Not in place  HEALTH MAINTENANCE: History  Substance Use Topics  . Smoking status: Former Smoker -- 1.00 packs/day for 20 years    Types: Cigarettes    Quit date: 02/08/1986  . Smokeless tobacco: Never Used  . Alcohol Use: No     Colonoscopy:  Scheduled for April 2014, [place]  PAP:  Bone density: 02/23/2012, osteopenia  Lipid panel:  Allergies  Allergen Reactions  . Latex Dermatitis and Rash  . Adhesive (Tape)     ALLERGIC TO OP-SITE......USE OP-SITE FLEXIGRID INSTEAD !!!  . Zofran (Ondansetron Hcl) Swelling    Swelling&redness to face!!  . Diclofenac Other (See Comments)    Fever, chills  . Naproxen Other (See Comments)    Fever, chills    Current Outpatient Prescriptions  Medication Sig Dispense Refill  . calcium carbonate (OS-CAL) 600 MG TABS Take 600 mg by mouth 3 (three) times daily with meals.      . Cholecalciferol (VITAMIN D3 PO) Take 3,000 mg by mouth daily.       Tery Sanfilippo Calcium (STOOL SOFTENER PO) Take 50 mg by mouth 2 (two) times daily.      [name] DOXYLAMINE SUCCINATE, SLEEP, PO Take 15 mg by mouth at bedtime.      . hydrocortisone (ANUSOL-HC) 25 MG suppository Place 1 suppository (25 mg total) rectally at bedtime as  needed for hemorrhoids.  12 suppository  1  . Multiple Vitamins-Minerals (MULTIVITAMIN PO) Take 1 capsule by mouth daily.      . Omega 3-6-9 Fatty Acids (OMEGA-3 & OMEGA-6 FISH OIL PO) Take 720 mg by mouth daily.      . Omega-3 Fatty Acids (OMEGA 3 PO) Take 2,200 mg by mouth daily.      [name] POLY-IRON 150 150 MG capsule TAKE ONE CAPSULE BY MOUTH TWICE DAILY  180 capsule  1  . tamoxifen (NOLVADEX) 20 MG tablet Take 1 tablet (20 mg total) by mouth daily.  90 tablet  4  . temazepam (RESTORIL) 7.5 MG capsule 1-2 tabs by mouth at bedtime as needed for sleep  180 capsule  0   No  current facility-administered medications for this visit.    OBJECTIVE: Middle-aged white woman  In no acute distress  Filed Vitals:   07/06/12 1603  BP: 132/78  Pulse: 57  Temp: 98.4 F (36.9 C)  Resp: 20     Body mass index is 20.7 kg/(m^2).    ECOG FS: 0 Filed Weights   07/06/12 1603  Weight: 106 lb (48.081 kg)   Sclerae unicteric Oropharynx clear No cervical or supraclavicular adenopathy Lungs are clear bilaterally with good excursion Heart regular rate and rhythm Abdomen soft, nontender, positive bowel sounds MSK no focal spinal tenderness, no peripheral edema including no ankle or obvious calf edema Neuro: nonfocal, well oriented Breasts: Status post bilateral mastectomies. There is no evidence of local recurrence. Axillae are benign bilaterally   LAB RESULTS: Lab Results  Component Value Date   WBC 4.2 06/28/2012   NEUTROABS 2.2 06/28/2012   HGB 10.6* 06/28/2012   HCT 31.1* 06/28/2012   MCV 95.4 06/28/2012   PLT 165 06/28/2012      Chemistry      Component Value Date/Time   NA 141 06/28/2012 0950   NA 139 01/28/2012 1636   K 3.8 06/28/2012 0950   K 3.5 01/28/2012 1636   CL 104 06/28/2012 0950   CL 102 01/28/2012 1636   CO2 30* 06/28/2012 0950   CO2 27 01/28/2012 1636   BUN 23.1 06/28/2012 0950   BUN 29* 01/28/2012 1636   CREATININE 0.9 06/28/2012 0950   CREATININE 1.01 01/28/2012 1636       Component Value Date/Time   CALCIUM 8.9 06/28/2012 0950   CALCIUM 9.2 01/28/2012 1636   ALKPHOS 52 06/28/2012 0950   ALKPHOS 84 01/28/2012 1636   AST 22 06/28/2012 0950   AST 23 01/28/2012 1636   ALT 19 06/28/2012 0950   ALT 20 01/28/2012 1636   BILITOT 0.25 06/28/2012 0950   BILITOT 0.2* 01/28/2012 1636       Lab Results  Component Value Date   LABCA2 21 02/18/2012     STUDIES:  echocardiogram 06/26/2012 shows a well preserved ejection fraction   ASSESSMENT: 67 y.o. [place] woman  (1) status post right breast and axillary lymph node biopsy 05/04/2011, both positive for a grade 2 invasive ductal carcinoma, cT2 pN1 or stage IIB,estrogen receptor 82% and progesterone receptor 92% positive, with an MIB-1 of 12%, and HER-2 amplification by CISH with a ratio of 2.86  (2) biopsy 05/19/2011 of a second right breast area showed low-grade invasive ductal carcinoma, estrogen and progesterone receptor both 100% positive, with an MIB-1 of 13%, and no HER-2 amplification  (3) left breast biopsy 05/19/2011 showed a low-grade invasive ductal carcinoma, e-cadherin positive, HER-2 negative  (4) neoadjuvant treatment consisted of carboplatin/ docetaxel/ trastuzumab x6, started 06/02/2011 and completed 09/29/2011  (5) status post bilateral mastectomies at [facility] October 2013 showing  (a) on the right, a residual 1.2 cm invasive ductal carcinoma in the breast, with 3 of 19 lymph nodes involved; an additional lymph node showed only isolated tumor cells, so this is a ypT1c ypN1 result  (b) on the left, there was a 1 mm area of residual invasive ductal carcinoma, with all 5 sentinel lymph nodes negative  (6) the patient completed postmastectomy radiation to the right chest wall and right supraclavicular fossa 02/28/2012  (7) status post-op trastuzumab/ pertuzumab x3, discontinued 01/28/2012  (8) trastuzumab continued 02/18/2012, completed April 2014. Most recent echocardiogram 06/26/2012  showed a well preserved ejection fraction.  (9) tamoxifen started 03/25/2012  PLAN:  I think Niko is tolerating tamoxifen well. We went over the fact that tamoxifen does not lower the estrogen level. The possible slight increase in wrinkles she has noted in her face may be due to her having  lost some weight. Also I don't think the cramps are going to be related to tamoxifen. More commonly there due to dehydration. We talked about stretching slowly and holding distress as opposed to doing breast stretching movements.  They night sweats other than probably are due to the tamoxifen. I wrote her a prescription for gabapentin 100 mg to take at bedtime. If this is not effective she will let us know and we will up the dose.  She is beginning to consider reconstruction. I suggested she bring this up with Dr. Lenis Noon at her next visit with him. Otherwise she will see [place] again in August, and then again in February of 2015. The plan at this point is to continue tamoxifen 2 years and then decide whether or not she wants to switch to an aromatase inhibitor. She knows to call for any problems that may develop before the next visit. MAGRINAT,GUSTAV C    07/06/2012

## 2012-07-06 NOTE — Telephone Encounter (Signed)
gv pt appt schedule for August 2014 and February 2015.

## 2012-07-10 ENCOUNTER — Encounter: Payer: Self-pay | Admitting: Oncology

## 2012-07-10 NOTE — Progress Notes (Signed)
Patient came in with bill from 02/07/12 $3092.29 to see if [employer] was filed and paid. I sent for review.

## 2012-07-14 ENCOUNTER — Ambulatory Visit: Payer: Medicare Other

## 2012-07-14 ENCOUNTER — Other Ambulatory Visit: Payer: Medicare Other | Admitting: Lab

## 2012-07-18 DIAGNOSIS — E7800 Pure hypercholesterolemia, unspecified: Secondary | ICD-10-CM | POA: Diagnosis not present

## 2012-07-18 DIAGNOSIS — R209 Unspecified disturbances of skin sensation: Secondary | ICD-10-CM | POA: Diagnosis not present

## 2012-07-18 DIAGNOSIS — D649 Anemia, unspecified: Secondary | ICD-10-CM | POA: Diagnosis not present

## 2012-07-18 DIAGNOSIS — Z79899 Other long term (current) drug therapy: Secondary | ICD-10-CM | POA: Diagnosis not present

## 2012-07-18 DIAGNOSIS — B351 Tinea unguium: Secondary | ICD-10-CM | POA: Diagnosis not present

## 2012-07-18 DIAGNOSIS — M899 Disorder of bone, unspecified: Secondary | ICD-10-CM | POA: Diagnosis not present

## 2012-07-18 DIAGNOSIS — Z0000 Encounter for general adult medical examination without abnormal findings: Secondary | ICD-10-CM | POA: Diagnosis not present

## 2012-08-30 ENCOUNTER — Other Ambulatory Visit: Payer: Self-pay | Admitting: *Deleted

## 2012-08-30 DIAGNOSIS — C50419 Malignant neoplasm of upper-outer quadrant of unspecified female breast: Secondary | ICD-10-CM

## 2012-08-30 MED ORDER — TEMAZEPAM 7.5 MG PO CAPS: ORAL_CAPSULE | ORAL | Status: DC

## 2012-09-20 ENCOUNTER — Other Ambulatory Visit: Payer: Self-pay | Admitting: Physician Assistant

## 2012-09-20 DIAGNOSIS — C50912 Malignant neoplasm of unspecified site of left female breast: Secondary | ICD-10-CM

## 2012-09-20 DIAGNOSIS — C50411 Malignant neoplasm of upper-outer quadrant of right female breast: Secondary | ICD-10-CM

## 2012-09-21 ENCOUNTER — Other Ambulatory Visit: Payer: Self-pay | Admitting: Physician Assistant

## 2012-09-21 ENCOUNTER — Other Ambulatory Visit (HOSPITAL_BASED_OUTPATIENT_CLINIC_OR_DEPARTMENT_OTHER): Payer: Medicare Other | Admitting: Lab

## 2012-09-21 DIAGNOSIS — D649 Anemia, unspecified: Secondary | ICD-10-CM | POA: Diagnosis not present

## 2012-09-21 DIAGNOSIS — C50411 Malignant neoplasm of upper-outer quadrant of right female breast: Secondary | ICD-10-CM

## 2012-09-21 DIAGNOSIS — C50419 Malignant neoplasm of upper-outer quadrant of unspecified female breast: Secondary | ICD-10-CM

## 2012-09-21 DIAGNOSIS — C50912 Malignant neoplasm of unspecified site of left female breast: Secondary | ICD-10-CM

## 2012-09-21 LAB — COMPREHENSIVE METABOLIC PANEL (CC13)
Albumin: 3.3 g/dL — ABNORMAL LOW (ref 3.5–5.0)
BUN: 18.5 mg/dL (ref 7.0–26.0)
Calcium: 8.9 mg/dL (ref 8.4–10.4)
Chloride: 103 mEq/L (ref 98–109)
Glucose: 105 mg/dl (ref 70–140)
Potassium: 3.5 mEq/L (ref 3.5–5.1)

## 2012-09-21 LAB — CBC WITH DIFFERENTIAL/PLATELET
Basophils Absolute: 0 10*3/uL (ref 0.0–0.1)
Eosinophils Absolute: 0.1 10*3/uL (ref 0.0–0.5)
HGB: 10.8 g/dL — ABNORMAL LOW (ref 11.6–15.9)
NEUT#: 2 10*3/uL (ref 1.5–6.5)
RDW: 12.8 % (ref 11.2–14.5)
lymph#: 1.7 10*3/uL (ref 0.9–3.3)

## 2012-09-21 LAB — FERRITIN CHCC: Ferritin: 78 ng/ml (ref 9–269)

## 2012-09-28 ENCOUNTER — Ambulatory Visit (HOSPITAL_BASED_OUTPATIENT_CLINIC_OR_DEPARTMENT_OTHER): Payer: Medicare Other | Admitting: Physician Assistant

## 2012-09-28 ENCOUNTER — Telehealth: Payer: Self-pay

## 2012-09-28 ENCOUNTER — Encounter: Payer: Self-pay | Admitting: Physician Assistant

## 2012-09-28 ENCOUNTER — Ambulatory Visit (HOSPITAL_COMMUNITY)
Admission: RE | Admit: 2012-09-28 | Discharge: 2012-09-28 | Disposition: A | Discharge status: Home / Self Care | Payer: Medicare Other | Source: Ambulatory Visit | Attending: Physician Assistant | Admitting: Physician Assistant

## 2012-09-28 ENCOUNTER — Telehealth: Payer: Self-pay | Admitting: Oncology

## 2012-09-28 VITALS — BP 127/81 | HR 78 | Temp 96.6°F | Resp 19 | Wt 110.0 lb

## 2012-09-28 DIAGNOSIS — C50411 Malignant neoplasm of upper-outer quadrant of right female breast: Secondary | ICD-10-CM

## 2012-09-28 DIAGNOSIS — C50912 Malignant neoplasm of unspecified site of left female breast: Secondary | ICD-10-CM

## 2012-09-28 DIAGNOSIS — R059 Cough, unspecified: Secondary | ICD-10-CM | POA: Diagnosis not present

## 2012-09-28 DIAGNOSIS — D649 Anemia, unspecified: Secondary | ICD-10-CM | POA: Insufficient documentation

## 2012-09-28 DIAGNOSIS — R05 Cough: Secondary | ICD-10-CM

## 2012-09-28 DIAGNOSIS — M4130 Thoracogenic scoliosis, site unspecified: Secondary | ICD-10-CM | POA: Diagnosis not present

## 2012-09-28 DIAGNOSIS — C50419 Malignant neoplasm of upper-outer quadrant of unspecified female breast: Secondary | ICD-10-CM | POA: Insufficient documentation

## 2012-09-28 DIAGNOSIS — Z853 Personal history of malignant neoplasm of breast: Secondary | ICD-10-CM | POA: Diagnosis not present

## 2012-09-28 DIAGNOSIS — C50919 Malignant neoplasm of unspecified site of unspecified female breast: Secondary | ICD-10-CM | POA: Diagnosis not present

## 2012-09-28 NOTE — Progress Notes (Signed)
ID: Kirsten Gibson   DOB: 1945/04/06  MR#: [id number]  WUJ#:[id number]  PCP: Emeterio Reeve, MD GYN:  SU: Cicero Duck, Malvin Johns Lenis Noon OTHER NF:AOZHY Demaris Callander [name]   HISTORY OF PRESENT ILLNESS: She palpated a right breast mass March 2013 and presented for a mammogram. This confirmed the presence of a 1.9 cm mass in the upper outer quadrants. And adjacent mass was noted which was possibly thought to represent a intramammary lymph node. A right axillary lymph node was also noted to be enlarged. Biopsy of the upper outer quadrant mass showed a grade 2 invasive ductal carcinoma which was ER/PR positive HER-2 positive. Ki 67 was 12%. An MRI of the breast was performed 05/10/2011 which showed an overall area of the right breast measuring 5.0 x 2.6 x 2.2 cm of clumped linear enhancement. Majority of this was felt to represent DCIS. A small area of clumped enhancement was noted in the left breast which was not seen on the mammogram. A biopsy of this area was recommended. A biopsy of the right axillary lymph node was also positive for invasive ductal carcinoma. The patient's subsequent history is as detailed below  INTERVAL HISTORY: Kirsten Gibson returns alone today for followup of her bilateral breast cances. Interval history is generally unremarkable. She has had a good summer, and I have taken a couple of trips. The family is doing well.  Kirsten Gibson herself is feeling well, but continues to have some general anxiety about her breast cancer. She "worries about every little bump" and had lots of questions today. She continues on tamoxifen which she is tolerating well.   Kirsten Gibson does tell me that she has been seeing several dentists over the past year and was recently diagnosed with a "fistula in the gums that was infected". She had a procedure last week to clear this area, and is already feeling better. She's had no fevers or chills. She denies any jaw pain.  REVIEW OF SYSTEMS: Kirsten Gibson  has had no skin changes or rashes. She does have some hot flashes. She denies any abnormal bruising or bleeding. Her appetite is great and she denies any nausea or change in bowel or bladder habits. She has had an increased cough for the last several months, occasionally productive of clear phlegm. She denies any increased shortness of breath and has had no chest pain or palpitations. She denies any abnormal headaches or dizziness. She also denies any unusual pain, specifically no myalgias, arthralgias, or bony pain. She's had no peripheral swelling.  A detailed review of systems is otherwise noncontributory.  PAST MEDICAL HISTORY: Past Medical History  Diagnosis Date  . Loose stools   . Frequency   . Anxiety   . TMJ click   . S/P chemotherapy, time since 4-12 weeks   . Rosacea   . Anemia due to chemotherapy 08/23/2011  . Hx of radiation therapy 01/11/12- 02/28/12    right chest wall/supraclav fossa/drain site/scar boost  . Breast cancer     bilateral infiltrating ductal carcinoma  . Mycosis fungoides     follows up with [facility] once to twice per year     PAST SURGICAL HISTORY: Past Surgical History  Procedure Laterality Date  . Cholecystectomy    . Benign tumor removed from neck  1974  . Rt breast biobsy    . Breast biopsy  05/19/11    left  and right breast  . Portacath placement  05/21/2011    Procedure: INSERTION PORT-A-CATH;  Surgeon: Ephriam Knuckles  Leta Jungling, MD;  Location: [facility];  Service: General;  Laterality: Left;  . Abdominal hysterectomy  1988    partial with on ovary remaining.  [name] Biopsy breast      right breast biopsy   . Mastectomy  11/2011    bilat, [facility]  . Port-a-cath removal Left 04/06/2012    Procedure: MINOR REMOVAL PORT-A-CATH;  Surgeon: Currie Paris, MD;  Location: [facility];  Service: General;  Laterality: Left;    FAMILY HISTORY Family History  Problem Relation Age of Onset  . Cancer Maternal Aunt     breast to brain  .  Cancer Paternal Grandmother     breast and/or ovarian///unknown  . Cancer Paternal Grandfather     stomach  . Colon cancer Neg Hx    the patient's father is still living, currently 68 years old. The patient's mother died at the age of 15 from complications of diabetes. The patient had no brothers, 3 sisters. One of the patient's mother is 2 sisters was diagnosed with breast cancer in her late 67s. There is no other history of breast or ovarian cancer in the family. The patient tells me she was evaluated for genetic testing at [facility], but that the insurance would not approve of the test.  GYNECOLOGIC HISTORY: Menarche age 61, she is GX P0. She underwent hysterectomy and unilateral salpingo-oophorectomy in 1988. She took hormone replacement for approximately 3 years.  SOCIAL HISTORY: She used to work at [employer], but is now retired. Her husband of 21 years, Leonette Most, used to work for the [employer] He has a son from a prior marriage, and 2 grandchildren. The patient attends [address]   ADVANCED DIRECTIVES: Not in place  HEALTH MAINTENANCE: History  Substance Use Topics  . Smoking status: Former Smoker -- 1.00 packs/day for 20 years    Types: Cigarettes    Quit date: 02/08/1986  . Smokeless tobacco: Never Used  . Alcohol Use: No     Colonoscopy:  April 2014, [facility]  PAP:  Bone density: 02/23/2012, osteopenia, [facility]  Lipid panel:  Allergies  Allergen Reactions  . Latex Dermatitis and Rash  . Adhesive [Tape]     ALLERGIC TO OP-SITE......USE OP-SITE FLEXIGRID INSTEAD !!!  . Zofran [Ondansetron Hcl] Swelling    Swelling&redness to face!!  . Diclofenac Other (See Comments)    Fever, chills  . Naproxen Other (See Comments)    Fever, chills    Current Outpatient Prescriptions  Medication Sig Dispense Refill  . calcium carbonate (OS-CAL) 600 MG TABS Take 600 mg by mouth 3 (three) times daily with meals.      .  Cholecalciferol (VITAMIN D3 PO) Take 3,000 mg by mouth daily.       Kirsten Sanfilippo Calcium (STOOL SOFTENER PO) Take 50 mg by mouth 2 (two) times daily.      [name] DOXYLAMINE SUCCINATE, SLEEP, PO Take 15 mg by mouth at bedtime.      . gabapentin (NEURONTIN) 100 MG capsule Take 1 capsule (100 mg total) by mouth daily.  30 capsule  6  . hydrocortisone (ANUSOL-HC) 25 MG suppository Place 1 suppository (25 mg total) rectally at bedtime as needed for hemorrhoids.  12 suppository  1  . Multiple Vitamins-Minerals (MULTIVITAMIN PO) Take 1 capsule by mouth daily.      . Omega 3-6-9 Fatty Acids (OMEGA-3 & OMEGA-6 FISH OIL PO) Take 720 mg by mouth daily.      [name]  Omega-3 Fatty Acids (OMEGA 3 PO) Take 2,200 mg by mouth daily.      [name] POLY-IRON 150 150 MG capsule TAKE ONE CAPSULE BY MOUTH TWICE DAILY  180 capsule  1  . tamoxifen (NOLVADEX) 20 MG tablet Take 1 tablet (20 mg total) by mouth daily.  90 tablet  4  . temazepam (RESTORIL) 7.5 MG capsule 1-2 tabs by mouth at bedtime as needed for sleep  180 capsule  0   No current facility-administered medications for this visit.    OBJECTIVE: Middle-aged white woman  In no acute distress  Filed Vitals:   09/28/12 1348  BP: 127/81  Pulse: 78  Temp: 96.6 F (35.9 C)  Resp: 19     Body mass index is 21.48 kg/(m^2).    ECOG FS: 0 Filed Weights   09/28/12 1348  Weight: 110 lb (49.896 kg)   Sclerae unicteric Oropharynx clear No cervical or supraclavicular adenopathy Lungs are clear bilaterally with good excursion, no wheezes or rhonchi Heart regular rate and rhythm, no murmur appreciated Abdomen soft, thin, nontender, positive bowel sounds MSK no focal spinal tenderness, no peripheral edema, and specifically no lymphedema in the upper extremities Neuro: nonfocal, well oriented, anxious affect Breasts: Status post bilateral mastectomies. There is no evidence of local recurrence. Axillae are benign bilaterally.    LAB RESULTS: Lab Results  Component Value Date    WBC 4.2 09/21/2012   NEUTROABS 2.0 09/21/2012   HGB 10.8* 09/21/2012   HCT 32.9* 09/21/2012   MCV 95.6 09/21/2012   PLT 178 09/21/2012      Chemistry      Component Value Date/Time   NA 142 09/21/2012 1401   NA 139 01/28/2012 1636   K 3.5 09/21/2012 1401   K 3.5 01/28/2012 1636   CL 104 06/28/2012 0950   CL 102 01/28/2012 1636   CO2 29 09/21/2012 1401   CO2 27 01/28/2012 1636   BUN 18.5 09/21/2012 1401   BUN 29* 01/28/2012 1636   CREATININE 1.1 09/21/2012 1401   CREATININE 1.01 01/28/2012 1636      Component Value Date/Time   CALCIUM 8.9 09/21/2012 1401   CALCIUM 9.2 01/28/2012 1636   ALKPHOS 52 09/21/2012 1401   ALKPHOS 84 01/28/2012 1636   AST 25 09/21/2012 1401   AST 23 01/28/2012 1636   ALT 17 09/21/2012 1401   ALT 20 01/28/2012 1636   BILITOT 0.23 09/21/2012 1401   BILITOT 0.2* 01/28/2012 1636     FERRITIN  78  09/21/2012  Lab Results  Component Value Date   LABCA2 21 02/18/2012     STUDIES:  echocardiogram 06/26/2012 shows a well preserved ejection fraction  Bone scan at [facility] on 03/13/2012 showed osteopenia.   ASSESSMENT: 67 y.o. [place] woman  (1) status post right breast and axillary lymph node biopsy 05/04/2011, both positive for a grade 2 invasive ductal carcinoma, cT2 pN1 or stage IIB,estrogen receptor 82% and progesterone receptor 92% positive, with an MIB-1 of 12%, and HER-2 amplification by CISH with a ratio of 2.86  (2) biopsy 05/19/2011 of a second right breast area showed low-grade invasive ductal carcinoma, estrogen and progesterone receptor both 100% positive, with an MIB-1 of 13%, and no HER-2 amplification  (3) left breast biopsy 05/19/2011 showed a low-grade invasive ductal carcinoma, e-cadherin positive, HER-2 negative  (4) neoadjuvant treatment consisted of carboplatin/ docetaxel/ trastuzumab x6, started 06/02/2011 and completed 09/29/2011  (5) status post bilateral mastectomies [facility] October 2013 showing  (a) on the right, a residual  1.2 cm invasive ductal carcinoma in the breast, with 3 of 19 lymph nodes involved; an additional lymph node showed only isolated tumor cells, so this is a ypT1c ypN1 result  (b) on the left, there was a 1 mm area of residual invasive ductal carcinoma, with all 5 sentinel lymph nodes negative  (6) the patient completed postmastectomy radiation to the right chest wall and right supraclavicular fossa 02/28/2012  (7) status post-op trastuzumab/ pertuzumab x3, discontinued 01/28/2012  (8) trastuzumab continued 02/18/2012, completed April 2014. Most recent echocardiogram 06/26/2012 showed a well preserved ejection fraction.  (9) tamoxifen started 03/25/2012  PLAN:  Doshie appears to be doing very well with regards to her breast cancer, and will continue on tamoxifen as before. We are going to obtain a chest x-ray today, especially in light of the fact that she has had an increased cough recently. I plan is to see her again for repeat labs and physical exam in 6 months, February 2015.  Overall, our plan is to continue tamoxifen for 2 years, until February of 2016, then discuss whether or not to switch to an aromatase inhibitor.  All this was reviewed with Ajia today who voices understanding and agreement with our plan. She will call with any changes or problems prior to her next appointment.   Gerica Koble    09/28/2012

## 2012-09-28 NOTE — Telephone Encounter (Signed)
, °

## 2012-09-28 NOTE — Telephone Encounter (Signed)
Spoke with pts spouse regarding results of chest x ray. Per AB, results are ok. Pts spouse voiced understanding and knows to call the office with any questions. TMB

## 2012-10-02 DIAGNOSIS — D47Z9 Other specified neoplasms of uncertain behavior of lymphoid, hematopoietic and related tissue: Secondary | ICD-10-CM | POA: Diagnosis not present

## 2012-11-21 ENCOUNTER — Other Ambulatory Visit: Payer: Self-pay | Admitting: Oncology

## 2012-11-21 ENCOUNTER — Telehealth: Payer: Self-pay | Admitting: *Deleted

## 2012-11-21 ENCOUNTER — Other Ambulatory Visit: Payer: Self-pay | Admitting: *Deleted

## 2012-11-21 DIAGNOSIS — C50411 Malignant neoplasm of upper-outer quadrant of right female breast: Secondary | ICD-10-CM

## 2012-11-21 DIAGNOSIS — C50912 Malignant neoplasm of unspecified site of left female breast: Secondary | ICD-10-CM

## 2012-11-21 DIAGNOSIS — K625 Hemorrhage of anus and rectum: Secondary | ICD-10-CM

## 2012-11-21 DIAGNOSIS — D6481 Anemia due to antineoplastic chemotherapy: Secondary | ICD-10-CM

## 2012-11-21 MED ORDER — GABAPENTIN 100 MG PO CAPS: 200.0000 mg | ORAL_CAPSULE | Freq: Every day | ORAL | Status: DC

## 2012-11-21 NOTE — Telephone Encounter (Signed)
This RN spoke with pt per her call regarding changes in neurontin dose per ongoing hot flashes. Pt is now using 200mg  nightly and is requesting refill to indicate use with appropriate dispense amount.  Kirsten Gibson also stated she went to [facility] for follow up for history of lymphoma. Per review of labs concern was stated due to " anemia ". Kirsten Gibson states they were going to do additional test but stated they could not due to pt currently taking oral iron replacement.  Kirsten Gibson is hoping to get lab done in February but did not get names of test that were to be done at [facility]. She would like to be off iron as indicated for test if needed.

## 2012-11-24 ENCOUNTER — Other Ambulatory Visit: Payer: Self-pay | Admitting: Oncology

## 2012-11-24 DIAGNOSIS — C50411 Malignant neoplasm of upper-outer quadrant of right female breast: Secondary | ICD-10-CM

## 2012-12-04 ENCOUNTER — Other Ambulatory Visit: Payer: Self-pay | Admitting: *Deleted

## 2012-12-04 DIAGNOSIS — C50912 Malignant neoplasm of unspecified site of left female breast: Secondary | ICD-10-CM

## 2012-12-04 MED ORDER — GABAPENTIN 100 MG PO CAPS: 200.0000 mg | ORAL_CAPSULE | Freq: Every day | ORAL | Status: DC

## 2012-12-05 DIAGNOSIS — Z23 Encounter for immunization: Secondary | ICD-10-CM | POA: Diagnosis not present

## 2012-12-14 DIAGNOSIS — L821 Other seborrheic keratosis: Secondary | ICD-10-CM | POA: Diagnosis not present

## 2012-12-14 DIAGNOSIS — L719 Rosacea, unspecified: Secondary | ICD-10-CM | POA: Diagnosis not present

## 2012-12-14 DIAGNOSIS — C8409 Mycosis fungoides, extranodal and solid organ sites: Secondary | ICD-10-CM | POA: Diagnosis not present

## 2012-12-14 DIAGNOSIS — D239 Other benign neoplasm of skin, unspecified: Secondary | ICD-10-CM | POA: Diagnosis not present

## 2013-02-07 DIAGNOSIS — D3130 Benign neoplasm of unspecified choroid: Secondary | ICD-10-CM | POA: Diagnosis not present

## 2013-02-07 DIAGNOSIS — H04129 Dry eye syndrome of unspecified lacrimal gland: Secondary | ICD-10-CM | POA: Diagnosis not present

## 2013-02-07 DIAGNOSIS — H02059 Trichiasis without entropian unspecified eye, unspecified eyelid: Secondary | ICD-10-CM | POA: Diagnosis not present

## 2013-02-07 DIAGNOSIS — L719 Rosacea, unspecified: Secondary | ICD-10-CM | POA: Diagnosis not present

## 2013-02-07 DIAGNOSIS — H2510 Age-related nuclear cataract, unspecified eye: Secondary | ICD-10-CM | POA: Diagnosis not present

## 2013-02-07 DIAGNOSIS — H01009 Unspecified blepharitis unspecified eye, unspecified eyelid: Secondary | ICD-10-CM | POA: Diagnosis not present

## 2013-02-07 DIAGNOSIS — H25049 Posterior subcapsular polar age-related cataract, unspecified eye: Secondary | ICD-10-CM | POA: Diagnosis not present

## 2013-02-17 ENCOUNTER — Other Ambulatory Visit: Payer: Self-pay | Admitting: Oncology

## 2013-02-19 NOTE — Telephone Encounter (Signed)
RECEIVED A FAX FROM [facility] CONCERNING A PRIOR AUTHORIZATION FOR POLY-IRON 150. THIS REQUEST WAS PLACED IN THE MANAGED CARE BIN.

## 2013-03-09 ENCOUNTER — Telehealth: Payer: Self-pay | Admitting: *Deleted

## 2013-03-09 NOTE — Telephone Encounter (Signed)
sw pt she already aware of her appt change...td

## 2013-03-26 ENCOUNTER — Other Ambulatory Visit (HOSPITAL_BASED_OUTPATIENT_CLINIC_OR_DEPARTMENT_OTHER): Payer: Medicare Other

## 2013-03-26 DIAGNOSIS — C50912 Malignant neoplasm of unspecified site of left female breast: Secondary | ICD-10-CM

## 2013-03-26 DIAGNOSIS — D649 Anemia, unspecified: Secondary | ICD-10-CM

## 2013-03-26 DIAGNOSIS — C50119 Malignant neoplasm of central portion of unspecified female breast: Secondary | ICD-10-CM | POA: Diagnosis not present

## 2013-03-26 DIAGNOSIS — C50411 Malignant neoplasm of upper-outer quadrant of right female breast: Secondary | ICD-10-CM

## 2013-03-26 DIAGNOSIS — T451X5A Adverse effect of antineoplastic and immunosuppressive drugs, initial encounter: Secondary | ICD-10-CM

## 2013-03-26 DIAGNOSIS — C50419 Malignant neoplasm of upper-outer quadrant of unspecified female breast: Secondary | ICD-10-CM

## 2013-03-26 DIAGNOSIS — D6481 Anemia due to antineoplastic chemotherapy: Secondary | ICD-10-CM

## 2013-03-26 LAB — COMPREHENSIVE METABOLIC PANEL (CC13)
ALT: 20 U/L (ref 0–55)
AST: 25 U/L (ref 5–34)
Albumin: 3.9 g/dL (ref 3.5–5.0)
Alkaline Phosphatase: 41 U/L (ref 40–150)
Anion Gap: 8 meq/L (ref 3–11)
BUN: 23.4 mg/dL (ref 7.0–26.0)
CO2: 31 meq/L — ABNORMAL HIGH (ref 22–29)
Calcium: 9.7 mg/dL (ref 8.4–10.4)
Chloride: 102 meq/L (ref 98–109)
Creatinine: 0.8 mg/dL (ref 0.6–1.1)
Glucose: 92 mg/dL (ref 70–140)
Potassium: 3.7 meq/L (ref 3.5–5.1)
Sodium: 141 meq/L (ref 136–145)
Total Bilirubin: 0.21 mg/dL (ref 0.20–1.20)
Total Protein: 6.6 g/dL (ref 6.4–8.3)

## 2013-03-26 LAB — CBC WITH DIFFERENTIAL/PLATELET
BASO%: 0.6 % (ref 0.0–2.0)
Basophils Absolute: 0 10e3/uL (ref 0.0–0.1)
EOS%: 1.3 % (ref 0.0–7.0)
Eosinophils Absolute: 0.1 10e3/uL (ref 0.0–0.5)
HCT: 33.9 % — ABNORMAL LOW (ref 34.8–46.6)
HGB: 11.6 g/dL (ref 11.6–15.9)
LYMPH%: 31 % (ref 14.0–49.7)
MCH: 32.6 pg (ref 25.1–34.0)
MCHC: 34.2 g/dL (ref 31.5–36.0)
MCV: 95.2 fL (ref 79.5–101.0)
MONO#: 0.5 10e3/uL (ref 0.1–0.9)
MONO%: 8.5 % (ref 0.0–14.0)
NEUT#: 3.2 10e3/uL (ref 1.5–6.5)
NEUT%: 58.6 % (ref 38.4–76.8)
Platelets: 182 10e3/uL (ref 145–400)
RBC: 3.56 10e6/uL — ABNORMAL LOW (ref 3.70–5.45)
RDW: 12.4 % (ref 11.2–14.5)
WBC: 5.4 10e3/uL (ref 3.9–10.3)
lymph#: 1.7 10e3/uL (ref 0.9–3.3)
nRBC: 0 % (ref 0–0)

## 2013-03-26 LAB — IRON AND TIBC CHCC
%SAT: 22 % (ref 21–57)
Iron: 74 ug/dL (ref 41–142)
TIBC: 331 ug/dL (ref 236–444)
UIBC: 257 ug/dL (ref 120–384)

## 2013-03-26 LAB — FERRITIN CHCC: FERRITIN: 66 ng/mL (ref 9–269)

## 2013-03-27 ENCOUNTER — Other Ambulatory Visit: Payer: Self-pay | Admitting: *Deleted

## 2013-03-27 DIAGNOSIS — C50912 Malignant neoplasm of unspecified site of left female breast: Secondary | ICD-10-CM

## 2013-03-27 MED ORDER — GABAPENTIN 100 MG PO CAPS: 300.0000 mg | ORAL_CAPSULE | Freq: Every day | ORAL | Status: DC

## 2013-03-27 NOTE — Telephone Encounter (Signed)
Kirsten Gibson called to this RN to state request for refill on gabapentin with request for increased dispense amount due to recent increase in dose to 300mg  qhs - pt uses for hot flashes.  This RN obtained refill with appropriate instructions to dispense the 100mg  tablet for titration purposes with refills.

## 2013-03-29 ENCOUNTER — Other Ambulatory Visit: Payer: Medicare Other

## 2013-04-02 ENCOUNTER — Encounter (INDEPENDENT_AMBULATORY_CARE_PROVIDER_SITE_OTHER): Payer: Self-pay

## 2013-04-02 ENCOUNTER — Telehealth: Payer: Self-pay | Admitting: *Deleted

## 2013-04-02 ENCOUNTER — Other Ambulatory Visit: Payer: Self-pay | Admitting: Oncology

## 2013-04-02 ENCOUNTER — Ambulatory Visit (HOSPITAL_BASED_OUTPATIENT_CLINIC_OR_DEPARTMENT_OTHER): Payer: Medicare Other | Admitting: Oncology

## 2013-04-02 VITALS — BP 128/80 | HR 76 | Temp 97.9°F | Resp 18 | Ht 60.0 in | Wt 109.9 lb

## 2013-04-02 DIAGNOSIS — R079 Chest pain, unspecified: Secondary | ICD-10-CM | POA: Diagnosis not present

## 2013-04-02 DIAGNOSIS — C50119 Malignant neoplasm of central portion of unspecified female breast: Secondary | ICD-10-CM | POA: Diagnosis not present

## 2013-04-02 DIAGNOSIS — D6481 Anemia due to antineoplastic chemotherapy: Secondary | ICD-10-CM

## 2013-04-02 DIAGNOSIS — C50419 Malignant neoplasm of upper-outer quadrant of unspecified female breast: Secondary | ICD-10-CM | POA: Diagnosis not present

## 2013-04-02 DIAGNOSIS — C773 Secondary and unspecified malignant neoplasm of axilla and upper limb lymph nodes: Secondary | ICD-10-CM

## 2013-04-02 DIAGNOSIS — Z170 Estrogen receptor positive status [ER+]: Secondary | ICD-10-CM | POA: Diagnosis not present

## 2013-04-02 DIAGNOSIS — C50411 Malignant neoplasm of upper-outer quadrant of right female breast: Secondary | ICD-10-CM

## 2013-04-02 DIAGNOSIS — C50912 Malignant neoplasm of unspecified site of left female breast: Secondary | ICD-10-CM

## 2013-04-02 DIAGNOSIS — R059 Cough, unspecified: Secondary | ICD-10-CM | POA: Diagnosis not present

## 2013-04-02 DIAGNOSIS — G4700 Insomnia, unspecified: Secondary | ICD-10-CM | POA: Diagnosis not present

## 2013-04-02 DIAGNOSIS — D649 Anemia, unspecified: Secondary | ICD-10-CM | POA: Diagnosis not present

## 2013-04-02 DIAGNOSIS — T451X5A Adverse effect of antineoplastic and immunosuppressive drugs, initial encounter: Secondary | ICD-10-CM

## 2013-04-02 DIAGNOSIS — R05 Cough: Secondary | ICD-10-CM

## 2013-04-02 LAB — FECAL OCCULT BLOOD, GUAIAC: Occult Blood: NEGATIVE

## 2013-04-02 NOTE — Telephone Encounter (Signed)
appts made and printed...td 

## 2013-04-02 NOTE — Progress Notes (Signed)
ID: Kirsten Gibson   DOB: [birth year]  MR#: [id number]  GBT#:[id number]  PCP: [name], MD GYN:  SU: [name], [name] OTHER XT:[id number] [name]   HISTORY OF PRESENT ILLNESS: She palpated a right breast mass March 2013 and presented for a mammogram. This confirmed the presence of a 1.9 cm mass in the upper outer quadrants. And adjacent mass was noted which was possibly thought to represent a intramammary lymph node. A right axillary lymph node was also noted to be enlarged. Biopsy of the upper outer quadrant mass showed a grade 2 invasive ductal carcinoma which was ER/PR positive HER-2 positive. Ki 67 was 12%. An MRI of the breast was performed 05/10/2011 which showed an overall area of the right breast measuring 5.0 x 2.6 x 2.2 cm of clumped linear enhancement. Majority of this was felt to represent DCIS. A small area of clumped enhancement was noted in the left breast which was not seen on the mammogram. A biopsy of this area was recommended. A biopsy of the right axillary lymph node was also positive for invasive ductal carcinoma. The patient's subsequent history is as detailed below  INTERVAL HISTORY: Kirsten Gibson returns today for followup of her bilateral breast cancer accompanied by her husband [name]. Since her last visit here she feels she is really "turning the corner", and feeling much more normal than before. She has more energy and, can do all her activities of daily living, and is walking a mile a day in the morning on her treadmill.   REVIEW OF SYSTEMS: Kirsten Gibson tells me her taste sensation is now normal. Her energy is better, as just described. The tingling that she was having in her fingertips has completely resolved. In her toes the tingling is gone to but she has a little bit of numbness in the toe tips. The left big toe nail is "change", and she wanted me to examine that today. She also has a couple of "rough spot" on the skin of her  feet. There have been no unusual headaches, visual changes, and her cough has resolved. She does have seasonal allergy problems. She has significant insomnia problems and wonders what she can do now that she has developed a tolerance to the Restoril. A detailed review of systems today was otherwise stable.  PAST MEDICAL HISTORY: Past Medical History  Diagnosis Date  . Loose stools   . Frequency   . Anxiety   . TMJ click   . S/P chemotherapy, time since 4-12 weeks   . Rosacea   . Anemia due to chemotherapy 08/23/2011  . Hx of radiation therapy 01/11/12- 02/28/12    right chest wall/supraclav fossa/drain site/scar boost  . Breast cancer     bilateral infiltrating ductal carcinoma  . Mycosis fungoides     follows up with [facility] once to twice per year     PAST SURGICAL HISTORY: Past Surgical History  Procedure Laterality Date  . Cholecystectomy    . Benign tumor removed from neck  1974  . Rt breast biobsy    . Breast biopsy  05/19/11    left  and right breast  . Portacath placement  05/21/2011    Procedure: INSERTION PORT-A-CATH;  Surgeon: [name], MD;  Location: [facility];  Service: General;  Laterality: Left;  . Abdominal hysterectomy  1988    partial with on ovary remaining.  [name] Biopsy breast      right breast biopsy   . Mastectomy  11/2011    bilat, [place] removal Left 04/06/2012    Procedure: MINOR REMOVAL PORT-A-CATH;  Surgeon: [name], MD;  Location: [facility];  Service: General;  Laterality: Left;    FAMILY HISTORY Family History  Problem Relation Age of Onset  . Cancer Maternal Aunt     breast to brain  . Cancer Paternal Grandmother     breast and/or ovarian///unknown  . Cancer Paternal Grandfather     stomach  . Colon cancer Neg Hx    the patient's father is still living, currently 51 years old. The patient's mother died at the age of 37 from complications of diabetes. The patient had no brothers, 3 sisters.  One of the patient's mother is 2 sisters was diagnosed with breast cancer in her late 20s. There is no other history of breast or ovarian cancer in the family. The patient tells me she was evaluated for genetic testing at [facility], but that the insurance would not approve of the test.  GYNECOLOGIC HISTORY: Menarche age 46, she is GX P0. She underwent hysterectomy and unilateral salpingo-oophorectomy in 1988. She took hormone replacement for approximately 3 years.  SOCIAL HISTORY: She used to work at [employer], but is now retired. Her husband of 21 years, [name], used to work for the [employer]. He has a son from a prior marriage, and 2 grandchildren. The patient attends [place] [employer] [place].   ADVANCED DIRECTIVES: Not in place  HEALTH MAINTENANCE: History  Substance Use Topics  . Smoking status: Former Smoker -- 1.00 packs/day for 20 years    Types: Cigarettes    Quit date: 02/08/1986  . Smokeless tobacco: Never Used  . Alcohol Use: No     Colonoscopy:  April 2014, [facility]  PAP:  Bone density: 02/23/2012, osteopenia, [facility]  Lipid panel:  Allergies  Allergen Reactions  . Latex Dermatitis and Rash  . Adhesive [Tape]     ALLERGIC TO OP-SITE......USE OP-SITE FLEXIGRID INSTEAD !!!  . Zofran [Ondansetron Hcl] Swelling    Swelling&redness to face!!  . Diclofenac Other (See Comments)    Fever, chills  . Naproxen Other (See Comments)    Fever, chills    Current Outpatient Prescriptions  Medication Sig Dispense Refill  . calcium carbonate (OS-CAL) 600 MG TABS Take 600 mg by mouth 3 (three) times daily with meals.      . Cholecalciferol (VITAMIN D3 PO) Take 3,000 mg by mouth daily.       [name] Calcium (STOOL SOFTENER PO) Take 50 mg by mouth 2 (two) times daily.      [name] DOXYLAMINE SUCCINATE, SLEEP, PO Take 15 mg by mouth at bedtime.      . gabapentin (NEURONTIN) 100 MG capsule Take 3 capsules (300 mg total) by mouth at  bedtime.  270 capsule  3  . hydrocortisone (ANUSOL-HC) 25 MG suppository Place 1 suppository (25 mg total) rectally at bedtime as needed for hemorrhoids.  12 suppository  1  . Multiple Vitamins-Minerals (MULTIVITAMIN PO) Take 1 capsule by mouth daily.      . Omega 3-6-9 Fatty Acids (OMEGA-3 & OMEGA-6 FISH OIL PO) Take 720 mg by mouth daily.      . Omega-3 Fatty Acids (OMEGA 3 PO) Take 2,200 mg by mouth daily.      [name] POLY-IRON 150 150 MG capsule Take one capsule by mouth twice daily  180 capsule  0  . tamoxifen (NOLVADEX) 20 MG tablet Take  1 tablet (20 mg total) by mouth daily.  90 tablet  4  . temazepam (RESTORIL) 7.5 MG capsule Take 1-2 capsules by mouth nightly at bedtime as needed for sleep.  180 capsule  0   No current facility-administered medications for this visit.    OBJECTIVE: Middle-aged white woman who appears well Filed Vitals:   04/02/13 0845  BP: 128/80  Pulse: 76  Temp: 97.9 F (36.6 C)  Resp: 18     Body mass index is 21.46 kg/(m^2).    ECOG FS: 1 Filed Weights   04/02/13 0845  Weight: 109 lb 14.4 oz (49.85 kg)   Sclerae unicteric, pupils equal and reactive Oropharynx clear and moist No cervical or supraclavicular adenopathy Lungs are clear bilaterally with good excursion Heart regular rate and rhythm, no murmur appreciated Abdomen soft, thin, nontender, positive bowel sounds MSK no focal spinal tenderness, no lymphedema in the upper extremities Neuro: nonfocal, well oriented, anxious affect Breasts: Status post bilateral mastectomies. There is no evidence of local recurrence. Axillae are benign bilaterally.    LAB RESULTS: Lab Results  Component Value Date   WBC 5.4 03/26/2013   NEUTROABS 3.2 03/26/2013   HGB 11.6 03/26/2013   HCT 33.9* 03/26/2013   MCV 95.2 03/26/2013   PLT 182 03/26/2013      Chemistry      Component Value Date/Time   NA 141 03/26/2013 1326   NA 139 01/28/2012 1636   K 3.7 03/26/2013 1326   K 3.5 01/28/2012 1636   CL 104 06/28/2012  0950   CL 102 01/28/2012 1636   CO2 31* 03/26/2013 1326   CO2 27 01/28/2012 1636   BUN 23.4 03/26/2013 1326   BUN 29* 01/28/2012 1636   CREATININE 0.8 03/26/2013 1326   CREATININE 1.01 01/28/2012 1636      Component Value Date/Time   CALCIUM 9.7 03/26/2013 1326   CALCIUM 9.2 01/28/2012 1636   ALKPHOS 41 03/26/2013 1326   ALKPHOS 84 01/28/2012 1636   AST 25 03/26/2013 1326   AST 23 01/28/2012 1636   ALT 20 03/26/2013 1326   ALT 20 01/28/2012 1636   BILITOT 0.21 03/26/2013 1326   BILITOT 0.2* 01/28/2012 1636     FERRITIN  78  09/21/2012  Lab Results  Component Value Date   LABCA2 21 02/18/2012     STUDIES:  echocardiogram 06/26/2012 shows a well preserved ejection fraction  B[facility] scan at [facility] on 03/13/2012 showed osteopenia.   ASSESSMENT: 68 y.o. [place] woman  (1) status post right breast and axillary lymph node biopsy 05/04/2011, both positive for a grade 2 invasive ductal carcinoma, cT2 pN1 or stage IIB,estrogen receptor 82% and progesterone receptor 92% positive, with an MIB-1 of 12%, and HER-2 amplification by CISH with a ratio of 2.86  (2) biopsy 05/19/2011 of a second right breast area showed low-grade invasive ductal carcinoma, estrogen and progesterone receptor both 100% positive, with an MIB-1 of 13%, and no HER-2 amplification  (3) left breast biopsy 05/19/2011 showed a low-grade invasive ductal carcinoma, e-cadherin positive, HER-2 negative  (4) neoadjuvant treatment consisted of carboplatin/ docetaxel/ trastuzumab x6, started 06/02/2011 and completed 09/29/2011  (5) status post bilateral mastectomies at [facility] October 2013 showing  (a) on the right, a residual 1.2 cm invasive ductal carcinoma in the breast, with 3 of 19 lymph nodes involved; an additional lymph node showed only isolated tumor cells, so this is a ypT1c ypN1 result  (b) on the left, there was a 1 mm area of residual invasive  ductal carcinoma, with all 5 sentinel lymph nodes negative  (6)  the patient completed postmastectomy radiation to the right chest wall and right supraclavicular fossa 02/28/2012  (7) status post-op trastuzumab/ pertuzumab x3, discontinued 01/28/2012  (8) trastuzumab continued 02/18/2012, completed April 2014. Most recent echocardiogram 06/26/2012 showed a well preserved ejection fraction.  (9) tamoxifen started 03/25/2012  PLAN:  Emmanuel is doing fine as far as her breast cancer is concerned. We spent approximately one hour today going over multiple concerns. We discussed her insomnia. She will go off the Restoril, and try Benadryl. Once she has been off the Restoril for 2 weeks she can try taking it once or twice a week but no more than that or she will become tolerant began.  She was concerned about a pain in the right side of the chest which I think is going to be related to her scoliosis and not to cancer and outside (it is positional). She is concerned about her little toes being a little red and I think basically her shoes are a little bit too tight. She will work on that and if it doesn't work we will send her to a podiatrist. She wondered if your immune system was "wrong" now after chemotherapy. I reassured her in that regard. She is a little bit of a dry cough, which may well be due to reflux, or post nasal drip. It does not sound like a cancer cough and I gave her a copy of her chest x-ray from August, where she already had the cough, and it was fine.  She wonders if she would qualify for a chest CT scan to screen for lung cancer. She has a 20-pack-year smoking history, quitting in 1988. I will research that for her.  Her ferritin is now in the normal range. I am comfortable with her stopping the iron replacement, which is causing her some GI symptoms.  Otherwise I am delighted that she is doing so well. I wrote her a prescription for per cc and a new bra. She will see [place] again in 6 months with repeat ferritin prior to that visit.   Ardeth Repetto C     04/02/2013

## 2013-04-05 ENCOUNTER — Ambulatory Visit: Payer: Medicare Other | Admitting: Oncology

## 2013-05-15 ENCOUNTER — Other Ambulatory Visit: Payer: Self-pay | Admitting: Physician Assistant

## 2013-05-15 DIAGNOSIS — C50419 Malignant neoplasm of upper-outer quadrant of unspecified female breast: Secondary | ICD-10-CM

## 2013-05-16 ENCOUNTER — Other Ambulatory Visit: Payer: Self-pay | Admitting: Oncology

## 2013-05-16 DIAGNOSIS — C50919 Malignant neoplasm of unspecified site of unspecified female breast: Secondary | ICD-10-CM

## 2013-07-05 ENCOUNTER — Other Ambulatory Visit: Payer: Self-pay | Admitting: *Deleted

## 2013-07-05 DIAGNOSIS — R6889 Other general symptoms and signs: Secondary | ICD-10-CM

## 2013-07-05 DIAGNOSIS — G609 Hereditary and idiopathic neuropathy, unspecified: Secondary | ICD-10-CM

## 2013-07-05 DIAGNOSIS — D509 Iron deficiency anemia, unspecified: Secondary | ICD-10-CM

## 2013-07-05 DIAGNOSIS — D649 Anemia, unspecified: Secondary | ICD-10-CM

## 2013-07-05 DIAGNOSIS — R531 Weakness: Secondary | ICD-10-CM

## 2013-07-06 ENCOUNTER — Other Ambulatory Visit: Payer: Self-pay | Admitting: Physician Assistant

## 2013-07-06 ENCOUNTER — Other Ambulatory Visit (HOSPITAL_BASED_OUTPATIENT_CLINIC_OR_DEPARTMENT_OTHER): Payer: Medicare Other

## 2013-07-06 ENCOUNTER — Other Ambulatory Visit: Payer: Self-pay | Admitting: *Deleted

## 2013-07-06 DIAGNOSIS — C50419 Malignant neoplasm of upper-outer quadrant of unspecified female breast: Secondary | ICD-10-CM

## 2013-07-06 DIAGNOSIS — C773 Secondary and unspecified malignant neoplasm of axilla and upper limb lymph nodes: Secondary | ICD-10-CM

## 2013-07-06 DIAGNOSIS — C50119 Malignant neoplasm of central portion of unspecified female breast: Secondary | ICD-10-CM | POA: Diagnosis not present

## 2013-07-06 DIAGNOSIS — D509 Iron deficiency anemia, unspecified: Secondary | ICD-10-CM

## 2013-07-06 DIAGNOSIS — G609 Hereditary and idiopathic neuropathy, unspecified: Secondary | ICD-10-CM

## 2013-07-06 DIAGNOSIS — R6889 Other general symptoms and signs: Secondary | ICD-10-CM

## 2013-07-06 DIAGNOSIS — R531 Weakness: Secondary | ICD-10-CM

## 2013-07-06 DIAGNOSIS — D649 Anemia, unspecified: Secondary | ICD-10-CM

## 2013-07-06 LAB — SEDIMENTATION RATE: Sed Rate: 5 mm/hr (ref 0–22)

## 2013-07-06 LAB — IRON AND TIBC CHCC
%SAT: 28 % (ref 21–57)
Iron: 98 ug/dL (ref 41–142)
TIBC: 350 ug/dL (ref 236–444)
UIBC: 252 ug/dL (ref 120–384)

## 2013-07-06 LAB — CBC WITH DIFFERENTIAL/PLATELET
BASO%: 0.8 % (ref 0.0–2.0)
Basophils Absolute: 0 10*3/uL (ref 0.0–0.1)
EOS%: 1.4 % (ref 0.0–7.0)
Eosinophils Absolute: 0.1 10*3/uL (ref 0.0–0.5)
HEMATOCRIT: 36 % (ref 34.8–46.6)
HGB: 11.9 g/dL (ref 11.6–15.9)
LYMPH%: 34.7 % (ref 14.0–49.7)
MCH: 32.3 pg (ref 25.1–34.0)
MCHC: 33.2 g/dL (ref 31.5–36.0)
MCV: 97.3 fL (ref 79.5–101.0)
MONO#: 0.5 10*3/uL (ref 0.1–0.9)
MONO%: 9.4 % (ref 0.0–14.0)
NEUT#: 3.1 10*3/uL (ref 1.5–6.5)
NEUT%: 53.7 % (ref 38.4–76.8)
PLATELETS: 185 10*3/uL (ref 145–400)
RBC: 3.7 10*6/uL (ref 3.70–5.45)
RDW: 12.2 % (ref 11.2–14.5)
WBC: 5.8 10*3/uL (ref 3.9–10.3)
lymph#: 2 10*3/uL (ref 0.9–3.3)

## 2013-07-06 LAB — FERRITIN CHCC: Ferritin: 72 ng/ml (ref 9–269)

## 2013-07-06 LAB — VITAMIN B12: Vitamin B-12: 552 pg/mL (ref 211–911)

## 2013-07-09 ENCOUNTER — Telehealth: Payer: Self-pay | Admitting: *Deleted

## 2013-07-10 DIAGNOSIS — R209 Unspecified disturbances of skin sensation: Secondary | ICD-10-CM | POA: Diagnosis not present

## 2013-07-10 DIAGNOSIS — Z0000 Encounter for general adult medical examination without abnormal findings: Secondary | ICD-10-CM | POA: Diagnosis not present

## 2013-07-10 DIAGNOSIS — C50919 Malignant neoplasm of unspecified site of unspecified female breast: Secondary | ICD-10-CM | POA: Diagnosis not present

## 2013-07-10 DIAGNOSIS — M899 Disorder of bone, unspecified: Secondary | ICD-10-CM | POA: Diagnosis not present

## 2013-07-10 DIAGNOSIS — Z79899 Other long term (current) drug therapy: Secondary | ICD-10-CM | POA: Diagnosis not present

## 2013-07-10 DIAGNOSIS — E7800 Pure hypercholesterolemia, unspecified: Secondary | ICD-10-CM | POA: Diagnosis not present

## 2013-07-31 ENCOUNTER — Other Ambulatory Visit: Payer: Self-pay | Admitting: Family Medicine

## 2013-07-31 ENCOUNTER — Other Ambulatory Visit (HOSPITAL_COMMUNITY)
Admission: RE | Admit: 2013-07-31 | Discharge: 2013-07-31 | Disposition: A | Discharge status: Home / Self Care | Payer: Medicare Other | Source: Ambulatory Visit | Attending: Family Medicine | Admitting: Family Medicine

## 2013-07-31 DIAGNOSIS — J301 Allergic rhinitis due to pollen: Secondary | ICD-10-CM | POA: Diagnosis not present

## 2013-07-31 DIAGNOSIS — Z0000 Encounter for general adult medical examination without abnormal findings: Secondary | ICD-10-CM | POA: Diagnosis not present

## 2013-07-31 DIAGNOSIS — Z23 Encounter for immunization: Secondary | ICD-10-CM | POA: Diagnosis not present

## 2013-07-31 DIAGNOSIS — G479 Sleep disorder, unspecified: Secondary | ICD-10-CM | POA: Diagnosis not present

## 2013-07-31 DIAGNOSIS — M6530 Trigger finger, unspecified finger: Secondary | ICD-10-CM | POA: Diagnosis not present

## 2013-07-31 DIAGNOSIS — Z124 Encounter for screening for malignant neoplasm of cervix: Secondary | ICD-10-CM | POA: Insufficient documentation

## 2013-07-31 DIAGNOSIS — IMO0002 Reserved for concepts with insufficient information to code with codable children: Secondary | ICD-10-CM | POA: Diagnosis not present

## 2013-07-31 DIAGNOSIS — R350 Frequency of micturition: Secondary | ICD-10-CM | POA: Diagnosis not present

## 2013-07-31 DIAGNOSIS — M899 Disorder of bone, unspecified: Secondary | ICD-10-CM | POA: Diagnosis not present

## 2013-08-01 LAB — CYTOLOGY - PAP

## 2013-08-03 DIAGNOSIS — R350 Frequency of micturition: Secondary | ICD-10-CM | POA: Diagnosis not present

## 2013-08-03 DIAGNOSIS — Z0000 Encounter for general adult medical examination without abnormal findings: Secondary | ICD-10-CM | POA: Diagnosis not present

## 2013-08-31 ENCOUNTER — Other Ambulatory Visit: Payer: Self-pay | Admitting: *Deleted

## 2013-08-31 DIAGNOSIS — C50919 Malignant neoplasm of unspecified site of unspecified female breast: Secondary | ICD-10-CM

## 2013-08-31 MED ORDER — TEMAZEPAM 7.5 MG PO CAPS: ORAL_CAPSULE | ORAL | Status: DC

## 2013-09-04 ENCOUNTER — Encounter: Payer: Self-pay | Admitting: Oncology

## 2013-09-04 NOTE — Telephone Encounter (Signed)
RECEIVED A FAX FROM [facility] CONCERNING A PRIOR AUTHORIZATION FOR TEMAZEPAM. THIS REQUEST WAS PLACED IN THE MANAGED CARE BIN.

## 2013-09-04 NOTE — Progress Notes (Signed)
[employer] approved restoril 7.5mg  180 pills from 07/06/13-09/04/14

## 2013-09-24 ENCOUNTER — Other Ambulatory Visit (HOSPITAL_BASED_OUTPATIENT_CLINIC_OR_DEPARTMENT_OTHER): Payer: Medicare Other

## 2013-09-24 DIAGNOSIS — C50419 Malignant neoplasm of upper-outer quadrant of unspecified female breast: Secondary | ICD-10-CM | POA: Diagnosis not present

## 2013-09-24 DIAGNOSIS — C50912 Malignant neoplasm of unspecified site of left female breast: Secondary | ICD-10-CM

## 2013-09-24 DIAGNOSIS — D649 Anemia, unspecified: Secondary | ICD-10-CM

## 2013-09-24 LAB — CBC WITH DIFFERENTIAL/PLATELET
BASO%: 0.5 % (ref 0.0–2.0)
BASOS ABS: 0 10*3/uL (ref 0.0–0.1)
EOS ABS: 0 10*3/uL (ref 0.0–0.5)
EOS%: 0.8 % (ref 0.0–7.0)
HEMATOCRIT: 32.2 % — AB (ref 34.8–46.6)
HEMOGLOBIN: 10.7 g/dL — AB (ref 11.6–15.9)
LYMPH#: 1.6 10*3/uL (ref 0.9–3.3)
LYMPH%: 26.5 % (ref 14.0–49.7)
MCH: 32 pg (ref 25.1–34.0)
MCHC: 33.4 g/dL (ref 31.5–36.0)
MCV: 95.8 fL (ref 79.5–101.0)
MONO#: 0.5 10*3/uL (ref 0.1–0.9)
MONO%: 7.5 % (ref 0.0–14.0)
NEUT%: 64.7 % (ref 38.4–76.8)
NEUTROS ABS: 4 10*3/uL (ref 1.5–6.5)
Platelets: 172 10*3/uL (ref 145–400)
RBC: 3.36 10*6/uL — ABNORMAL LOW (ref 3.70–5.45)
RDW: 12.8 % (ref 11.2–14.5)
WBC: 6.2 10*3/uL (ref 3.9–10.3)

## 2013-09-24 LAB — COMPREHENSIVE METABOLIC PANEL (CC13)
ALT: 16 U/L (ref 0–55)
ANION GAP: 9 meq/L (ref 3–11)
AST: 29 U/L (ref 5–34)
Albumin: 3.6 g/dL (ref 3.5–5.0)
Alkaline Phosphatase: 39 U/L — ABNORMAL LOW (ref 40–150)
BUN: 11.1 mg/dL (ref 7.0–26.0)
CO2: 27 mEq/L (ref 22–29)
CREATININE: 0.8 mg/dL (ref 0.6–1.1)
Calcium: 9 mg/dL (ref 8.4–10.4)
Chloride: 97 mEq/L — ABNORMAL LOW (ref 98–109)
GLUCOSE: 98 mg/dL (ref 70–140)
POTASSIUM: 3.5 meq/L (ref 3.5–5.1)
Sodium: 132 mEq/L — ABNORMAL LOW (ref 136–145)
Total Bilirubin: 0.22 mg/dL (ref 0.20–1.20)
Total Protein: 6.3 g/dL — ABNORMAL LOW (ref 6.4–8.3)

## 2013-09-24 LAB — FERRITIN CHCC: FERRITIN: 82 ng/mL (ref 9–269)

## 2013-10-01 ENCOUNTER — Ambulatory Visit (HOSPITAL_BASED_OUTPATIENT_CLINIC_OR_DEPARTMENT_OTHER): Payer: Medicare Other | Admitting: Oncology

## 2013-10-01 ENCOUNTER — Telehealth: Payer: Self-pay | Admitting: Oncology

## 2013-10-01 ENCOUNTER — Other Ambulatory Visit: Payer: Self-pay | Admitting: Oncology

## 2013-10-01 VITALS — BP 133/66 | HR 64 | Temp 98.6°F | Resp 18 | Ht 60.0 in | Wt 112.8 lb

## 2013-10-01 DIAGNOSIS — D649 Anemia, unspecified: Secondary | ICD-10-CM | POA: Diagnosis not present

## 2013-10-01 DIAGNOSIS — M899 Disorder of bone, unspecified: Secondary | ICD-10-CM | POA: Diagnosis not present

## 2013-10-01 DIAGNOSIS — M949 Disorder of cartilage, unspecified: Secondary | ICD-10-CM

## 2013-10-01 DIAGNOSIS — C50119 Malignant neoplasm of central portion of unspecified female breast: Secondary | ICD-10-CM

## 2013-10-01 DIAGNOSIS — Z170 Estrogen receptor positive status [ER+]: Secondary | ICD-10-CM

## 2013-10-01 DIAGNOSIS — C773 Secondary and unspecified malignant neoplasm of axilla and upper limb lymph nodes: Secondary | ICD-10-CM | POA: Diagnosis not present

## 2013-10-01 DIAGNOSIS — C50419 Malignant neoplasm of upper-outer quadrant of unspecified female breast: Secondary | ICD-10-CM | POA: Diagnosis not present

## 2013-10-01 DIAGNOSIS — C50912 Malignant neoplasm of unspecified site of left female breast: Secondary | ICD-10-CM

## 2013-10-01 NOTE — Progress Notes (Signed)
ID: Kirsten Gibson   DOB: [birth year]  MR#: [id number]  OXB#:[id number]  PCP: [name], MD GYN:  SU: [name], [name] OTHER AS:[id number] [name]   HISTORY OF PRESENT ILLNESS: From the original antidote:  She palpated a right breast mass March 2013 and presented for a mammogram. This confirmed the presence of a 1.9 cm mass in the upper outer quadrants. And adjacent mass was noted which was possibly thought to represent a intramammary lymph node. A right axillary lymph node was also noted to be enlarged. Biopsy of the upper outer quadrant mass showed a grade 2 invasive ductal carcinoma which was ER/PR positive HER-2 positive. Ki 67 was 12%. An MRI of the breast was performed 05/10/2011 which showed an overall area of the right breast measuring 5.0 x 2.6 x 2.2 cm of clumped linear enhancement. Majority of this was felt to represent DCIS. A small area of clumped enhancement was noted in the left breast which was not seen on the mammogram. A biopsy of this area was recommended. A biopsy of the right axillary lymph node was also positive for invasive ductal carcinoma.   The patient's subsequent history is as detailed below  INTERVAL HISTORY: Kirsten Gibson returns today for followup of her bilateral breast cancer. She continues on tamoxifen and has no side effects from it. She actually worries about whether she should be having side effects, but was reassured that she is welcome to have all the benefits and none of the problems.  REVIEW OF SYSTEMS: [name] is now running on her treadmill about 25 minutes at a time. She feels terrific doing this. She has a torn right knee meniscus, but this is not slowing her down. She and her husband go out frequently to "adventures" like the agriculture extension where she got some honey and learned about the use. They eat a very good diet, mostly vegetables. She has been having a whooshing sound at night in her ear, which  is new for her. It sounds like a pulse. She has excellent bowel movements, and good bladder control. A detailed review of systems today was otherwise noncontributory  PAST MEDICAL HISTORY: Past Medical History  Diagnosis Date  . Loose stools   . Frequency   . Anxiety   . TMJ click   . S/P chemotherapy, time since 4-12 weeks   . Rosacea   . Anemia due to chemotherapy 08/23/2011  . Hx of radiation therapy 01/11/12- 02/28/12    right chest wall/supraclav fossa/drain site/scar boost  . Breast cancer     bilateral infiltrating ductal carcinoma  . Mycosis fungoides     follows up with [facility] once to twice per year     PAST SURGICAL HISTORY: Past Surgical History  Procedure Laterality Date  . Cholecystectomy    . Benign tumor removed from neck  1974  . Rt breast biobsy    . Breast biopsy  05/19/11    left  and right breast  . Portacath placement  05/21/2011    Procedure: INSERTION PORT-A-CATH;  Surgeon: [name], MD;  Location: [facility];  Service: General;  Laterality: Left;  . Abdominal hysterectomy  1988    partial with on ovary remaining.  [name] Biopsy breast      right breast biopsy   . Mastectomy  11/2011    bilat, [place] removal Left 04/06/2012    Procedure: MINOR REMOVAL PORT-A-CATH;  Surgeon: [name], MD;  Location: [facility];  Service: General;  Laterality: Left;    FAMILY HISTORY Family History  Problem Relation Age of Onset  . Cancer Maternal Aunt     breast to brain  . Cancer Paternal Grandmother     breast and/or ovarian///unknown  . Cancer Paternal Grandfather     stomach  . Colon cancer Neg Hx    the patient's father is still living, currently 44 years old. The patient's mother died at the age of 71 from complications of diabetes. The patient had no brothers, 3 sisters. One of the patient's mother is 2 sisters was diagnosed with breast cancer in her late 52s. There is no other history of breast or ovarian  cancer in the family. The patient tells me she was evaluated for genetic testing at [facility], but that the insurance would not approve of the test.  GYNECOLOGIC HISTORY: Menarche age 40, she is GX P0. She underwent hysterectomy and unilateral salpingo-oophorectomy in 1988. She took hormone replacement for approximately 3 years.  SOCIAL HISTORY: She used to work at [employer], but is now retired. Her husband, [name], used to work for the [employer]. He has a son from a prior marriage, and 2 grandchildren. The patient attends [employer] [place].   ADVANCED DIRECTIVES: Not in place  HEALTH MAINTENANCE: History  Substance Use Topics  . Smoking status: Former Smoker -- 1.00 packs/day for 20 years    Types: Cigarettes    Quit date: 02/08/1986  . Smokeless tobacco: Never Used  . Alcohol Use: No     Colonoscopy:  April 2014, [facility]  PAP:  Bone density: 02/23/2012, osteopenia, [facility]  Lipid panel:  Allergies  Allergen Reactions  . Latex Dermatitis and Rash  . Adhesive [Tape]     ALLERGIC TO OP-SITE......USE OP-SITE FLEXIGRID INSTEAD !!!  . Zofran [Ondansetron Hcl] Swelling    Swelling&redness to face!!  . Diclofenac Other (See Comments)    Fever, chills  . Naproxen Other (See Comments)    Fever, chills    Current Outpatient Prescriptions  Medication Sig Dispense Refill  . calcium carbonate (OS-CAL) 600 MG TABS Take 600 mg by mouth 3 (three) times daily with meals.      . Cholecalciferol (VITAMIN D3 PO) Take 3,000 mg by mouth daily.       [name] Calcium (STOOL SOFTENER PO) Take 50 mg by mouth 2 (two) times daily.      [name] DOXYLAMINE SUCCINATE, SLEEP, PO Take 15 mg by mouth at bedtime.      . gabapentin (NEURONTIN) 100 MG capsule Take 3 capsules (300 mg total) by mouth at bedtime.  270 capsule  3  . hydrocortisone (ANUSOL-HC) 25 MG suppository Place 1 suppository (25 mg total) rectally at bedtime as needed for hemorrhoids.   12 suppository  1  . Multiple Vitamins-Minerals (MULTIVITAMIN PO) Take 1 capsule by mouth daily.      . Omega 3-6-9 Fatty Acids (OMEGA-3 & OMEGA-6 FISH OIL PO) Take 720 mg by mouth daily.      . Omega-3 Fatty Acids (OMEGA 3 PO) Take 2,200 mg by mouth daily.      [name] POLY-IRON 150 150 MG capsule Take one capsule by mouth twice daily  180 capsule  0  . tamoxifen (NOLVADEX) 20 MG tablet Take one tablet by mouth one time daily  90 tablet  1  . temazepam (RESTORIL) 7.5 MG capsule 1 po q HS and may repeat x 1  180 capsule  3   No current facility-administered medications for this visit.    OBJECTIVE: Middle-aged white woman in no acute distres Filed Vitals:   10/01/13 0939  BP: 133/66  Pulse: 64  Temp: 98.6 F (37 C)  Resp: 18     Body mass index is 22.03 kg/(m^2).    ECOG FS: 0 Filed Weights   10/01/13 0939  Weight: 112 lb 12.8 oz (51.166 kg)   Sclerae unicteric, pupils round and equal Oropharynx clear and moist No cervical or supraclavicular adenopathy Lungs are clear bilaterally with good excursion Heart regular rate and rhythm, no murmur appreciated Abdomen soft, thin, nontender, positive bowel sounds MSK no focal spinal tenderness, no lymphedema in the upper extremities Neuro: nonfocal, well oriented, anxious affect Breasts: Status post bilateral mastectomies. There is no evidence of local recurrence. Both axillae are benign  LAB RESULTS: Lab Results  Component Value Date   WBC 6.2 09/24/2013   NEUTROABS 4.0 09/24/2013   HGB 10.7* 09/24/2013   HCT 32.2* 09/24/2013   MCV 95.8 09/24/2013   PLT 172 09/24/2013      Chemistry      Component Value Date/Time   NA 132* 09/24/2013 1332   NA 139 01/28/2012 1636   K 3.5 09/24/2013 1332   K 3.5 01/28/2012 1636   CL 104 06/28/2012 0950   CL 102 01/28/2012 1636   CO2 27 09/24/2013 1332   CO2 27 01/28/2012 1636   BUN 11.1 09/24/2013 1332   BUN 29* 01/28/2012 1636   CREATININE 0.8 09/24/2013 1332   CREATININE 1.01 01/28/2012 1636       Component Value Date/Time   CALCIUM 9.0 09/24/2013 1332   CALCIUM 9.2 01/28/2012 1636   ALKPHOS 39* 09/24/2013 1332   ALKPHOS 84 01/28/2012 1636   AST 29 09/24/2013 1332   AST 23 01/28/2012 1636   ALT 16 09/24/2013 1332   ALT 20 01/28/2012 1636   BILITOT 0.22 09/24/2013 1332   BILITOT 0.2* 01/28/2012 1636      Lab Results  Component Value Date   LABCA2 21 02/18/2012     STUDIES:  No results found.  ASSESSMENT: 68 y.o. [place] woman  (1) status post right breast and axillary lymph node biopsy 05/04/2011, both positive for a grade 2 invasive ductal carcinoma, cT2 pN1 or stage IIB,estrogen receptor 82% and progesterone receptor 92% positive, with an MIB-1 of 12%, and HER-2 amplification by CISH with a ratio of 2.86  (2) biopsy 05/19/2011 of a second right breast area showed low-grade invasive ductal carcinoma, estrogen and progesterone receptor both 100% positive, with an MIB-1 of 13%, and no HER-2 amplification  (3) left breast biopsy 05/19/2011 showed a low-grade invasive ductal carcinoma, e-cadherin positive, HER-2 negative  (4) neoadjuvant treatment consisted of carboplatin/ docetaxel/ trastuzumab x6, started 06/02/2011 and completed 09/29/2011  (5) status post bilateral mastectomies at [facility] October 2013 showing  (a) on the right, a residual 1.2 cm invasive ductal carcinoma in the breast, with 3 of 19 lymph nodes involved; an additional lymph node showed only isolated tumor cells, so this is a ypT1c ypN1 result  (b) on the left, there was a 1 mm area of residual invasive ductal carcinoma, with all 5 sentinel lymph nodes negative  (6) the patient completed postmastectomy radiation to the right chest wall and right supraclavicular fossa 02/28/2012  (7) status post-op trastuzumab/ pertuzumab x3, discontinued 01/28/2012  (8) trastuzumab continued 02/18/2012, completed April 2014. Final echocardiogram 06/26/2012 showed a well preserved ejection fraction.  (9) tamoxifen  started  03/25/2012  (10) osteopenia, with next bone density due February 2016.  (11) history are of greater than 20 pack year smoking, low-dose screening chest CT pending  PLAN:  Shawnna looks very healthy. She is very concerned about her labwork. I reassured her regarding the sodium and chloride, which aren't new, previously normal, and likely just a lab variation. On the other hand she has been consistently moderately anemic. I do not have a good explanation for that.  After much discussion we decided we would get her a meeting with our nutritionist, but I am also going to repeat her lab work in 4 weeks. Most likely this sodium chloride will have normalized. I am going to take that opportunity to do a full workup of her anemia at the same time.. Finally we discussed screening for lung cancer since she has a greater than 20 pack year smoking history (she has of course since quit.). I am setting that up for her a month from now and if there is any followup I will set that up before the return visit here, which will be in 6 weeks.  She has a good understanding of the overall plan. She agrees with it. She knows the goal of treatment in her case is cure.   Niva Murren C    10/01/2013

## 2013-10-01 NOTE — Telephone Encounter (Signed)
per 2nd pof to sch nut appt-pt has appt time & date

## 2013-10-01 NOTE — Telephone Encounter (Signed)
per pof to sch pt lab-sch CT because pt wanted to sch lab same day-gave pt copy of sch

## 2013-10-01 NOTE — Addendum Note (Signed)
Addended by: Renford Dills on: 10/01/2013 12:07 PM   Modules accepted: Orders, Medications

## 2013-10-02 ENCOUNTER — Encounter: Payer: Self-pay | Admitting: *Deleted

## 2013-10-02 ENCOUNTER — Encounter: Payer: Self-pay | Admitting: Oncology

## 2013-10-03 ENCOUNTER — Telehealth: Payer: Self-pay | Admitting: Oncology

## 2013-10-03 NOTE — Telephone Encounter (Signed)
Spk w/pt's spouse and advised of apt for Nut class to be r/s per Ernestene Kiel being sick, mailed out sch for pt...Marland KitchenMarland KitchenKJ

## 2013-10-04 ENCOUNTER — Encounter: Payer: Medicare Other | Admitting: Nutrition

## 2013-10-08 ENCOUNTER — Encounter: Payer: Medicare Other | Admitting: Nutrition

## 2013-10-08 DIAGNOSIS — D47Z9 Other specified neoplasms of uncertain behavior of lymphoid, hematopoietic and related tissue: Secondary | ICD-10-CM | POA: Diagnosis not present

## 2013-10-10 DIAGNOSIS — M659 Synovitis and tenosynovitis, unspecified: Secondary | ICD-10-CM | POA: Diagnosis not present

## 2013-10-16 ENCOUNTER — Ambulatory Visit: Payer: Medicare Other | Admitting: Nutrition

## 2013-10-16 NOTE — Progress Notes (Signed)
68 year old female diagnosed with Breast cancer.  Last visit documented on 12/31/2011.  Patient currently on tamoxifen, but has no side effects.  She is running 25 minutes on the treadmill several times a week.  Weight is stable and documented as 112.8 pounds August 24.  BMI within normal limits at 22.03.   Labs noted hemoglobin 10.7, hematocrit 32.2, albumin 3.6. Meds include vitamin B12.  Patient expresses concern with low hemoglobin and hematocrit.  Nutrition diagnosis: Inadequate related knowledge deficit related to anemia, as evidenced by no prior need for nutrition related information.  Intervention:  Patient educated on how to incorporate more iron containing foods into her diet.  Recommended patient should include a source of Vitamin C-containing foods for enhanced absorption. Reviewed strategies for increasing iron absorption.   Provided patient with a fact sheet of vitamin C containing foods, and iron containing foods. Questions answered.  Teach back method used.  Monitoring, evaluation, goals: Patient will incorporate more iron containing foods along with vitamin C sorce to promote increased iron absorption.  Patient has my contact information for questions

## 2013-10-30 ENCOUNTER — Ambulatory Visit (HOSPITAL_COMMUNITY)
Admission: RE | Admit: 2013-10-30 | Discharge: 2013-10-30 | Disposition: A | Discharge status: Home / Self Care | Payer: Medicare Other | Source: Ambulatory Visit | Attending: Oncology | Admitting: Oncology

## 2013-10-30 ENCOUNTER — Other Ambulatory Visit: Payer: Self-pay | Admitting: *Deleted

## 2013-10-30 ENCOUNTER — Other Ambulatory Visit (HOSPITAL_BASED_OUTPATIENT_CLINIC_OR_DEPARTMENT_OTHER): Payer: Medicare Other

## 2013-10-30 DIAGNOSIS — Z87891 Personal history of nicotine dependence: Secondary | ICD-10-CM | POA: Diagnosis not present

## 2013-10-30 DIAGNOSIS — Z122 Encounter for screening for malignant neoplasm of respiratory organs: Secondary | ICD-10-CM | POA: Diagnosis not present

## 2013-10-30 DIAGNOSIS — D539 Nutritional anemia, unspecified: Secondary | ICD-10-CM | POA: Insufficient documentation

## 2013-10-30 DIAGNOSIS — C50419 Malignant neoplasm of upper-outer quadrant of unspecified female breast: Secondary | ICD-10-CM | POA: Insufficient documentation

## 2013-10-30 DIAGNOSIS — D649 Anemia, unspecified: Secondary | ICD-10-CM

## 2013-10-30 DIAGNOSIS — C50919 Malignant neoplasm of unspecified site of unspecified female breast: Secondary | ICD-10-CM | POA: Diagnosis not present

## 2013-10-30 DIAGNOSIS — C50912 Malignant neoplasm of unspecified site of left female breast: Secondary | ICD-10-CM

## 2013-10-30 LAB — CBC & DIFF AND RETIC
BASO%: 0.5 % (ref 0.0–2.0)
Basophils Absolute: 0 10*3/uL (ref 0.0–0.1)
EOS%: 2.1 % (ref 0.0–7.0)
Eosinophils Absolute: 0.1 10*3/uL (ref 0.0–0.5)
HEMATOCRIT: 34.7 % — AB (ref 34.8–46.6)
HGB: 11.5 g/dL — ABNORMAL LOW (ref 11.6–15.9)
Immature Retic Fract: 5.7 % (ref 1.60–10.00)
LYMPH%: 37.7 % (ref 14.0–49.7)
MCH: 32 pg (ref 25.1–34.0)
MCHC: 33.1 g/dL (ref 31.5–36.0)
MCV: 96.7 fL (ref 79.5–101.0)
MONO#: 0.5 10*3/uL (ref 0.1–0.9)
MONO%: 10.3 % (ref 0.0–14.0)
NEUT#: 2.2 10*3/uL (ref 1.5–6.5)
NEUT%: 49.4 % (ref 38.4–76.8)
PLATELETS: 176 10*3/uL (ref 145–400)
RBC: 3.59 10*6/uL — ABNORMAL LOW (ref 3.70–5.45)
RDW: 12.9 % (ref 11.2–14.5)
Retic %: 1.18 % (ref 0.70–2.10)
Retic Ct Abs: 42.36 10*3/uL (ref 33.70–90.70)
WBC: 4.4 10*3/uL (ref 3.9–10.3)
lymph#: 1.6 10*3/uL (ref 0.9–3.3)

## 2013-10-30 LAB — COMPREHENSIVE METABOLIC PANEL (CC13)
ALK PHOS: 42 U/L (ref 40–150)
ALT: 13 U/L (ref 0–55)
AST: 24 U/L (ref 5–34)
Albumin: 3.6 g/dL (ref 3.5–5.0)
Anion Gap: 8 mEq/L (ref 3–11)
BUN: 25.5 mg/dL (ref 7.0–26.0)
CHLORIDE: 103 meq/L (ref 98–109)
CO2: 30 mEq/L — ABNORMAL HIGH (ref 22–29)
Calcium: 9.4 mg/dL (ref 8.4–10.4)
Creatinine: 0.9 mg/dL (ref 0.6–1.1)
Glucose: 83 mg/dl (ref 70–140)
POTASSIUM: 3.9 meq/L (ref 3.5–5.1)
SODIUM: 141 meq/L (ref 136–145)
Total Bilirubin: 0.21 mg/dL (ref 0.20–1.20)
Total Protein: 6.7 g/dL (ref 6.4–8.3)

## 2013-10-30 LAB — FERRITIN CHCC: FERRITIN: 51 ng/mL (ref 9–269)

## 2013-10-30 LAB — LACTATE DEHYDROGENASE (CC13): LDH: 192 U/L (ref 125–245)

## 2013-10-30 LAB — IRON AND TIBC CHCC
%SAT: 19 % — ABNORMAL LOW (ref 21–57)
IRON: 68 ug/dL (ref 41–142)
TIBC: 364 ug/dL (ref 236–444)
UIBC: 295 ug/dL (ref 120–384)

## 2013-11-01 ENCOUNTER — Other Ambulatory Visit: Payer: Self-pay | Admitting: Oncology

## 2013-11-01 LAB — ANA: Anti Nuclear Antibody(ANA): NEGATIVE

## 2013-11-01 LAB — PROTEIN ELECTROPHORESIS, SERUM
ALBUMIN ELP: 56.9 % (ref 55.8–66.1)
ALPHA-1-GLOBULIN: 7.6 % — AB (ref 2.9–4.9)
Alpha-2-Globulin: 8.8 % (ref 7.1–11.8)
BETA 2: 4.2 % (ref 3.2–6.5)
Beta Globulin: 7.5 % — ABNORMAL HIGH (ref 4.7–7.2)
Gamma Globulin: 15 % (ref 11.1–18.8)
Total Protein, Serum Electrophoresis: 6.6 g/dL (ref 6.0–8.3)

## 2013-11-01 LAB — SEDIMENTATION RATE: Sed Rate: 6 mm/hr (ref 0–22)

## 2013-11-01 LAB — VITAMIN B12: Vitamin B-12: 482 pg/mL (ref 211–911)

## 2013-11-01 LAB — KAPPA/LAMBDA LIGHT CHAINS
KAPPA FREE LGHT CHN: 1.51 mg/dL (ref 0.33–1.94)
KAPPA LAMBDA RATIO: 1.37 (ref 0.26–1.65)
Lambda Free Lght Chn: 1.1 mg/dL (ref 0.57–2.63)

## 2013-11-01 LAB — FOLATE: Folate: >20 ng/mL

## 2013-11-12 ENCOUNTER — Other Ambulatory Visit: Payer: Self-pay | Admitting: Oncology

## 2013-11-12 ENCOUNTER — Ambulatory Visit (HOSPITAL_BASED_OUTPATIENT_CLINIC_OR_DEPARTMENT_OTHER): Payer: Medicare Other | Admitting: Oncology

## 2013-11-12 ENCOUNTER — Telehealth: Payer: Self-pay | Admitting: Oncology

## 2013-11-12 VITALS — BP 104/60 | HR 76 | Temp 98.2°F | Resp 18 | Ht 60.0 in | Wt 111.0 lb

## 2013-11-12 DIAGNOSIS — Z170 Estrogen receptor positive status [ER+]: Secondary | ICD-10-CM

## 2013-11-12 DIAGNOSIS — H9390 Unspecified disorder of ear, unspecified ear: Secondary | ICD-10-CM | POA: Diagnosis not present

## 2013-11-12 DIAGNOSIS — C50112 Malignant neoplasm of central portion of left female breast: Secondary | ICD-10-CM | POA: Diagnosis not present

## 2013-11-12 DIAGNOSIS — M8580 Other specified disorders of bone density and structure, unspecified site: Secondary | ICD-10-CM

## 2013-11-12 DIAGNOSIS — C50411 Malignant neoplasm of upper-outer quadrant of right female breast: Secondary | ICD-10-CM | POA: Diagnosis not present

## 2013-11-12 DIAGNOSIS — C50419 Malignant neoplasm of upper-outer quadrant of unspecified female breast: Secondary | ICD-10-CM

## 2013-11-12 DIAGNOSIS — C50912 Malignant neoplasm of unspecified site of left female breast: Secondary | ICD-10-CM

## 2013-11-12 DIAGNOSIS — D649 Anemia, unspecified: Secondary | ICD-10-CM | POA: Diagnosis not present

## 2013-11-12 DIAGNOSIS — C773 Secondary and unspecified malignant neoplasm of axilla and upper limb lymph nodes: Secondary | ICD-10-CM | POA: Diagnosis not present

## 2013-11-12 MED ORDER — GABAPENTIN 100 MG PO CAPS: 300.0000 mg | ORAL_CAPSULE | Freq: Every day | ORAL | Status: DC

## 2013-11-12 MED ORDER — TAMOXIFEN CITRATE 20 MG PO TABS: ORAL_TABLET | ORAL | Status: DC

## 2013-11-12 NOTE — Progress Notes (Signed)
ID: Kirsten Gibson   DOB: 1945/04/23  MR#: [id number]  ZCH#:[id number]  PCP: Kirsten Coma, MD GYN:  SU: Kirsten Gibson, Kirsten Gibson OTHER OI:NOMVE Kirsten Gibson   HISTORY OF PRESENT ILLNESS: From the original intake note:  She palpated a right breast mass March 2013 and presented for a mammogram. This confirmed the presence of a 1.9 cm mass in the upper outer quadrants. And adjacent mass was noted which was possibly thought to represent a intramammary lymph node. A right axillary lymph node was also noted to be enlarged. Biopsy of the upper outer quadrant mass showed a grade 2 invasive ductal carcinoma which was ER/PR positive HER-2 positive. Ki 67 was 12%. An MRI of the breast was performed 05/10/2011 which showed an overall area of the right breast measuring 5.0 x 2.6 x 2.2 cm of clumped linear enhancement. Majority of this was felt to represent DCIS. A small area of clumped enhancement was noted in the left breast which was not seen on the mammogram. A biopsy of this area was recommended. A biopsy of the right axillary lymph node was also positive for invasive ductal carcinoma.   The patient's subsequent history is as detailed below  INTERVAL HISTORY: [name] returns today for followup of her bilateral breast cancers. Since her last visit here we obtained a CT of the chest to screen for lung cancer since she has a remote but significant smoking history. Luckily this shows no evidence of lung cancer. She also had extensive lab work and today she came with a long list of questions regarding the lab work. The bottom line though is that there is no evidence of myeloma or other major hematologic problem and in fact her hemoglobin is improved and almost normal at this point.  REVIEW OF SYSTEMS: Kirsten Gibson continues to have a throbbing in her ear at night. I have thought this is most likely her pulse but she feels convinced that it really is not that. She has met with  the dietitian here and was told that she needs to be foods with vitamin C instead of taking vitamin C tablets. However there is no reason she cannot do both. She was starting to jog but this got her into a lot of pain in her back and she is now using a treadmill and walking. She finds that the gabapentin at night is helpful but does not completely remove her nighttime hot flashes. She does not want the dose change however. She is tolerating tamoxifen well. A detailed review of systems today was otherwise stable  PAST MEDICAL HISTORY: Past Medical History  Diagnosis Date  . Loose stools   . Frequency   . Anxiety   . TMJ click   . S/P chemotherapy, time since 4-12 weeks   . Rosacea   . Anemia due to chemotherapy 08/23/2011  . Hx of radiation therapy 01/11/12- 02/28/12    right chest wall/supraclav fossa/drain site/scar boost  . Breast cancer     bilateral infiltrating ductal carcinoma  . Mycosis fungoides     follows up with [facility] once to twice per year     PAST SURGICAL HISTORY: Past Surgical History  Procedure Laterality Date  . Cholecystectomy    . Benign tumor removed from neck  1974  . Rt breast biobsy    . Breast biopsy  05/19/11    left  and right breast  . Portacath placement  05/21/2011    Procedure: INSERTION PORT-A-CATH;  Surgeon:  [name], MD;  Location: [facility];  Service: General;  Laterality: Left;  . Abdominal hysterectomy  1988    partial with on ovary remaining.  [name] Biopsy breast      right breast biopsy   . Mastectomy  11/2011    bilat, [place] removal Left 04/06/2012    Procedure: MINOR REMOVAL PORT-A-CATH;  Surgeon: [name], MD;  Location: [facility];  Service: General;  Laterality: Left;    FAMILY HISTORY Family History  Problem Relation Age of Onset  . Cancer Maternal Aunt     breast to brain  . Cancer Paternal Grandmother     breast and/or ovarian///unknown  . Cancer Paternal Grandfather      stomach  . Colon cancer Neg Hx    the patient's father is still living, currently 43 years old. The patient's mother died at the age of 23 from complications of diabetes. The patient had no brothers, 3 sisters. One of the patient's mother is 2 sisters was diagnosed with breast cancer in her late 73s. There is no other history of breast or ovarian cancer in the family. The patient tells me she was evaluated for genetic testing at [facility], but that the insurance would not approve of the test.  GYNECOLOGIC HISTORY: Menarche age 67, she is GX P0. She underwent hysterectomy and unilateral salpingo-oophorectomy in 1988. She took hormone replacement for approximately 3 years.  SOCIAL HISTORY: She used to work at [employer], but is now retired. Her husband, Kirsten Gibson, used to work for the [employer]. He has a son from a prior marriage, and 2 grandchildren. The patient attends [employer].   ADVANCED DIRECTIVES:  patient's husband is her healthcare power of attorney  HEALTH MAINTENANCE: History  Substance Use Topics  . Smoking status: Former Smoker -- 1.00 packs/day for 20 years    Types: Cigarettes    Quit date: 02/08/1986  . Smokeless tobacco: Never Used  . Alcohol Use: No     Colonoscopy:  April 2014, [facility]  PAP:  Bone density: 02/23/2012, osteopenia, [facility]  Lipid panel:  Allergies  Allergen Reactions  . Latex Dermatitis and Rash  . Adhesive [Tape]     ALLERGIC TO OP-SITE......USE OP-SITE FLEXIGRID INSTEAD !!!  . Zofran [Ondansetron Hcl] Swelling    Swelling&redness to face!!  . Diclofenac Other (See Comments)    Fever, chills  . Naproxen Other (See Comments)    Fever, chills    Current Outpatient Prescriptions  Medication Sig Dispense Refill  . calcium carbonate (OS-CAL) 600 MG TABS Take 600 mg by mouth 3 (three) times daily with meals.      . Cholecalciferol (VITAMIN D3 PO) Take 3,000 mg by mouth daily.       [name]  Co-Enzyme Q-10 30 MG CAPS Take by mouth.      . Cyanocobalamin (RA VITAMIN B-12 TR) 1000 MCG TBCR Take by mouth.      . DOXYLAMINE SUCCINATE, SLEEP, PO Take 15 mg by mouth at bedtime.      . gabapentin (NEURONTIN) 100 MG capsule Take 3 capsules (300 mg total) by mouth at bedtime.  270 capsule  3  . Multiple Vitamins-Minerals (MULTIVITAMIN PO) Take 1 capsule by mouth daily.      . Omega 3-6-9 Fatty Acids (OMEGA-3 & OMEGA-6 FISH OIL PO) Take 720 mg by mouth daily.      . Omega-3 Fatty Acids (OMEGA 3 PO) Take 2,200 mg  by mouth daily.      . tamoxifen (NOLVADEX) 20 MG tablet Take one tablet by mouth one time daily  90 tablet  1  . temazepam (RESTORIL) 7.5 MG capsule 1 po q HS and may repeat x 1  180 capsule  3  . Turmeric Curcumin 500 MG CAPS Take 1 capsule by mouth 2 (two) times daily.       No current facility-administered medications for this visit.    OBJECTIVE: Middle-aged white womanwho appears well  Filed Vitals:   11/12/13 0823  BP: 104/60  Pulse: 76  Temp: 98.2 F (36.8 C)  Resp: 18     Body mass index is 21.68 kg/(m^2).    ECOG FS: 0 Filed Weights   11/12/13 0823  Weight: 111 lb (50.349 kg)   Sclerae unicteric,  EOMs intact  Oropharynx clear, teeth in good repair  No cervical or supraclavicular adenopathy Lungs are clear bilaterally with good excursion Heart regular rate and rhythm Abdomen soft, thin, nontender, positive bowel sounds MSK no focal spinal tenderness, no  upper extremity lymphedema  Neuro: nonfocal, well oriented, anxious affect Breasts: Status post bilateral mastectomies. There is no evidence of local recurrence. Both axillae are benign  LAB RESULTS: Lab Results  Component Value Date   WBC 4.4 10/30/2013   NEUTROABS 2.2 10/30/2013   HGB 11.5* 10/30/2013   HCT 34.7* 10/30/2013   MCV 96.7 10/30/2013   PLT 176 10/30/2013      Chemistry      Component Value Date/Time   NA 141 10/30/2013 1008   NA 139 01/28/2012 1636   K 3.9 10/30/2013 1008   K 3.5  01/28/2012 1636   CL 104 06/28/2012 0950   CL 102 01/28/2012 1636   CO2 30* 10/30/2013 1008   CO2 27 01/28/2012 1636   BUN 25.5 10/30/2013 1008   BUN 29* 01/28/2012 1636   CREATININE 0.9 10/30/2013 1008   CREATININE 1.01 01/28/2012 1636      Component Value Date/Time   CALCIUM 9.4 10/30/2013 1008   CALCIUM 9.2 01/28/2012 1636   ALKPHOS 42 10/30/2013 1008   ALKPHOS 84 01/28/2012 1636   AST 24 10/30/2013 1008   AST 23 01/28/2012 1636   ALT 13 10/30/2013 1008   ALT 20 01/28/2012 1636   BILITOT 0.21 10/30/2013 1008   BILITOT 0.2* 01/28/2012 1636      Lab Results  Component Value Date   LABCA2 21 02/18/2012     STUDIES:  Ct Chest Low Dose Screening W/o Cm  10/30/2013   CLINICAL DATA:  History of breast cancer diagnosed in 2013. Remote prior history of smoking.  EXAM: CT CHEST WITHOUT CONTRAST  TECHNIQUE: Multidetector CT imaging of the chest was performed following the standard protocol without intravenous contrast. High resolution imaging of the lungs, as well as inspiratory and expiratory imaging, was performed.  COMPARISON:  Head CT 05/20/2011.  FINDINGS: Mediastinum: Heart size is normal. There is no significant pericardial fluid, thickening or pericardial calcification. No pathologically enlarged mediastinal or hilar lymph nodes. Please note that accurate exclusion of hilar adenopathy is limited on noncontrast CT scans. Esophagus is unremarkable in appearance. Atherosclerotic calcifications in the thoracic aorta.  Lungs/Pleura: There is a cluster of peribronchovascular micro and macronodules in the periphery of the right upper lobe, the largest single nodule of which measures 8 x 6 mm (image 108 of series 5). Within a bronchus leading up to this area there is mucoid impaction, best demonstrated on images 93-94 of series 5. No  other suspicious appearing pulmonary nodules or masses are noted. No acute consolidative airspace disease. No pleural effusions. There is a small amount of subpleural  reticulation in the anterior aspect of the right upper and middle lobes, which is nonspecific, but potentially treatment related, particularly if the patient had prior radiation therapy.  Upper Abdomen: Status post cholecystectomy.  Musculoskeletal: Status post bilateral modified radical mastectomy. There are no aggressive appearing lytic or blastic lesions noted in the visualized portions of the skeleton.  IMPRESSION: 1. There is a cluster of peribronchovascular micro and macronodules in the right upper lobe, which are strongly favored to be benign, likely areas of mucoid impaction within terminal bronchioles. No other suspicious appearing pulmonary nodules or masses are noted. These nodules are new compared to prior study 05/20/2011, and the largest nodule within this region measures 8 x 6 mm. Attention on repeat chest CT in 6 months is recommended. 2. Status post bilateral mastectomy. There is a small amount of subpleural reticulation in the anterior aspect of the right lung, favored to reflect a small amount of subpleural fibrosis from prior radiation therapy.   Electronically Signed   By: Vinnie Langton M.D.   On: 10/30/2013 13:33  .  ASSESSMENT: 69 y.o. [place] woman  (1) status post right breast and axillary lymph node biopsy 05/04/2011, both positive for a grade 2 invasive ductal carcinoma, cT2 pN1 or stage IIB,estrogen receptor 82% and progesterone receptor 92% positive, with an MIB-1 of 12%, and HER-2 amplification by CISH with a ratio of 2.86  (2) biopsy 05/19/2011 of a second right breast area showed low-grade invasive ductal carcinoma, estrogen and progesterone receptor both 100% positive, with an MIB-1 of 13%, and no HER-2 amplification  (3) left breast biopsy 05/19/2011 showed a low-grade invasive ductal carcinoma, e-cadherin positive, HER-2 negative  (4) neoadjuvant treatment consisted of carboplatin/ docetaxel/ trastuzumab x6, started 06/02/2011 and completed 09/29/2011  (5) status  post bilateral mastectomies at [facility] October 2013 showing  (a) on the right, a residual 1.2 cm invasive ductal carcinoma in the breast, with 3 of 19 lymph nodes involved; an additional lymph node showed only isolated tumor cells, so this is a ypT1c ypN1 result  (b) on the left, there was a 1 mm area of residual invasive ductal carcinoma, with all 5 sentinel lymph nodes negative  (6) the patient completed postmastectomy radiation to the right chest wall and right supraclavicular fossa 02/28/2012  (7) status post-op trastuzumab/ pertuzumab x3, discontinued 01/28/2012  (8) trastuzumab continued 02/18/2012, completed April 2014. Final echocardiogram 06/26/2012 showed a well preserved ejection fraction.  (9) tamoxifen started 03/25/2012  (10) osteopenia, with next bone density due February 2016.  (11) history are of greater than 20 pack year smoking, low-dose screening chest CT SEPT 2015 negative  PLAN:  Tawonda is doing very well from a breast cancer point of view. She had many questions and we spent approximately 45 minutes going over these today. I am also reassured that despite her significant smoking history there is no evidence of early lung cancer.  She has very minimal anemia. She has a normal sedimentation rate, no evidence of myeloma, and adequate iron, B12 and folate. Her ferritin has been trending down but remains in the normal level. I have suggested she take some vitamin C daily, which I think will help with her iron absorption. We will continue to monitor this but right now there is no intervention needed.  She continues to have a pulse in her ear or a drowning  in her ear. She feels it really is not her heart pulse but something else. She has previously seen Dr. Erik Obey and she may want to give them a call so he can do a good year exam and make sure there is not some simple explanation if this is indeed not her pulse.  Otherwise she will see me again in 6 months. At that time we  will discuss whether or not she wishes to switch to an aromatase inhibitor so she can complete her followup here at 5 years instead of 10. He would be helpful to have a repeat bone density at that time.  She has a good understanding of the overall plan. She agrees with it. She knows the goal of treatment in her case is cure. She will call with any problems that may develop before next visit here.   Prathik Aman C    11/12/2013

## 2013-11-12 NOTE — Telephone Encounter (Signed)
, °

## 2013-11-14 NOTE — Addendum Note (Signed)
Addended by: Laureen Abrahams on: 11/14/2013 06:13 PM   Modules accepted: Orders, Medications

## 2013-11-15 IMAGING — PT NM PET TUM IMG INITIAL (PI) SKULL BASE T - THIGH
6 series · 25 of 25 positions shown · non-contrast
Comparison: Breast MR of 05/10/2011.

CLINICAL DATA: Initial treatment strategy for cancer, upper outer
quadrant of female breast.  Right breast cancer.

NUCLEAR MEDICINE PET CT SKULL BASE TO THIGH
TECHNIQUE: Technique:  16.1 mCi F-18 FDG was injected
intravenously. CT data was obtained and used for attenuation
correction and anatomic localization only.  (This was not acquired
as a diagnostic CT examination.) Additional exam technical data
entered on technologist worksheet.

[Series 1: pet ac · axial · 3.3mm · 4.69mm/px · z∈[-727,-1]mm · 5 of 223 slices shown]
[im 1/223]
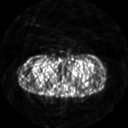
[im 56/223]
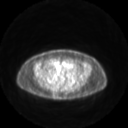
[im 112/223]
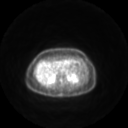
[im 167/223]
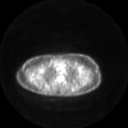
[im 223/223]
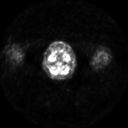

[Series 2: pet nac · axial · 3.3mm · 4.69mm/px · z∈[-727,-1]mm · 6 of 223 slices shown]
[im 1/223]
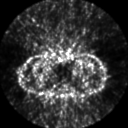
[im 45/223]
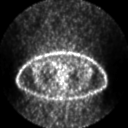
[im 89/223]
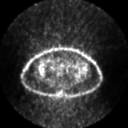
[im 134/223]
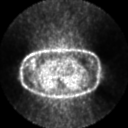
[im 178/223]
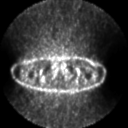
[im 223/223]
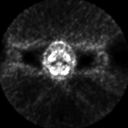

[Series 2: ct images · axial · 3.8mm · 0.98mm/px · z∈[-727,-2]mm · 5 of 223 slices shown]
[im 1/223]
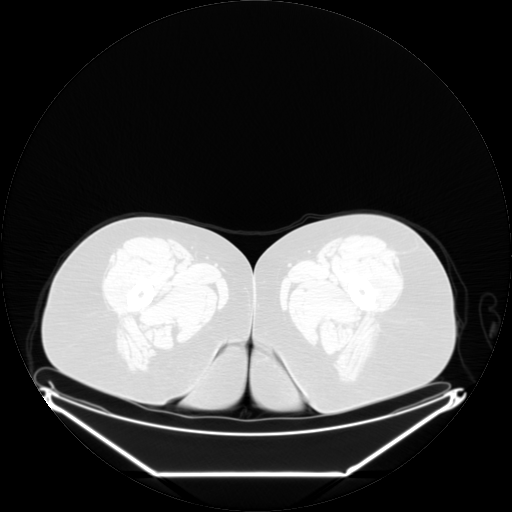
[im 56/223]
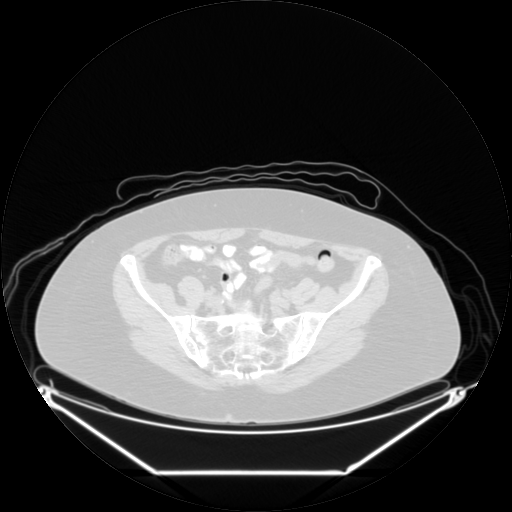
[im 112/223]
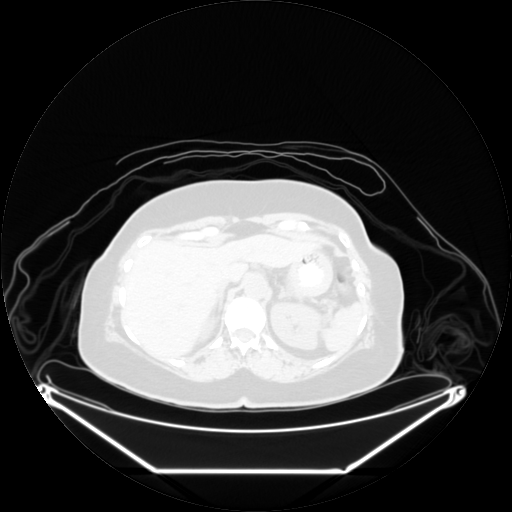
[im 167/223]
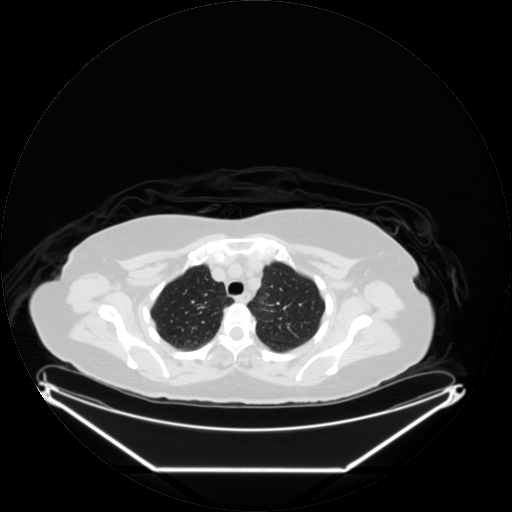
[im 223/223]
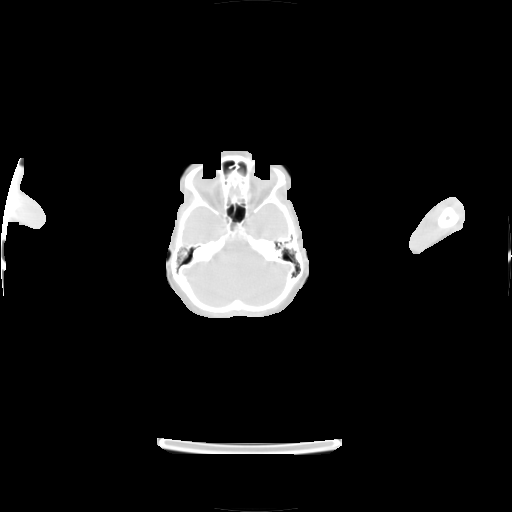

[Series 123: mip · coronal · 3.3mm · 4.69mm/px · 1 of 30 slices shown]
[im 1/30]
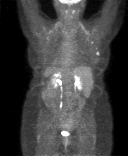

[Series 151: reformatted · axial · 3.3mm · 3.91mm/px · z∈[-727,-1]mm · 6 of 223 slices shown (1 of 2)]
[im 1/223]
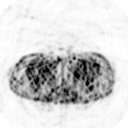
[im 45/223]
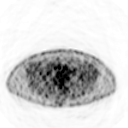
[im 89/223]
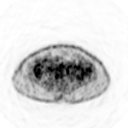
[im 134/223]
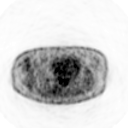
[im 178/223]
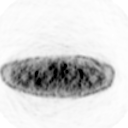
[im 223/223]
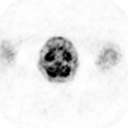

[Series 153: reformatted · coronal · 4.7mm · 5.83mm/px · 2 of 67 slices shown (2 of 2)]
[im 1/67]
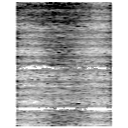
[im 67/67]
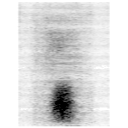

[25 of 25 positions shown; findings below may reference images not displayed]

FINDINGS: Neck: Symmetric palatine tonsil hypermetabolism, likely
physiologic.

Chest:  Low cervical/supraclavicular and high bilateral
axillary/subpectoral hypermetabolic "brown" fat.  This decreases
sensitivity for nodal hypermetabolism in these areas.

Hypermetabolic right axillary nodes.
-More cephalad node measures 9 mm in a S.U.V. max of 6.6 on image
62.
- More inferior and lateral right axillary node measures 1.4 cm and
a S.U.V. max of 3.8 on image 72.

Right breast primary, which measures a S.U.V. max of 8.9 on image
79.

Abdomen/Pelvis:  No abnormal hypermetabolism.

[place]:  No focal hypermetabolic activity to suggest skeletal
metastasis.

CT images performed for attenuation correction demonstrate no
significant findings within the neck.  Borderline cardiomegaly.
Biopsy changes within bilateral breasts.  Interpolar right renal
cyst.  Punctate left renal collecting system calculus.
Hysterectomy.  Mild osteopenia.
IMPRESSION: 1.  Right breast primary with right axillary nodal metastasis.
2.  No extrathoracic hypermetabolic metastasis.
3.  Mild degradation, secondary to hypermetabolic "brown" fat
within the low neck and upper chest.

## 2013-11-19 NOTE — Telephone Encounter (Signed)
NO ENTRY 

## 2013-11-29 DIAGNOSIS — C50912 Malignant neoplasm of unspecified site of left female breast: Secondary | ICD-10-CM | POA: Diagnosis not present

## 2013-11-29 DIAGNOSIS — C50911 Malignant neoplasm of unspecified site of right female breast: Secondary | ICD-10-CM | POA: Diagnosis not present

## 2013-11-29 DIAGNOSIS — Z923 Personal history of irradiation: Secondary | ICD-10-CM | POA: Diagnosis not present

## 2013-11-30 DIAGNOSIS — Z23 Encounter for immunization: Secondary | ICD-10-CM | POA: Diagnosis not present

## 2013-12-10 ENCOUNTER — Ambulatory Visit: Payer: Medicare Other | Admitting: Nurse Practitioner

## 2013-12-10 ENCOUNTER — Other Ambulatory Visit: Payer: Medicare Other

## 2013-12-11 ENCOUNTER — Ambulatory Visit: Payer: Medicare Other

## 2013-12-19 ENCOUNTER — Ambulatory Visit: Payer: Medicare Other | Admitting: Nurse Practitioner

## 2013-12-19 ENCOUNTER — Other Ambulatory Visit: Payer: Medicare Other

## 2013-12-19 DIAGNOSIS — L84 Corns and callosities: Secondary | ICD-10-CM | POA: Diagnosis not present

## 2013-12-19 DIAGNOSIS — L570 Actinic keratosis: Secondary | ICD-10-CM | POA: Diagnosis not present

## 2013-12-19 DIAGNOSIS — L719 Rosacea, unspecified: Secondary | ICD-10-CM | POA: Diagnosis not present

## 2013-12-19 DIAGNOSIS — L821 Other seborrheic keratosis: Secondary | ICD-10-CM | POA: Diagnosis not present

## 2013-12-19 DIAGNOSIS — C8400 Mycosis fungoides, unspecified site: Secondary | ICD-10-CM | POA: Diagnosis not present

## 2013-12-19 DIAGNOSIS — D229 Melanocytic nevi, unspecified: Secondary | ICD-10-CM | POA: Diagnosis not present

## 2014-04-04 ENCOUNTER — Other Ambulatory Visit: Payer: Self-pay | Admitting: Nurse Practitioner

## 2014-05-14 ENCOUNTER — Other Ambulatory Visit (HOSPITAL_BASED_OUTPATIENT_CLINIC_OR_DEPARTMENT_OTHER): Payer: Medicare Other

## 2014-05-14 DIAGNOSIS — C50112 Malignant neoplasm of central portion of left female breast: Secondary | ICD-10-CM | POA: Diagnosis not present

## 2014-05-14 DIAGNOSIS — C50411 Malignant neoplasm of upper-outer quadrant of right female breast: Secondary | ICD-10-CM

## 2014-05-14 DIAGNOSIS — C50912 Malignant neoplasm of unspecified site of left female breast: Secondary | ICD-10-CM

## 2014-05-14 LAB — COMPREHENSIVE METABOLIC PANEL (CC13)
ALK PHOS: 50 U/L (ref 40–150)
ALT: 24 U/L (ref 0–55)
AST: 29 U/L (ref 5–34)
Albumin: 3.7 g/dL (ref 3.5–5.0)
Anion Gap: 8 mEq/L (ref 3–11)
BUN: 28.5 mg/dL — ABNORMAL HIGH (ref 7.0–26.0)
CALCIUM: 9.3 mg/dL (ref 8.4–10.4)
CHLORIDE: 103 meq/L (ref 98–109)
CO2: 28 mEq/L (ref 22–29)
Creatinine: 0.9 mg/dL (ref 0.6–1.1)
EGFR: 71 mL/min/{1.73_m2} — AB (ref >90)
Glucose: 93 mg/dl (ref 70–140)
Potassium: 3.9 mEq/L (ref 3.5–5.1)
Sodium: 139 mEq/L (ref 136–145)
Total Bilirubin: 0.32 mg/dL (ref 0.20–1.20)
Total Protein: 6.6 g/dL (ref 6.4–8.3)

## 2014-05-14 LAB — CBC & DIFF AND RETIC
BASO%: 0.5 % (ref 0.0–2.0)
Basophils Absolute: 0 10*3/uL (ref 0.0–0.1)
EOS%: 1 % (ref 0.0–7.0)
Eosinophils Absolute: 0 10*3/uL (ref 0.0–0.5)
HCT: 36.3 % (ref 34.8–46.6)
HEMOGLOBIN: 12.2 g/dL (ref 11.6–15.9)
Immature Retic Fract: 5.6 % (ref 1.60–10.00)
LYMPH%: 38.1 % (ref 14.0–49.7)
MCH: 32 pg (ref 25.1–34.0)
MCHC: 33.6 g/dL (ref 31.5–36.0)
MCV: 95.3 fL (ref 79.5–101.0)
MONO#: 0.5 10*3/uL (ref 0.1–0.9)
MONO%: 12.9 % (ref 0.0–14.0)
NEUT#: 1.9 10*3/uL (ref 1.5–6.5)
NEUT%: 47.5 % (ref 38.4–76.8)
Platelets: 165 10*3/uL (ref 145–400)
RBC: 3.81 10*6/uL (ref 3.70–5.45)
RDW: 12.7 % (ref 11.2–14.5)
RETIC %: 1.21 % (ref 0.70–2.10)
Retic Ct Abs: 46.1 10*3/uL (ref 33.70–90.70)
WBC: 4 10*3/uL (ref 3.9–10.3)
lymph#: 1.5 10*3/uL (ref 0.9–3.3)

## 2014-05-14 LAB — FERRITIN CHCC: FERRITIN: 60 ng/mL (ref 9–269)

## 2014-05-21 ENCOUNTER — Ambulatory Visit (HOSPITAL_BASED_OUTPATIENT_CLINIC_OR_DEPARTMENT_OTHER): Payer: Medicare Other | Admitting: Oncology

## 2014-05-21 ENCOUNTER — Telehealth: Payer: Self-pay | Admitting: Oncology

## 2014-05-21 ENCOUNTER — Ambulatory Visit (HOSPITAL_COMMUNITY)
Admission: RE | Admit: 2014-05-21 | Discharge: 2014-05-21 | Disposition: A | Discharge status: Home / Self Care | Payer: Medicare Other | Source: Ambulatory Visit | Attending: Oncology | Admitting: Oncology

## 2014-05-21 VITALS — BP 144/77 | HR 67 | Temp 97.7°F | Resp 18 | Ht 60.0 in | Wt 111.9 lb

## 2014-05-21 DIAGNOSIS — C773 Secondary and unspecified malignant neoplasm of axilla and upper limb lymph nodes: Secondary | ICD-10-CM

## 2014-05-21 DIAGNOSIS — Z170 Estrogen receptor positive status [ER+]: Secondary | ICD-10-CM | POA: Diagnosis not present

## 2014-05-21 DIAGNOSIS — C50912 Malignant neoplasm of unspecified site of left female breast: Secondary | ICD-10-CM | POA: Diagnosis not present

## 2014-05-21 DIAGNOSIS — C50112 Malignant neoplasm of central portion of left female breast: Secondary | ICD-10-CM

## 2014-05-21 DIAGNOSIS — K810 Acute cholecystitis: Secondary | ICD-10-CM

## 2014-05-21 DIAGNOSIS — M8580 Other specified disorders of bone density and structure, unspecified site: Secondary | ICD-10-CM

## 2014-05-21 DIAGNOSIS — C50411 Malignant neoplasm of upper-outer quadrant of right female breast: Secondary | ICD-10-CM

## 2014-05-21 DIAGNOSIS — D630 Anemia in neoplastic disease: Secondary | ICD-10-CM

## 2014-05-21 DIAGNOSIS — R918 Other nonspecific abnormal finding of lung field: Secondary | ICD-10-CM | POA: Diagnosis not present

## 2014-05-21 MED ORDER — METRONIDAZOLE 500 MG PO TABS: 2000.0000 mg | ORAL_TABLET | Freq: Once | ORAL | Status: DC

## 2014-05-21 MED ORDER — TAMOXIFEN CITRATE 20 MG PO TABS: ORAL_TABLET | ORAL | Status: DC

## 2014-05-21 NOTE — Telephone Encounter (Signed)
appointments made and avs printed for patient °

## 2014-05-21 NOTE — Progress Notes (Signed)
ID: Kirsten Gibson   DOB: 04-29-1945  MR#: [id number]  ASN#:[id number]  PCP: [name], MD GYN:  SU: [name], [name] OTHER LP:[id number] [name]   HISTORY OF PRESENT ILLNESS: From the original intake note:  She palpated a right breast mass March 2013 and presented for a mammogram. This confirmed the presence of a 1.9 cm mass in the upper outer quadrants. And adjacent mass was noted which was possibly thought to represent a intramammary lymph node. A right axillary lymph node was also noted to be enlarged. Biopsy of the upper outer quadrant mass showed a grade 2 invasive ductal carcinoma which was ER/PR positive HER-2 positive. Ki 67 was 12%. An MRI of the breast was performed 05/10/2011 which showed an overall area of the right breast measuring 5.0 x 2.6 x 2.2 cm of clumped linear enhancement. Majority of this was felt to represent DCIS. A small area of clumped enhancement was noted in the left breast which was not seen on the mammogram. A biopsy of this area was recommended. A biopsy of the right axillary lymph node was also positive for invasive ductal carcinoma.   The patient's subsequent history is as detailed below  INTERVAL HISTORY: Kirsten Gibson returns today for followup of her bilateral breast cancers. She is tolerating tamoxifen with no significant problems with hot flashes. She continues on gabapentin at bedtime. She does have a vaginal discharge, but it is smelly. It is not itchy. From her description it seems more like vaginosis than the type of discharge 1 can get from tamoxifen.  REVIEW OF SYSTEMS: Kirsten Gibson took herself off the temazepam for 3 weeks and then started back at a lower dose with very good results. However she is again taking it every night. She understands she will develop tolerance again if she continues to do that. She is exercising by jogging walking and using a treadmill. She does about 4 miles a day altogether. She has  significant problems with bladder spasms and bladder incontinence. She has seen Dr. McDeirmid in the past and we discussed the possibility of starting myrbetriq, which is something she might want to discuss with him. Otherwise a detailed review of systems today was stable  PAST MEDICAL HISTORY: Past Medical History  Diagnosis Date  . Loose stools   . Frequency   . Anxiety   . TMJ click   . S/P chemotherapy, time since 4-12 weeks   . Rosacea   . Anemia due to chemotherapy 08/23/2011  . Hx of radiation therapy 01/11/12- 02/28/12    right chest wall/supraclav fossa/drain site/scar boost  . Breast cancer     bilateral infiltrating ductal carcinoma  . Mycosis fungoides     follows up with [facility] once to twice per year     PAST SURGICAL HISTORY: Past Surgical History  Procedure Laterality Date  . Cholecystectomy    . Benign tumor removed from neck  1974  . Rt breast biobsy    . Breast biopsy  05/19/11    left  and right breast  . Portacath placement  05/21/2011    Procedure: INSERTION PORT-A-CATH;  Surgeon: [name], MD;  Location: [facility];  Service: General;  Laterality: Left;  . Abdominal hysterectomy  1988    partial with on ovary remaining.  [name] Biopsy breast      right breast biopsy   . Mastectomy  11/2011    bilat, [place] removal Left 04/06/2012  Procedure: MINOR REMOVAL PORT-A-CATH;  Surgeon: [name], MD;  Location: [place];  Service: General;  Laterality: Left;    FAMILY HISTORY Family History  Problem Relation Age of Onset  . Cancer Maternal Aunt     breast to brain  . Cancer Paternal Grandmother     breast and/or ovarian///unknown  . Cancer Paternal Grandfather     stomach  . Colon cancer Neg Hx    the patient's father is still living, currently 55 years old. The patient's mother died at the age of 38 from complications of diabetes. The patient had no brothers, 3 sisters. One of the patient's mother is 2  sisters was diagnosed with breast cancer in her late 65s. There is no other history of breast or ovarian cancer in the family. The patient tells me she was evaluated for genetic testing at [facility], but that the insurance would not approve of the test.  GYNECOLOGIC HISTORY: Menarche age 71, she is GX P0. She underwent hysterectomy and unilateral salpingo-oophorectomy in 1988. She took hormone replacement for approximately 3 years.  SOCIAL HISTORY: She used to work at [employer], but is now retired. Her husband, Kirsten Gibson, used to work for the [employer]. He has a son from a prior marriage, and 2 grandchildren. The patient attends [employer].   ADVANCED DIRECTIVES:  patient's husband is her healthcare power of attorney  HEALTH MAINTENANCE: History  Substance Use Topics  . Smoking status: Former Smoker -- 1.00 packs/day for 20 years    Types: Cigarettes    Quit date: 02/08/1986  . Smokeless tobacco: Never Used  . Alcohol Use: No     Colonoscopy:  April 2014, [facility]  PAP:  Bone density: 02/23/2012, osteopenia, [facility]  Lipid panel:  Allergies  Allergen Reactions  . Latex Dermatitis and Rash  . Adhesive [Tape]     ALLERGIC TO OP-SITE......USE OP-SITE FLEXIGRID INSTEAD !!!  . Zofran [Ondansetron Hcl] Swelling    Swelling&redness to face!!  . Diclofenac Other (See Comments)    Fever, chills  . Naproxen Other (See Comments)    Fever, chills    Current Outpatient Prescriptions  Medication Sig Dispense Refill  . calcium carbonate (OS-CAL) 600 MG TABS Take 600 mg by mouth 3 (three) times daily with meals.    . Cholecalciferol (VITAMIN D3 PO) Take 3,000 mg by mouth daily.     [name] DOXYLAMINE SUCCINATE, SLEEP, PO Take 15 mg by mouth at bedtime.    . gabapentin (NEURONTIN) 100 MG capsule Take 3 capsules (300 mg total) by mouth at bedtime. 270 capsule 3  . Multiple Vitamins-Minerals (MULTIVITAMIN PO) Take 1 capsule by mouth  daily.    . Omega 3-6-9 Fatty Acids (OMEGA-3 & OMEGA-6 FISH OIL PO) Take 720 mg by mouth daily.    . Omega-3 Fatty Acids (OMEGA 3 PO) Take 2,200 mg by mouth daily.    . tamoxifen (NOLVADEX) 20 MG tablet Take one tablet by mouth one time daily 90 tablet 4  . temazepam (RESTORIL) 7.5 MG capsule Take 1-2 capsules by mouth nightly at bedtime as needed for sleep. 180 capsule 0  . Turmeric Curcumin 500 MG CAPS Take 1 capsule by mouth 2 (two) times daily.     No current facility-administered medications for this visit.    OBJECTIVE: Middle-aged white woman in no acute distress Filed Vitals:   05/21/14 1057  BP: 144/77  Pulse: 67  Temp: 97.7 F (36.5 C)  Resp: 18  Body mass index is 21.85 kg/(m^2).    ECOG FS: 0 Filed Weights   05/21/14 1057  Weight: 111 lb 14.4 oz (50.758 kg)   Sclerae unicteric,  pupils round and equal Oropharynx clear and moist No cervical or supraclavicular adenopathy Lungs are clear bilaterally with good excursion Heart regular rate and rhythm Abdomen soft,  nontender, positive bowel sounds MSK no focal spinal tenderness, no upper extremity lymphedema  Neuro: nonfocal, well oriented, appropriate affect Breasts: Status post bilateral mastectomies. There is no evidence of chest wall recurrence. Both axillae are benign  LAB RESULTS: Lab Results  Component Value Date   WBC 4.0 05/14/2014   NEUTROABS 1.9 05/14/2014   HGB 12.2 05/14/2014   HCT 36.3 05/14/2014   MCV 95.3 05/14/2014   PLT 165 05/14/2014      Chemistry      Component Value Date/Time   NA 139 05/14/2014 1028   NA 139 01/28/2012 1636   K 3.9 05/14/2014 1028   K 3.5 01/28/2012 1636   CL 104 06/28/2012 0950   CL 102 01/28/2012 1636   CO2 28 05/14/2014 1028   CO2 27 01/28/2012 1636   BUN 28.5* 05/14/2014 1028   BUN 29* 01/28/2012 1636   CREATININE 0.9 05/14/2014 1028   CREATININE 1.01 01/28/2012 1636      Component Value Date/Time   CALCIUM 9.3 05/14/2014 1028   CALCIUM 9.2 01/28/2012  1636   ALKPHOS 50 05/14/2014 1028   ALKPHOS 84 01/28/2012 1636   AST 29 05/14/2014 1028   AST 23 01/28/2012 1636   ALT 24 05/14/2014 1028   ALT 20 01/28/2012 1636   BILITOT 0.32 05/14/2014 1028   BILITOT 0.2* 01/28/2012 1636      Lab Results  Component Value Date   LABCA2 21 02/18/2012     STUDIES: No results found.   ASSESSMENT: 69 y.o. [place] woman  (1) status post right breast and axillary lymph node biopsy 05/04/2011, both positive for a grade 2 invasive ductal carcinoma, cT2 pN1 or stage IIB,estrogen receptor 82% and progesterone receptor 92% positive, with an MIB-1 of 12%, and HER-2 amplification by CISH with a ratio of 2.86  (2) biopsy 05/19/2011 of a second right breast area showed low-grade invasive ductal carcinoma, estrogen and progesterone receptor both 100% positive, with an MIB-1 of 13%, and no HER-2 amplification  (3) left breast biopsy 05/19/2011 showed a low-grade invasive ductal carcinoma, e-cadherin positive, HER-2 negative  (4) neoadjuvant treatment consisted of carboplatin/ docetaxel/ trastuzumab x6, started 06/02/2011 and completed 09/29/2011  (5) status post bilateral [facility]ctomies at [facility] October 2013 showing  (a) on the right, a residual 1.2 cm invasive ductal carcinoma in the breast, with 3 of 19 lymph nodes involved; an additional lymph node showed only isolated tumor cells, so this is a ypT1c ypN1 result  (b) on the left, there was a 1 mm area of residual invasive ductal carcinoma, with all 5 sentinel lymph nodes negative  (6) the patient completed postmastectomy radiation to the right chest wall and right supraclavicular fossa 02/28/2012  (7) status post-op trastuzumab/ pertuzumab x3, discontinued 01/28/2012  (8) trastuzumab continued 02/18/2012, completed April 2014. Final echocardiogram 06/26/2012 showed a well preserved ejection fraction.  (9) tamoxifen started 03/25/2012  (10) osteopenia, with next bone density due February  2016.  (11) history are of greater than 20 pack year smoking, low-dose screening chest CT SEPT 2015 negative  PLAN:  I spent approximately 50 minutes with Alixandria today going over multiple medical problems. From a breast  cancer point of view Laraine is tolerating tamoxifen well. I don't know if the vaginal discharge she has is really due to tamoxifen or simple vaginosis. I tend to believe it is the latter since it is smelly and the tamoxifen "discharge" is thin and not smelly or itchy. We're going to try a single dose of metronidazole and if it doesn't clear the problem within 2 days she will call her gynecologist.  She continues to have problems with insomnia. She took herself off the temazepam and that recent sit eyes to her, but now she is again taking it every day. We had a long discussion regarding tolerance. I have advised her to use the temazepam no more than 3 times a week and if possible keep it down to 2 times a week. The other nights she can try Benadryl. She is also of course taking gabapentin at bedtime.  She continues to have significant bladder spasm problems. I think she should check with Dr. Vikki Ports regarding the use of myrbetriq.  From a breast cancer point of view she is doing terrific and I am comfortable seeing her on a once a year basis. If she sees me in May, Dr. Cheron Schaumann in September, and Dr. Clovis Riley in January, she will be seeing a physician every 4 months.  She is very nervous about this option. I think she would benefit from our survivorship clinic as well and I am making the referral. Should they should be seeing her sometime in October. They will contact her directly.  She did not get her bone density done and I have put that order back in. There were some abnormalities noted in the CT scan of the chest in September last year which were almost certainly benign. Workup to obtain a chest x-ray to make sure those have cleared . She has a good understanding of the overall plan.  She agrees with it. She knows the goal of treatment in her case is cure. She will call with any problems that may develop before next visit here.   MAGRINAT,GUSTAV C    05/21/2014

## 2014-05-23 ENCOUNTER — Ambulatory Visit
Admission: RE | Admit: 2014-05-23 | Discharge: 2014-05-23 | Disposition: A | Discharge status: Home / Self Care | Payer: Medicare Other | Source: Ambulatory Visit | Attending: Oncology | Admitting: Oncology

## 2014-05-23 DIAGNOSIS — C50411 Malignant neoplasm of upper-outer quadrant of right female breast: Secondary | ICD-10-CM

## 2014-05-23 DIAGNOSIS — M8588 Other specified disorders of bone density and structure, other site: Secondary | ICD-10-CM | POA: Diagnosis not present

## 2014-05-23 DIAGNOSIS — M8580 Other specified disorders of bone density and structure, unspecified site: Secondary | ICD-10-CM

## 2014-05-23 DIAGNOSIS — C50912 Malignant neoplasm of unspecified site of left female breast: Secondary | ICD-10-CM

## 2014-05-23 DIAGNOSIS — M85852 Other specified disorders of bone density and structure, left thigh: Secondary | ICD-10-CM | POA: Diagnosis not present

## 2014-07-23 ENCOUNTER — Telehealth: Payer: Self-pay | Admitting: Adult Health

## 2014-07-23 ENCOUNTER — Telehealth: Payer: Self-pay | Admitting: *Deleted

## 2014-07-23 NOTE — Telephone Encounter (Signed)
I received a message from [place], [place] regarding a phone call from Kirsten Gibson about her pending survivorship visit this Fall.  I called and spoke with the patient and let her know that we are in the process of having a new breast survivorship NP join our team this summer and she will be the NP who will see Kirsten Gibson in October 2016, as requested by Dr. Jana Hakim.  I let Kirsten Gibson know that she would be contacted for her survivorship appointment when our new NP is here and has a schedule available.  I reassured her that we had not forgotten about her and she is on our list to be contacted as soon as our new NP has joined our team.  Kirsten Gibson expressed verbal understanding and agrees with this plan.  She expressed appreciation for my call.  I gave her my direct office number and encouraged her to call me with any additional questions or concerns and I would be happy to help.  Our survivorship team looks forward to participating in her care.   Mike Craze, NP [place] [phone]

## 2014-07-23 NOTE — Telephone Encounter (Signed)
Patient called reporting after the May 21, 2014 visit she was told to expect a call from a NP to be seen and she has not received this call.  Observed plan for survivorship and will notify provider.  "I do not wish to attend a group class as this is depressing.  I also would like to see this person so my visits are six months apart.  Would like to see her in October or even September and see him in April."  Will notify Dr. Jana Hakim and Elzie Rings NP.

## 2014-07-24 NOTE — Telephone Encounter (Signed)
Kirsten Gibson, are we going ahead and scheduling these pts or is it impossible until [place] arrives?  Some are "antsy" w/o a date!

## 2014-07-25 NOTE — Telephone Encounter (Signed)
I called and spoke with Kirsten Gibson yesterday to explain that someone would be in touch with her soon regarding her survivorship appt.  We are working with the Epic team to begin to schedule some of these patients....more to come on this soon. Thanks!  Elzie Rings

## 2014-08-05 ENCOUNTER — Other Ambulatory Visit: Payer: Self-pay

## 2014-08-24 ENCOUNTER — Other Ambulatory Visit: Payer: Self-pay | Admitting: Oncology

## 2014-08-27 ENCOUNTER — Other Ambulatory Visit: Payer: Self-pay | Admitting: *Deleted

## 2014-08-27 NOTE — Telephone Encounter (Signed)
restoril prescription faxed to [facility]

## 2014-08-28 DIAGNOSIS — M79645 Pain in left finger(s): Secondary | ICD-10-CM | POA: Diagnosis not present

## 2014-08-28 DIAGNOSIS — M65312 Trigger thumb, left thumb: Secondary | ICD-10-CM | POA: Diagnosis not present

## 2014-09-18 DIAGNOSIS — M7062 Trochanteric bursitis, left hip: Secondary | ICD-10-CM | POA: Diagnosis not present

## 2014-09-26 DIAGNOSIS — M65312 Trigger thumb, left thumb: Secondary | ICD-10-CM | POA: Diagnosis not present

## 2014-10-08 DIAGNOSIS — M7062 Trochanteric bursitis, left hip: Secondary | ICD-10-CM | POA: Diagnosis not present

## 2014-10-22 ENCOUNTER — Telehealth: Payer: Self-pay | Admitting: Oncology

## 2014-10-22 NOTE — Telephone Encounter (Signed)
Spoke with patient re 10/3 LTS f/u.

## 2014-10-23 DIAGNOSIS — M7062 Trochanteric bursitis, left hip: Secondary | ICD-10-CM | POA: Diagnosis not present

## 2014-10-30 DIAGNOSIS — M7062 Trochanteric bursitis, left hip: Secondary | ICD-10-CM | POA: Diagnosis not present

## 2014-11-06 DIAGNOSIS — M7062 Trochanteric bursitis, left hip: Secondary | ICD-10-CM | POA: Diagnosis not present

## 2014-11-11 ENCOUNTER — Telehealth: Payer: Self-pay | Admitting: Nurse Practitioner

## 2014-11-11 ENCOUNTER — Ambulatory Visit (HOSPITAL_BASED_OUTPATIENT_CLINIC_OR_DEPARTMENT_OTHER): Payer: Medicare Other | Admitting: Nurse Practitioner

## 2014-11-11 ENCOUNTER — Encounter: Payer: Self-pay | Admitting: Nurse Practitioner

## 2014-11-11 VITALS — BP 140/56 | HR 56 | Temp 98.1°F | Resp 18 | Ht 60.0 in | Wt 113.8 lb

## 2014-11-11 DIAGNOSIS — C50411 Malignant neoplasm of upper-outer quadrant of right female breast: Secondary | ICD-10-CM

## 2014-11-11 DIAGNOSIS — C50912 Malignant neoplasm of unspecified site of left female breast: Secondary | ICD-10-CM

## 2014-11-11 DIAGNOSIS — M549 Dorsalgia, unspecified: Secondary | ICD-10-CM

## 2014-11-11 DIAGNOSIS — Z79810 Long term (current) use of selective estrogen receptor modulators (SERMs): Secondary | ICD-10-CM | POA: Diagnosis not present

## 2014-11-11 DIAGNOSIS — C50112 Malignant neoplasm of central portion of left female breast: Secondary | ICD-10-CM

## 2014-11-11 DIAGNOSIS — Z170 Estrogen receptor positive status [ER+]: Secondary | ICD-10-CM | POA: Diagnosis not present

## 2014-11-11 DIAGNOSIS — C773 Secondary and unspecified malignant neoplasm of axilla and upper limb lymph nodes: Secondary | ICD-10-CM

## 2014-11-11 DIAGNOSIS — R197 Diarrhea, unspecified: Secondary | ICD-10-CM

## 2014-11-11 NOTE — Telephone Encounter (Signed)
Scheduled pt for next year for Survivorship per 10/03 POF.... Gave pt AVS and Calendar... KJ
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
## 2014-11-11 NOTE — Patient Instructions (Addendum)
Thank you for coming in today!  As we discussed, please keep a food journal over the next week and stop the smoked gouda and nut products.  Hopefully this will help with your symptoms.  Please follow up with your orthopedic doctor about your back pain.  You will return to see Dr. Jana Hakim in April 2017 with labs the week before hand.  Please continue to your tamoxifen at this time and report any new or worsening side effects.  You will return to see me in October of 2017 and if you note any new symptom or change, please let either Dr. Jana Hakim or myself know so that you can be evaluated.  Continue regular follow up with your primary care provider, Dr. Stephanie Acre.  Below is a list of symptoms to monitor for and report as well as some general health and wellness recommendations.  Please let us know if you have any questions!  Symptoms to Watch for and Report to Your Provider  . Return of the cancer symptoms you had before- such as a lump or new growth where your cancer first started . New or unusual pain that seems unrelated to an injury and does not go away, including back pain or bone pain . Weight loss without trying/intending . Unexplained bleeding . A rash or allergic reaction, such as swelling, severe itching or wheezing . Chills or fevers . Persistent headaches . Shortness of breath or difficulty breathing . Bloody stools or blood in your urine . Lumps, bumps, swelling and/or nipple discharge . Nausea, vomiting, diarrhea, loss of appetite, or trouble swallowing . A cough that doesn't go away . Abdominal pain . Swelling in your arms or legs . Fractures . Hot flashes or other menopausal symptoms . Any other signs mentioned by your doctor or nurse or any unusual symptoms                 that you just can't explain   NOTE: Just because you have certain symptoms, it doesn't mean the cancer has come back or you have a new cancer. Symptoms can be due to other problems that need to be addressed.  It  is important to watch for these symptoms and report them to your provider so you can be medically evaluated for any of these concerns!    Living a Life of Wellness After Cancer:  *Note: Please consult your health care provider before using any medications, supplements, over-the-counter products, or other interventions.  Also, please consult your primary care provider before you begin any lifestyle program (diet, exercise, etc.).  Your safety is our top priority and we want to make sure you continue to live a long and healthy life!    Healthy Lifestyle Recommendations  As a cancer survivor, it is important develop a lifelong commitment to a healthy lifestyle. A healthy lifestyle can prevent cancer from returning as well as prevent other diseases like heart disease, diabetes and high blood pressure.  These are some things that you can do to have a healthy lifestyle:  [name] Maintain a healthy weight.  . Exercise daily per your doctor's orders. . Eat a balanced diet high in fruits, vegetables, bran, and fiber. . Limit how much alcohol you consume, if at all. Ali Lowe regular bone mineral density testing for osteoporosis.  . Talk to your doctor about cardiovascular disease or "heart disease" screening. . Stop smoking (if you smoke). . Know your family history. . Be mindful of your emotional, social, and spiritual needs. [name]  Meet regularly with a Primary Care Provider (PCP). Find a PCP if you do not already have one. . Talk to your doctor about regular cancer screening.

## 2014-11-14 DIAGNOSIS — N390 Urinary tract infection, site not specified: Secondary | ICD-10-CM | POA: Diagnosis not present

## 2014-11-14 DIAGNOSIS — R109 Unspecified abdominal pain: Secondary | ICD-10-CM | POA: Diagnosis not present

## 2014-11-15 DIAGNOSIS — R109 Unspecified abdominal pain: Secondary | ICD-10-CM | POA: Diagnosis not present

## 2014-11-19 ENCOUNTER — Other Ambulatory Visit: Payer: Self-pay | Admitting: Oncology

## 2014-11-19 DIAGNOSIS — Z23 Encounter for immunization: Secondary | ICD-10-CM | POA: Diagnosis not present

## 2014-11-20 DIAGNOSIS — M7062 Trochanteric bursitis, left hip: Secondary | ICD-10-CM | POA: Diagnosis not present

## 2014-11-22 ENCOUNTER — Telehealth: Payer: Self-pay | Admitting: Nurse Practitioner

## 2014-11-22 NOTE — Telephone Encounter (Signed)
Received phone message from patient asking for return call. Returned call to patient; had questions related to clinical trial presented at [facility] presentation (PALLAS study).  Provided Kirsten Gibson with information about the study including eligibility (she is not eligible). Checked on symptoms related to diarrhea.  Continuing with intermittent episodes; seen by PCP who ordered a series of tests that are negative thus far. May proceed with GI referral (which we support).  Will await further updates and reinforced that we are here for patient pending her needs.

## 2014-11-27 DIAGNOSIS — M7062 Trochanteric bursitis, left hip: Secondary | ICD-10-CM | POA: Diagnosis not present

## 2014-12-05 DIAGNOSIS — Z923 Personal history of irradiation: Secondary | ICD-10-CM | POA: Diagnosis not present

## 2014-12-05 DIAGNOSIS — C50911 Malignant neoplasm of unspecified site of right female breast: Secondary | ICD-10-CM | POA: Diagnosis not present

## 2014-12-05 DIAGNOSIS — C50912 Malignant neoplasm of unspecified site of left female breast: Secondary | ICD-10-CM | POA: Diagnosis not present

## 2014-12-25 DIAGNOSIS — L820 Inflamed seborrheic keratosis: Secondary | ICD-10-CM | POA: Diagnosis not present

## 2014-12-25 DIAGNOSIS — L719 Rosacea, unspecified: Secondary | ICD-10-CM | POA: Diagnosis not present

## 2014-12-25 DIAGNOSIS — D225 Melanocytic nevi of trunk: Secondary | ICD-10-CM | POA: Diagnosis not present

## 2014-12-25 DIAGNOSIS — L821 Other seborrheic keratosis: Secondary | ICD-10-CM | POA: Diagnosis not present

## 2014-12-25 DIAGNOSIS — C8400 Mycosis fungoides, unspecified site: Secondary | ICD-10-CM | POA: Diagnosis not present

## 2014-12-25 DIAGNOSIS — R238 Other skin changes: Secondary | ICD-10-CM | POA: Diagnosis not present

## 2014-12-25 DIAGNOSIS — D485 Neoplasm of uncertain behavior of skin: Secondary | ICD-10-CM | POA: Diagnosis not present

## 2015-02-17 DIAGNOSIS — Q103 Other congenital malformations of eyelid: Secondary | ICD-10-CM | POA: Diagnosis not present

## 2015-02-17 DIAGNOSIS — H11151 Pinguecula, right eye: Secondary | ICD-10-CM | POA: Diagnosis not present

## 2015-02-17 DIAGNOSIS — H01003 Unspecified blepharitis right eye, unspecified eyelid: Secondary | ICD-10-CM | POA: Diagnosis not present

## 2015-02-17 DIAGNOSIS — H02053 Trichiasis without entropian right eye, unspecified eyelid: Secondary | ICD-10-CM | POA: Diagnosis not present

## 2015-02-24 DIAGNOSIS — M899 Disorder of bone, unspecified: Secondary | ICD-10-CM | POA: Diagnosis not present

## 2015-02-24 DIAGNOSIS — Z0000 Encounter for general adult medical examination without abnormal findings: Secondary | ICD-10-CM | POA: Diagnosis not present

## 2015-02-24 DIAGNOSIS — Z79899 Other long term (current) drug therapy: Secondary | ICD-10-CM | POA: Diagnosis not present

## 2015-02-24 DIAGNOSIS — E7800 Pure hypercholesterolemia, unspecified: Secondary | ICD-10-CM | POA: Diagnosis not present

## 2015-03-06 DIAGNOSIS — F419 Anxiety disorder, unspecified: Secondary | ICD-10-CM | POA: Diagnosis not present

## 2015-03-06 DIAGNOSIS — C50919 Malignant neoplasm of unspecified site of unspecified female breast: Secondary | ICD-10-CM | POA: Diagnosis not present

## 2015-03-06 DIAGNOSIS — Z23 Encounter for immunization: Secondary | ICD-10-CM | POA: Diagnosis not present

## 2015-03-06 DIAGNOSIS — Z0000 Encounter for general adult medical examination without abnormal findings: Secondary | ICD-10-CM | POA: Diagnosis not present

## 2015-03-06 DIAGNOSIS — G620 Drug-induced polyneuropathy: Secondary | ICD-10-CM | POA: Diagnosis not present

## 2015-03-06 DIAGNOSIS — G479 Sleep disorder, unspecified: Secondary | ICD-10-CM | POA: Diagnosis not present

## 2015-04-14 DIAGNOSIS — H2513 Age-related nuclear cataract, bilateral: Secondary | ICD-10-CM | POA: Diagnosis not present

## 2015-04-14 DIAGNOSIS — H43811 Vitreous degeneration, right eye: Secondary | ICD-10-CM | POA: Diagnosis not present

## 2015-04-14 DIAGNOSIS — H25013 Cortical age-related cataract, bilateral: Secondary | ICD-10-CM | POA: Diagnosis not present

## 2015-04-14 DIAGNOSIS — D3132 Benign neoplasm of left choroid: Secondary | ICD-10-CM | POA: Diagnosis not present

## 2015-05-26 ENCOUNTER — Other Ambulatory Visit: Payer: Self-pay

## 2015-05-26 DIAGNOSIS — C50912 Malignant neoplasm of unspecified site of left female breast: Secondary | ICD-10-CM

## 2015-05-26 DIAGNOSIS — C50411 Malignant neoplasm of upper-outer quadrant of right female breast: Secondary | ICD-10-CM

## 2015-05-27 ENCOUNTER — Other Ambulatory Visit (HOSPITAL_BASED_OUTPATIENT_CLINIC_OR_DEPARTMENT_OTHER): Payer: Medicare Other

## 2015-05-27 DIAGNOSIS — C50411 Malignant neoplasm of upper-outer quadrant of right female breast: Secondary | ICD-10-CM

## 2015-05-27 DIAGNOSIS — C50912 Malignant neoplasm of unspecified site of left female breast: Secondary | ICD-10-CM

## 2015-05-27 LAB — COMPREHENSIVE METABOLIC PANEL
ALT: 19 U/L (ref 0–55)
AST: 27 U/L (ref 5–34)
Albumin: 3.6 g/dL (ref 3.5–5.0)
Alkaline Phosphatase: 60 U/L (ref 40–150)
Anion Gap: 8 mEq/L (ref 3–11)
BUN: 24 mg/dL (ref 7.0–26.0)
CALCIUM: 9.1 mg/dL (ref 8.4–10.4)
CHLORIDE: 106 meq/L (ref 98–109)
CO2: 26 mEq/L (ref 22–29)
Creatinine: 0.9 mg/dL (ref 0.6–1.1)
EGFR: 69 mL/min/{1.73_m2} — AB (ref >90)
Glucose: 81 mg/dl (ref 70–140)
POTASSIUM: 4.3 meq/L (ref 3.5–5.1)
SODIUM: 140 meq/L (ref 136–145)
Total Bilirubin: <0.3 mg/dL (ref 0.20–1.20)
Total Protein: 6.7 g/dL (ref 6.4–8.3)

## 2015-05-27 LAB — CBC WITH DIFFERENTIAL/PLATELET
BASO%: 0.9 % (ref 0.0–2.0)
BASOS ABS: 0 10*3/uL (ref 0.0–0.1)
EOS%: 1.5 % (ref 0.0–7.0)
Eosinophils Absolute: 0.1 10*3/uL (ref 0.0–0.5)
HCT: 37.8 % (ref 34.8–46.6)
HGB: 12.6 g/dL (ref 11.6–15.9)
LYMPH%: 38.4 % (ref 14.0–49.7)
MCH: 31.4 pg (ref 25.1–34.0)
MCHC: 33.3 g/dL (ref 31.5–36.0)
MCV: 94.3 fL (ref 79.5–101.0)
MONO#: 0.5 10*3/uL (ref 0.1–0.9)
MONO%: 10.8 % (ref 0.0–14.0)
NEUT#: 2.2 10*3/uL (ref 1.5–6.5)
NEUT%: 48.4 % (ref 38.4–76.8)
Platelets: 189 10*3/uL (ref 145–400)
RBC: 4.01 10*6/uL (ref 3.70–5.45)
RDW: 12.6 % (ref 11.2–14.5)
WBC: 4.6 10*3/uL (ref 3.9–10.3)
lymph#: 1.8 10*3/uL (ref 0.9–3.3)

## 2015-05-28 ENCOUNTER — Other Ambulatory Visit: Payer: Self-pay | Admitting: Oncology

## 2015-05-30 ENCOUNTER — Other Ambulatory Visit: Payer: Self-pay

## 2015-05-30 DIAGNOSIS — C50411 Malignant neoplasm of upper-outer quadrant of right female breast: Secondary | ICD-10-CM

## 2015-05-30 MED ORDER — TAMOXIFEN CITRATE 20 MG PO TABS: ORAL_TABLET | ORAL | Status: DC

## 2015-05-30 MED ORDER — TEMAZEPAM 7.5 MG PO CAPS: ORAL_CAPSULE | ORAL | Status: DC

## 2015-06-03 ENCOUNTER — Telehealth: Payer: Self-pay | Admitting: Oncology

## 2015-06-03 ENCOUNTER — Ambulatory Visit (HOSPITAL_BASED_OUTPATIENT_CLINIC_OR_DEPARTMENT_OTHER): Payer: Medicare Other | Admitting: Oncology

## 2015-06-03 VITALS — BP 140/75 | HR 63 | Temp 98.2°F | Resp 18 | Ht 60.0 in | Wt 114.8 lb

## 2015-06-03 DIAGNOSIS — C50411 Malignant neoplasm of upper-outer quadrant of right female breast: Secondary | ICD-10-CM

## 2015-06-03 DIAGNOSIS — C50912 Malignant neoplasm of unspecified site of left female breast: Secondary | ICD-10-CM

## 2015-06-03 DIAGNOSIS — C773 Secondary and unspecified malignant neoplasm of axilla and upper limb lymph nodes: Secondary | ICD-10-CM | POA: Diagnosis not present

## 2015-06-03 DIAGNOSIS — G4700 Insomnia, unspecified: Secondary | ICD-10-CM | POA: Diagnosis not present

## 2015-06-03 DIAGNOSIS — C50812 Malignant neoplasm of overlapping sites of left female breast: Secondary | ICD-10-CM

## 2015-06-03 MED ORDER — TRAZODONE HCL 50 MG PO TABS: 50.0000 mg | ORAL_TABLET | Freq: Every day | ORAL | Status: DC

## 2015-06-03 MED ORDER — ZOLPIDEM TARTRATE 5 MG PO TABS: 5.0000 mg | ORAL_TABLET | Freq: Every evening | ORAL | Status: DC | PRN

## 2015-06-03 NOTE — Telephone Encounter (Signed)
appt made and avs printed °
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
## 2015-06-17 ENCOUNTER — Telehealth: Payer: Self-pay | Admitting: *Deleted

## 2015-06-17 NOTE — Telephone Encounter (Signed)
"  I was seen recently but my right arm is swelling.  I've been working in the yard, digging and maybe shouldn't do this but my pinky and ring fingers tingle.  Rt. Arm feels slightly warmer than left.  I think I may have lymphedema.  The PT department on [address] needs an order.  I want to make sure they know what they're doing so It doesn't get worse.  I have scoliosis that makes my right shoulder and arm hang lower so this arm looks different."  Return number [phone]. "

## 2015-06-18 ENCOUNTER — Other Ambulatory Visit: Payer: Self-pay | Admitting: *Deleted

## 2015-06-18 DIAGNOSIS — C50812 Malignant neoplasm of overlapping sites of left female breast: Secondary | ICD-10-CM

## 2015-06-18 DIAGNOSIS — I890 Lymphedema, not elsewhere classified: Secondary | ICD-10-CM

## 2015-06-18 DIAGNOSIS — C50411 Malignant neoplasm of upper-outer quadrant of right female breast: Secondary | ICD-10-CM

## 2015-06-18 NOTE — Telephone Encounter (Signed)
This RN spoke with pt per her concerns- discussed onset post activity in relation in arm affected had 19 lymph nodes removed as well as scoliosis.  Per discussion pt understands ideal to be seen by the lymphededema clinic for assessment and therapy.

## 2015-06-24 ENCOUNTER — Ambulatory Visit: Payer: Medicare Other | Admitting: Physical Therapy

## 2015-06-24 ENCOUNTER — Ambulatory Visit: Payer: Medicare Other | Attending: Oncology | Admitting: Physical Therapy

## 2015-06-24 ENCOUNTER — Encounter: Payer: Self-pay | Admitting: Physical Therapy

## 2015-06-24 DIAGNOSIS — I890 Lymphedema, not elsewhere classified: Secondary | ICD-10-CM | POA: Insufficient documentation

## 2015-06-24 NOTE — Therapy (Addendum)
[place], [place], [address] Phone: [phone]   Fax:  [phone]  Physical Therapy Evaluation  Patient Details  Name: Kirsten Gibson MRN: [id number] Date of Birth: 07/26/1945 Referring Provider: Magrinat  Encounter Date: 06/24/2015      PT End of Session - 06/24/15 1202    Visit Number 1   Number of Visits 9   Date for PT Re-Evaluation 08/12/15   PT Start Time 1106   PT Stop Time 1150   PT Time Calculation (min) 44 min   Activity Tolerance Patient tolerated treatment well   Behavior During Therapy [facility] for tasks assessed/performed      Past Medical History  Diagnosis Date  . Loose stools   . Frequency   . Anxiety   . TMJ click   . S/P chemotherapy, time since 4-12 weeks   . Rosacea   . Anemia due to chemotherapy 08/23/2011  . Hx of radiation therapy 01/11/12- 02/28/12    right chest wall/supraclav fossa/drain site/scar boost  . Breast cancer ([place])     bilateral infiltrating ductal carcinoma  . Mycosis fungoides     follows up with [facility] once to twice per year     Past Surgical History  Procedure Laterality Date  . Cholecystectomy    . Benign tumor removed from neck  1974  . Rt breast biobsy    . Breast biopsy  05/19/11    left  and right breast  . Portacath placement  05/21/2011    Procedure: INSERTION PORT-A-CATH;  Surgeon: [name], MD;  Location: [facility];  Service: General;  Laterality: Left;  . Abdominal hysterectomy  1988    partial with on ovary remaining.  [name] Biopsy breast      right breast biopsy   . Mastectomy  11/2011    bilat, [place] removal Left 04/06/2012    Procedure: MINOR REMOVAL PORT-A-CATH;  Surgeon: [name], MD;  Location: [facility];  Service: General;  Laterality: Left;    There were no vitals filed for this visit.       Subjective Assessment - 06/24/15 1108    Subjective Pt had breast cancer on  L and R side and underwent bilateral mastectomy as well as 5 lymph nodes taken out in L arm and 19 taken out on R side. Pt had been out digging in the yard and felt like she had a hair wrapped around her pinky and ring finger on the right side and then realized that it was tingling. Pt states she got poked with a metal object on her left upper extremity and it became swollen. She continued digging in the yard and then she noticed swelling on her right forearm. That night is when pt noticed tingling in the right fingers.    Pertinent History Biopsy of the upper outer quadrant mass showed a grade 2 invasive ductal carcinoma which was ER/PR positive HER-2 positive. Ki 67 was 12%. An MRI of the breast was performed 05/10/2011 which showed an overall area of the right breast measuring 5.0 x 2.6 x 2.2 cm of clumped linear enhancement. Majority of this was felt to represent DCIS. A small area of clumped enhancement was noted in the left breast which was not seen on the mammogram. A biopsy of this area was recommended. A biopsy of the right axillary lymph node was also positive for invasive ductal carcinoma. Pt underwent bilateral mastectomy, 5 nodes  removed on L and 19 on R, completed chemotherapy and radiation and is currently taking Tamoxifen   Patient Stated Goals to not get lymphedema   Currently in Pain? No/denies   Pain Score 0-No pain            [facility] PT Assessment - 06/24/15 0001    Assessment   Medical Diagnosis right and left breast cancer   Referring Provider Magrinat   Onset Date/Surgical Date 11/09/11   Hand Dominance Right   Prior Therapy physical therapy for back this year following rough plane landing   Precautions   Precautions Other (comment)  lymphedema   Restrictions   Weight Bearing Restrictions No   Balance Screen   Has the patient fallen in the past 6 months No   Has the patient had a decrease in activity level because of a fear of falling?  No   Is the patient reluctant to  leave their home because of a fear of falling?  No   Home Environment   Living Environment Private residence   Living Arrangements Spouse/significant other   Available Help at Discharge Family   Type of [place] to enter   Entrance Stairs-Number of Steps 2   Entrance Stairs-Rails Left   [place] Two level   Prior Function   Level of [place] Retired   Leisure pt uses treadmill 5 days/wk, 25 min, pt very active in the yard as well   Cognition   Overall Cognitive Status Within Functional Limits for tasks assessed   AROM   Overall AROM  Within functional limits for tasks performed   Strength   Overall Strength Within functional limits for tasks performed           LYMPHEDEMA/ONCOLOGY QUESTIONNAIRE - 06/24/15 1130    Type   Cancer Type right and left breast cancer   Surgeries   Mastectomy Date 11/09/11   Sentinel Lymph Node Biopsy Date 11/09/11  5 on left   Axillary Lymph Node Dissection Date 11/09/11  19 on right   Number Lymph Nodes Removed --  5 on L and 19 on R   Date Lymphedema/Swelling Started   Date 06/16/15   Treatment   Active Chemotherapy Treatment No   Past Chemotherapy Treatment Yes   Date --  pt reports she completed chemo around 2013   Active Radiation Treatment No   Past Radiation Treatment Yes   Date --  pt completed in 2013   Body Site right axilla and breast   Current Hormone Treatment Yes   Drug Name tamoxifen   Past Hormone Therapy No   What other symptoms do you have   Are you Having Heaviness or Tightness Yes   Are you having Pain No   Are you having pitting edema No   Is it Hard or Difficult finding clothes that fit No   Do you have infections No   Is there Decreased scar mobility No   Lymphedema Assessments   Lymphedema Assessments Upper extremities   Right Upper Extremity Lymphedema   15 cm Proximal to Olecranon Process 28.3 cm   Olecranon Process 22 cm   15 cm Proximal to Ulnar  Styloid Process 20.9 cm   Just Proximal to Ulnar Styloid Process 13.1 cm   Across Hand at [employer] 16.2 cm   At [place] of 2nd Digit 5.4 cm   Left Upper Extremity Lymphedema   15 cm Proximal to Olecranon Process  28.5 cm   Olecranon Process 22 cm   15 cm Proximal to Ulnar Styloid Process 20.7 cm   Just Proximal to Ulnar Styloid Process 13.3 cm   Across Hand at [employer] 16 cm   At [place] of 2nd Digit 5.4 cm           Quick Dash - 06/24/15 0001    Open a tight or new jar Moderate difficulty   Do heavy household chores (wash walls, wash floors) No difficulty   Carry a shopping bag or briefcase No difficulty   Wash your back No difficulty   Use a knife to cut food No difficulty   Recreational activities in which you take some force or impact through your arm, shoulder, or hand (golf, hammering, tennis) No difficulty   During the past week, to what extent has your arm, shoulder or hand problem interfered with your normal social activities with family, friends, neighbors, or groups? Not at all   During the past week, to what extent has your arm, shoulder or hand problem limited your work or other regular daily activities [place]   Arm, shoulder, or hand pain. Severe   Tingling (pins and needles) in your arm, shoulder, or hand Mild   Difficulty Sleeping Mild difficulty   DASH Score 20.45 %                     PT Education - 06/24/15 1213    Education provided Yes   Education Details lymphedema risk reduction practices   Person(s) Educated Patient   Methods Explanation;Handout   Comprehension Verbalized understanding                [facility] Goals - 06/24/15 1213    CC Long Term Goal  #1   Title Pt will be independent in self manual lymphatic drainage for long term management of edema.    Time 4   Period Weeks   Status New   CC Long Term Goal  #2   Title Pt will obtain appropriate compression garments for long term management of edema.     Time 4   Period Weeks   Status New            Plan - 06/24/15 1203    Clinical Impression Statement Pt presents to PT for evaluation of possible bilateral UE lymphedema. She states she was doing a lot of digging in the yard and had tingling in her right ring and pinky finger. She also got cut on her left forearm and had some swelling. Today there is no difference between L and R UE circumferential measurements. No swelled can be visualized. Pt has good scar mobility bilaterally. Her ROM is normal and her strength in LUE is 5/5 throughout and in R UE is 5/5 throughout but 4+/5 for flexion and abduction. Pt was educated in lymphedema risk reduction practices and given a handout. She was also given information about the [place] program and the [facility]. Pt to be placed on hold for 3 weeks to see if she develops any symptoms of lymphedema and if she continues to feel heaviness pt will be instructed in self manual lymphatic drainage.    Rehab Potential Good   Clinical Impairments Affecting Rehab Potential none   PT Frequency 2x / week   PT Duration 4 weeks  pt will be on hold for 3 wks, if she has symptoms after 3 week she will begin PT   PT Treatment/Interventions  Manual techniques;Manual lymph drainage;Taping;ADLs/Self Care Home Management;Compression bandaging;Therapeutic exercise   PT Next Visit Plan begin instructing in self MLD   Consulted and Agree with Plan of Care Patient      Patient will benefit from skilled therapeutic intervention in order to improve the following deficits and impairments:  Increased edema  Visit Diagnosis: Lymphedema, not elsewhere classified - Plan: PT plan of care cert/re-cert      G-Codes - 30/07/62 1215    Functional Assessment Tool Used Quick Dash   Functional Limitation Carrying, moving and handling objects   Carrying, Moving and Handling Objects Current Status [phone]) At least 20 percent but less than 40 percent impaired, limited or restricted  pt  rated on when she was having difficulty after digging in the garden, no impairments found today in eval   Carrying, Moving and Handling Objects Goal Status (L4562) At least 20 percent but less than 40 percent impaired, limited or restricted       Problem List Patient Active Problem List   Diagnosis Date Noted  . Unspecified deficiency anemia 10/30/2013  . Anemia, unspecified 09/28/2012  . White coat syndrome without hypertension 06/26/2012  . Pain of left calf 06/26/2012  . Rectal bleeding 04/17/2012  . Anxiety   . TMJ click   . S/P chemotherapy, time since 4-12 weeks   . Rosacea   . Hx of radiation therapy   . Anemia in neoplastic disease 08/23/2011  . Acute cholecystitis 08/13/2011  . Breast cancer, left breast ([place]) 05/19/2011  . Primary cancer of upper outer quadrant of right female breast ([place]) 05/06/2011    Alexia Freestone 06/24/2015, 12:21 PM  [place], [place], [address] Phone: [phone]   Fax:  [phone]  Name: ZARAYAH LANTING MRN: [id number] Date of Birth: [birth year]   Allyson Sabal, PT 06/24/2015 12:21 PM  PHYSICAL THERAPY DISCHARGE SUMMARY  Visits from Start of Care: 1  Current functional level related to goals / functional outcomes: See above- pt did not return for any follow up appointments   Remaining deficits: See above  Education / Equipment: See above Plan: Patient agrees to discharge.  Patient goals were not met. Patient is being discharged due to not returning since the last visit.  ?????    Allyson Sabal, PT 12/30/15 8:19 AM

## 2015-06-26 DIAGNOSIS — L720 Epidermal cyst: Secondary | ICD-10-CM | POA: Diagnosis not present

## 2015-06-26 DIAGNOSIS — L821 Other seborrheic keratosis: Secondary | ICD-10-CM | POA: Diagnosis not present

## 2015-06-26 DIAGNOSIS — D485 Neoplasm of uncertain behavior of skin: Secondary | ICD-10-CM | POA: Diagnosis not present

## 2015-06-27 ENCOUNTER — Telehealth: Payer: Self-pay | Admitting: *Deleted

## 2015-06-27 ENCOUNTER — Other Ambulatory Visit: Payer: Self-pay | Admitting: Oncology

## 2015-06-27 DIAGNOSIS — F419 Anxiety disorder, unspecified: Secondary | ICD-10-CM

## 2015-06-27 DIAGNOSIS — C50411 Malignant neoplasm of upper-outer quadrant of right female breast: Secondary | ICD-10-CM

## 2015-06-27 NOTE — Telephone Encounter (Signed)
Patient called wanting to know if Trazadone could be refilled but with different directions. She would like Dr. Jana Hakim to order it this way "take 1 tablet at bedtime and may repeat x1 if needed". Advised patient that Dr. Jana Hakim is out of the office and will return on Monday. Advised that I would leave a message for him and his nurse. Advised to call Monday afternoon if no word from pharmacy. She wants to wait until Dr. Jana Hakim is back in the office and declined to have it refilled at this time.

## 2015-06-30 ENCOUNTER — Other Ambulatory Visit: Payer: Self-pay | Admitting: Oncology

## 2015-06-30 MED ORDER — TRAZODONE HCL 50 MG PO TABS: 50.0000 mg | ORAL_TABLET | Freq: Every evening | ORAL | Status: DC | PRN

## 2015-06-30 NOTE — Addendum Note (Signed)
Addended by: Lujean Amel on: 06/30/2015 04:41 PM   Modules accepted: Orders

## 2015-07-01 ENCOUNTER — Other Ambulatory Visit: Payer: Self-pay

## 2015-07-01 DIAGNOSIS — F419 Anxiety disorder, unspecified: Secondary | ICD-10-CM

## 2015-07-01 DIAGNOSIS — C50411 Malignant neoplasm of upper-outer quadrant of right female breast: Secondary | ICD-10-CM

## 2015-07-01 MED ORDER — ZOLPIDEM TARTRATE 5 MG PO TABS: ORAL_TABLET | ORAL | Status: DC

## 2015-07-30 ENCOUNTER — Other Ambulatory Visit: Payer: Self-pay | Admitting: Oncology

## 2015-08-01 ENCOUNTER — Other Ambulatory Visit: Payer: Self-pay | Admitting: *Deleted

## 2015-08-01 DIAGNOSIS — F419 Anxiety disorder, unspecified: Secondary | ICD-10-CM

## 2015-08-01 DIAGNOSIS — C50411 Malignant neoplasm of upper-outer quadrant of right female breast: Secondary | ICD-10-CM

## 2015-08-01 MED ORDER — ZOLPIDEM TARTRATE 5 MG PO TABS: ORAL_TABLET | ORAL | Status: DC

## 2015-08-01 MED ORDER — GABAPENTIN 300 MG PO CAPS: 300.0000 mg | ORAL_CAPSULE | Freq: Three times a day (TID) | ORAL | Status: DC

## 2015-08-06 ENCOUNTER — Other Ambulatory Visit: Payer: Self-pay | Admitting: *Deleted

## 2015-08-06 MED ORDER — GABAPENTIN 100 MG PO CAPS: 100.0000 mg | ORAL_CAPSULE | Freq: Every day | ORAL | Status: DC

## 2015-08-06 MED ORDER — GABAPENTIN 300 MG PO CAPS: 300.0000 mg | ORAL_CAPSULE | Freq: Every day | ORAL | Status: DC

## 2015-08-07 ENCOUNTER — Encounter: Payer: Self-pay | Admitting: Oncology

## 2015-08-07 NOTE — Progress Notes (Signed)
sent prior auth for ambien-zolpidem

## 2015-08-15 ENCOUNTER — Encounter: Payer: Self-pay | Admitting: Oncology

## 2015-08-15 NOTE — Progress Notes (Signed)
sent prior auth req for ambien  zolpidem

## 2015-08-21 ENCOUNTER — Encounter: Payer: Self-pay | Admitting: Oncology

## 2015-08-21 NOTE — Progress Notes (Signed)
Per covermymeds-approved-- [facility]. I let Darlena know and pharmacy

## 2015-08-24 ENCOUNTER — Other Ambulatory Visit: Payer: Self-pay | Admitting: Oncology

## 2015-08-25 NOTE — Telephone Encounter (Signed)
Chart reviewed.

## 2015-09-09 ENCOUNTER — Ambulatory Visit (INDEPENDENT_AMBULATORY_CARE_PROVIDER_SITE_OTHER): Payer: Medicare Other | Admitting: Podiatry

## 2015-09-09 ENCOUNTER — Encounter: Payer: Self-pay | Admitting: Podiatry

## 2015-09-09 VITALS — BP 143/72 | HR 68 | Resp 16

## 2015-09-09 DIAGNOSIS — L600 Ingrowing nail: Secondary | ICD-10-CM | POA: Diagnosis not present

## 2015-09-09 DIAGNOSIS — L608 Other nail disorders: Secondary | ICD-10-CM | POA: Diagnosis not present

## 2015-09-09 DIAGNOSIS — Q828 Other specified congenital malformations of skin: Secondary | ICD-10-CM

## 2015-09-09 DIAGNOSIS — L603 Nail dystrophy: Secondary | ICD-10-CM | POA: Diagnosis not present

## 2015-09-09 DIAGNOSIS — B351 Tinea unguium: Secondary | ICD-10-CM | POA: Diagnosis not present

## 2015-09-09 NOTE — Progress Notes (Signed)
   Subjective:    Patient ID: Kirsten Gibson, female    DOB: October 10, 1945, 70 y.o.   MRN: [id number]  HPI: She presents today with chief complaint of the occasional pain to the hallux fibular border left. She states that bothers her with burning and tingling and she has these flareups on occasions that are really debilitating. She thinks this just an ingrown toenail. She is also concerned about the areas of thickness on the plantar aspect of the bilateral foot as well as the thickening of her hallux nails bilaterally.    Review of Systems  Constitutional: Positive for diaphoresis.  HENT: Positive for hearing loss.   Neurological: Positive for numbness.  All other systems reviewed and are negative.      Objective:   Physical Exam: Vital signs are stable she is alert and oriented 3 she has a history of breast cancer and chemotherapy. Pulses are strongly palpable neurologic sensorium is intact deep tendon reflexes are intact muscle strength is normal and symmetrical bilateral. Orthopedic evaluation shows all joints is local full range of motion without crepitus she does have mild overlapping digits. Her second toe does sit on the fibular border of the hallux left is causing some erythema and some swelling. This is mildly tender on palpation. Multiple porokeratotic lesions with no open wounds are noted to the plantar aspect of the bilateral foot degree forefoot and sub-fifth metatarsals bilaterally. She also has thickened slightly discolored toenails particularly hallux bilaterally.        Assessment & Plan:  Mild ingrown nail fibular border of the hallux left. She does not want to have the procedure performed today. Porokeratosis bilateral. Nail dystrophy hallux bilateral.  Plan: Discussed etiology pathology conservative or surgical therapies at this point we discussed in great detail fibular border excision of the hallux left. We also discussed debridement of all reactive hyperkeratosis as well  as taking a sample of the nail plate to be sent for pathologic evaluation to evaluate for fungus. I expressed to her that this very well may be a nail dystrophy she understands that and is amenable to it will follow up with her as the results come in. She may like to have the ingrown nail fixed at her next visit.

## 2015-09-15 DIAGNOSIS — M5136 Other intervertebral disc degeneration, lumbar region: Secondary | ICD-10-CM | POA: Diagnosis not present

## 2015-09-15 DIAGNOSIS — M545 Low back pain: Secondary | ICD-10-CM | POA: Diagnosis not present

## 2015-10-03 ENCOUNTER — Telehealth: Payer: Self-pay | Admitting: *Deleted

## 2015-10-03 NOTE — Telephone Encounter (Signed)
Dr. Milinda Pointer reviewed fungal culture 09/09/2015 as negative. Unable to leave a message phone rang sounded like it was answered but no message, so I left a message and was cut off.

## 2015-10-07 ENCOUNTER — Ambulatory Visit (INDEPENDENT_AMBULATORY_CARE_PROVIDER_SITE_OTHER): Payer: Medicare Other | Admitting: Podiatry

## 2015-10-07 ENCOUNTER — Encounter: Payer: Self-pay | Admitting: Podiatry

## 2015-10-07 DIAGNOSIS — L603 Nail dystrophy: Secondary | ICD-10-CM | POA: Diagnosis not present

## 2015-10-08 NOTE — Progress Notes (Signed)
She presents today for follow-up of her lab results.  Objective: Vital signs are stable she is alert and oriented 3. Pulses are palpable. Lab results are negative for fungus. But positive for microtrauma.  Assessment: Micro-trauma resulting in nail dystrophy hallux bilateral.  Plan: Discussed this with her in great detail today as well as her with her husband both understand and are amenable to the decision.

## 2015-11-03 ENCOUNTER — Telehealth: Payer: Self-pay | Admitting: Adult Health

## 2015-11-03 NOTE — Telephone Encounter (Signed)
I received in-basket message from [facility], NT regarding phone call from Ms. Jewkes about her upcoming appt; patient with questions regarding the reasoning for the appt.  I let Ellison Hughs know that I would try to follow-up with the patient to answer her questions.   Attempted to reach Ms. Rotondo; there was no answer and no voicemail for me to be able to leave a message.   Returned [employer] to [place] and encouraged her to forward the patient's call to my direct extension [phone] if the patient calls back and I will be happy to talk with her.    Mike Craze, NP [place] [phone]

## 2015-11-06 ENCOUNTER — Other Ambulatory Visit: Payer: Self-pay | Admitting: Oncology

## 2015-11-06 DIAGNOSIS — M546 Pain in thoracic spine: Secondary | ICD-10-CM | POA: Diagnosis not present

## 2015-11-06 DIAGNOSIS — M5136 Other intervertebral disc degeneration, lumbar region: Secondary | ICD-10-CM | POA: Diagnosis not present

## 2015-11-06 DIAGNOSIS — M545 Low back pain: Secondary | ICD-10-CM | POA: Diagnosis not present

## 2015-11-07 ENCOUNTER — Other Ambulatory Visit: Payer: Self-pay | Admitting: Sports Medicine

## 2015-11-07 DIAGNOSIS — M5136 Other intervertebral disc degeneration, lumbar region: Secondary | ICD-10-CM

## 2015-11-08 DIAGNOSIS — M5136 Other intervertebral disc degeneration, lumbar region: Secondary | ICD-10-CM | POA: Diagnosis not present

## 2015-11-08 DIAGNOSIS — M546 Pain in thoracic spine: Secondary | ICD-10-CM | POA: Diagnosis not present

## 2015-11-11 ENCOUNTER — Telehealth: Payer: Self-pay | Admitting: *Deleted

## 2015-11-11 NOTE — Telephone Encounter (Signed)
Received call from pt stating that she wanted to leave a message for Val regarding prescriptions. She has [facility]ies from [facility] to [facility]. Ph [phone].  She states that Val had suggested she take (2) 300 mg neurontin at hs but she was afraid to do this.  She has been taking a 300 mg cap at hs & when she wakes up @ 2:30 am taking (2) 100 mg caps.  She also splits up her ambien & takes 1/2 at hs @ 9:30 pm & the other half @ 2:30 am & also does the same with melatonin 1mg -splitting in 1/2.  She states she is sleeping better than she has in a long time.  She thinks the neurontin is helping & wants a 90 day supply of 300 mg to take 2 at hs but will cont to split up dose unless Dr Jana Hakim thinks she should do something different.  She is afraid to have 2 different doses b/c she thinks she may get them mixed up.  She also wants refill on her Lorrin Mais & wants 90 day supply also.  Message routed to Dr. Dimas Millin RN

## 2015-11-12 ENCOUNTER — Other Ambulatory Visit: Payer: Self-pay | Admitting: *Deleted

## 2015-11-12 DIAGNOSIS — C50411 Malignant neoplasm of upper-outer quadrant of right female breast: Secondary | ICD-10-CM

## 2015-11-12 DIAGNOSIS — F419 Anxiety disorder, unspecified: Secondary | ICD-10-CM

## 2015-11-12 MED ORDER — ZOLPIDEM TARTRATE 5 MG PO TABS: ORAL_TABLET | ORAL | 3 refills | Status: DC

## 2015-11-12 MED ORDER — GABAPENTIN 300 MG PO CAPS: 600.0000 mg | ORAL_CAPSULE | Freq: Every day | ORAL | 3 refills | Status: DC

## 2015-11-14 ENCOUNTER — Other Ambulatory Visit: Payer: Self-pay | Admitting: *Deleted

## 2015-11-14 DIAGNOSIS — C50411 Malignant neoplasm of upper-outer quadrant of right female breast: Secondary | ICD-10-CM

## 2015-11-17 ENCOUNTER — Other Ambulatory Visit (HOSPITAL_BASED_OUTPATIENT_CLINIC_OR_DEPARTMENT_OTHER): Payer: Medicare Other

## 2015-11-17 DIAGNOSIS — C50411 Malignant neoplasm of upper-outer quadrant of right female breast: Secondary | ICD-10-CM | POA: Diagnosis present

## 2015-11-17 LAB — COMPREHENSIVE METABOLIC PANEL
ALT: 20 U/L (ref 0–55)
ANION GAP: 11 meq/L (ref 3–11)
AST: 27 U/L (ref 5–34)
Albumin: 3.8 g/dL (ref 3.5–5.0)
Alkaline Phosphatase: 63 U/L (ref 40–150)
BUN: 14.9 mg/dL (ref 7.0–26.0)
CALCIUM: 9.6 mg/dL (ref 8.4–10.4)
CO2: 25 meq/L (ref 22–29)
Chloride: 105 mEq/L (ref 98–109)
Creatinine: 0.8 mg/dL (ref 0.6–1.1)
EGFR: 75 mL/min/{1.73_m2} — AB (ref >90)
Glucose: 88 mg/dl (ref 70–140)
Potassium: 3.8 mEq/L (ref 3.5–5.1)
Sodium: 141 mEq/L (ref 136–145)
TOTAL PROTEIN: 6.9 g/dL (ref 6.4–8.3)

## 2015-11-17 LAB — CBC WITH DIFFERENTIAL/PLATELET
BASO%: 0.5 % (ref 0.0–2.0)
Basophils Absolute: 0 10*3/uL (ref 0.0–0.1)
EOS ABS: 0.1 10*3/uL (ref 0.0–0.5)
EOS%: 1.1 % (ref 0.0–7.0)
HEMATOCRIT: 36.6 % (ref 34.8–46.6)
HGB: 12.5 g/dL (ref 11.6–15.9)
LYMPH%: 36.4 % (ref 14.0–49.7)
MCH: 31.5 pg (ref 25.1–34.0)
MCHC: 34.2 g/dL (ref 31.5–36.0)
MCV: 92.2 fL (ref 79.5–101.0)
MONO#: 0.4 10*3/uL (ref 0.1–0.9)
MONO%: 7.7 % (ref 0.0–14.0)
NEUT#: 3.1 10*3/uL (ref 1.5–6.5)
NEUT%: 54.3 % (ref 38.4–76.8)
PLATELETS: 176 10*3/uL (ref 145–400)
RBC: 3.97 10*6/uL (ref 3.70–5.45)
RDW: 12.4 % (ref 11.2–14.5)
WBC: 5.7 10*3/uL (ref 3.9–10.3)
lymph#: 2.1 10*3/uL (ref 0.9–3.3)

## 2015-11-17 LAB — DRAW EXTRA CLOT TUBE

## 2015-11-19 DIAGNOSIS — M5124 Other intervertebral disc displacement, thoracic region: Secondary | ICD-10-CM | POA: Diagnosis not present

## 2015-11-19 DIAGNOSIS — M546 Pain in thoracic spine: Secondary | ICD-10-CM | POA: Diagnosis not present

## 2015-11-19 DIAGNOSIS — M5136 Other intervertebral disc degeneration, lumbar region: Secondary | ICD-10-CM | POA: Diagnosis not present

## 2015-11-21 ENCOUNTER — Telehealth: Payer: Self-pay | Admitting: *Deleted

## 2015-11-21 NOTE — Telephone Encounter (Signed)
"  What are the lab hours.  I need my lab results from November 17, 2015 before [facility] appointment with Elzie Rings.  How do I get them.  Advised she go to [facility] If labs not provided while here.  Does not wish to use them stating someone in the lab usually handles this.  Transferred to lab who confirm and will provide results for her.

## 2015-11-24 ENCOUNTER — Encounter: Payer: Medicare Other | Admitting: Adult Health

## 2015-12-11 ENCOUNTER — Telehealth: Payer: Self-pay | Admitting: *Deleted

## 2015-12-11 NOTE — Telephone Encounter (Signed)
FYI "I had an appointment November 14, 2015 with [facility] that was cancelled due to provider illness.  Is she okay and has this appointment been rescheduled yet.   Will I see a new provider?"   Call lost.  Will notify staff and scheduling message sent.

## 2015-12-18 DIAGNOSIS — E7800 Pure hypercholesterolemia, unspecified: Secondary | ICD-10-CM | POA: Diagnosis not present

## 2015-12-18 DIAGNOSIS — C50912 Malignant neoplasm of unspecified site of left female breast: Secondary | ICD-10-CM | POA: Diagnosis not present

## 2015-12-18 DIAGNOSIS — C50911 Malignant neoplasm of unspecified site of right female breast: Secondary | ICD-10-CM | POA: Diagnosis not present

## 2015-12-22 ENCOUNTER — Ambulatory Visit (HOSPITAL_BASED_OUTPATIENT_CLINIC_OR_DEPARTMENT_OTHER): Payer: Medicare Other | Admitting: Adult Health

## 2015-12-22 VITALS — BP 138/72 | HR 73 | Temp 98.0°F | Resp 18 | Ht 60.0 in | Wt 115.0 lb

## 2015-12-22 DIAGNOSIS — Z79810 Long term (current) use of selective estrogen receptor modulators (SERMs): Secondary | ICD-10-CM

## 2015-12-22 DIAGNOSIS — C50411 Malignant neoplasm of upper-outer quadrant of right female breast: Secondary | ICD-10-CM

## 2015-12-22 DIAGNOSIS — C50912 Malignant neoplasm of unspecified site of left female breast: Secondary | ICD-10-CM

## 2015-12-22 DIAGNOSIS — Z87891 Personal history of nicotine dependence: Secondary | ICD-10-CM

## 2015-12-22 DIAGNOSIS — Z170 Estrogen receptor positive status [ER+]: Secondary | ICD-10-CM | POA: Diagnosis not present

## 2015-12-22 DIAGNOSIS — C50911 Malignant neoplasm of unspecified site of right female breast: Secondary | ICD-10-CM

## 2015-12-23 ENCOUNTER — Encounter: Payer: Self-pay | Admitting: Adult Health

## 2015-12-23 NOTE — Progress Notes (Signed)
CLINIC:  Survivorship   REASON FOR VISIT:  Routine follow-up for history of breast cancer.   BRIEF ONCOLOGIC HISTORY:    Primary cancer of upper outer quadrant of right female breast ([place])   05/03/2011 Mammogram    Right breast: 1.8 cm irregular mass with architectural distortion at 10:00, middle depth. 2-3 indeterminate associated microcalcifications      05/04/2011 Initial Biopsy    Right breast core needle bx: invasive ductal carcinoma, grade 2, ER+ (82%), PR+ (92%), HER2/neu positive (ratio 2.86),  Ki67 12%. 1 LN postiive for metastasis      05/10/2011 Breast MRI    Right: 1.7 cm discrete mass within the UOQ, consistent with known malignancy. Enhancement extends anterior to this mass, suspicious for DCIS.Left: upper central aspect clumped nodular enhancement is suspicious for DCIS      05/19/2011 Procedure    RIGHT breast needle core bx: IDC background DCIS. HER2/neu negative (ratio 1.32).  LEFT breast needle core bx: Invasive ductal carcinoma with background DCIS, grade 1., ER+ (100%), PR+ (100%), Ki67 6%, HER2/neu negative (ratio 1.31).      05/23/2011 - 09/29/2011 Neo-Adjuvant Chemotherapy    carboplatin/ docetaxel/ trastuzumab x 6 cycles      07/26/2011 Breast MRI    Interval response to neoadjuvant chemotherapy in both breasts. There is been interval improvement in the clumped linear enhancement the UOQ the right breast as well as the nodular enhancement in the left breast.      10/05/2011 - 12/16/2011 Chemotherapy    Trastuzumab q 21 days      11/2011 Definitive Surgery    Bilateral mastectomies [facility]): RIGHT -  IDC, 1.2 cm, 3/19 LN positive for metastasis (Stage IIA ypT1c ypN1).  LEFT - IDC, 1 mm, 0/5 LN positive for metastasis (Stage IA ypT17m ypN0)       12/17/2011 - 05/2012 Chemotherapy    Adjuvant trastuzumab/ pertuzumab x3, discontinued 01/28/2012. Trastuzumab continued 02/18/2012, completed 05/2012. Final echocardiogram 06/26/2012 showed a well preserved ejection  fraction.       01/11/2012 - 02/28/2012 Radiation Therapy    Adjuvant RT completed (Kirsten Gibson: Right chestwall: 50.4 Gray over 28 fractions.  Right supraclavicular fossa: 45 Gy over 25 fractions.  Right drain site: 46 Gy over 23 fractions. Right scar boost: 10 Gy over 8 fractions.       03/25/2012 -  Anti-estrogen oral therapy    Tamoxifen 20 mg daily.        INTERVAL HISTORY:  Ms. BHaganpresents to the Survivorship Clinic today for routine follow-up for her history of breast cancer.    She is very anxious and has several questions she would like addressed today.  She is very concerned about the prospect of bilateral arm lymphedema. She wants needed Chek comes today to see if I think she has lymphedema. She is also concerned about lab draws and blood pressures in her arms, given her history of nodes removed from bilateral axillae.  She like to know if she needs a low-dose CT scan, for lung cancer screening.   She also is concerned that she was recently diagnosed with some spine/disc issues, and now has pain across her chest where her mastectomies were done.  [facility] she brought copies of the report for u[place] review together today.   She has noticed a rash to her left lower leg and would like me to assess this today as well.    REVIEW OF SYSTEMS:  Review of Systems  Constitutional: Negative.   HENT:  Negative.   Gastrointestinal: Negative.   Endocrine: Positive for hot flashes.  Genitourinary: Negative.    Musculoskeletal: Positive for back pain.  Skin: Positive for rash.  Neurological: Negative.   Hematological: Negative.   Psychiatric/Behavioral: Negative.   Breast: Denies any new nodularity, masses.  A 14-point review of systems was completed and was negative, except as noted above.    PAST MEDICAL/SURGICAL HISTORY:  Past Medical History:  Diagnosis Date  . Anemia due to chemotherapy 08/23/2011  . Anxiety   . Breast  cancer ([place])    bilateral infiltrating ductal carcinoma  . Frequency   . Hx of radiation therapy 01/11/12- 02/28/12   right chest wall/supraclav fossa/drain site/scar boost  . Loose stools   . Mycosis fungoides    follows up with [facility] once to twice per year   . Rosacea   . S/P chemotherapy, time since 4-12 weeks   . TMJ click    Past Surgical History:  Procedure Laterality Date  . ABDOMINAL HYSTERECTOMY  1988   partial with on ovary remaining.  . benign tumor removed from neck  1974  . BIOPSY BREAST     right breast biopsy   . BREAST BIOPSY  05/19/11   left  and right breast  . CHOLECYSTECTOMY    . MASTECTOMY  11/2011   bilat, [place] Left 04/06/2012   Procedure: MINOR REMOVAL PORT-A-CATH;  Surgeon: Kirsten Lasso, MD;  Location: [place];  Service: General;  Laterality: Left;  . PORTACATH PLACEMENT  05/21/2011   Procedure: INSERTION PORT-A-CATH;  Surgeon: Kirsten Lasso, MD;  Location: [facility];  Service: General;  Laterality: Left;  . rt breast biobsy       ALLERGIES:  Allergies  Allergen Reactions  . Latex Dermatitis and Rash  . Adhesive [Tape]     ALLERGIC TO OP-SITE......USE OP-SITE FLEXIGRID INSTEAD !!!  . Trazodone And Nefazodone Nausea Only  . Zofran [Ondansetron Hcl] Swelling    Swelling&redness to face!!  . Diclofenac Other (See Comments)    Fever, chills  . Naproxen Other (See Comments)    Fever, chills     CURRENT MEDICATIONS:  Outpatient Encounter Prescriptions as of 12/22/2015  Medication Sig Note  . aspirin EC 81 MG tablet Take 1 tablet (81 mg total) by mouth daily.   . calcium carbonate (OS-CAL) 600 MG TABS Take 750 mg by mouth daily.    [name] gabapentin (NEURONTIN) 300 MG capsule Take 2 capsules (600 mg total) by mouth at bedtime.   . Multiple Vitamins-Minerals (MULTIVITAMIN PO) Take 1 capsule by mouth daily.   . NON FORMULARY Take by mouth. Flax seed 12/22/2015: Received from: [place]: Take by mouth.  . tamoxifen (NOLVADEX) 20 MG tablet Take one tablet by mouth one time daily   . zolpidem (AMBIEN) 5 MG tablet TAKE 1 TAB BY MOUTH NIGHTLY AT BEDTIME AS NEEDED FOR SLEEP    No facility-administered encounter medications on file as of 12/22/2015.      ONCOLOGIC FAMILY HISTORY:  Family History  Problem Relation Age of Onset  . Cancer Maternal Aunt     breast to brain  . Cancer Paternal Grandmother     breast and/or ovarian///unknown  . Cancer Paternal Grandfather     stomach  . Colon cancer Neg Hx     GENETIC COUNSELING/TESTING: No records available for review.  SOCIAL HISTORY:  RADIE BERGES is married  and lives with her husband in [place], [place]. She is retired; previously worked at [employer].   She formally smoked cigarettes; quit 1998; tobacco use verified today-smoked 1.5 ppd x 23 years (34.5 pack years). Denies any current tobacco use.  Denies alcohol or illicit drug abuse.    PHYSICAL EXAMINATION:  Vital Signs: Vitals:   12/22/15 1401  BP: 138/72  Pulse: 73  Resp: 18  Temp: 98 F (36.7 C)   Filed Weights   12/22/15 1401  Weight: 115 lb (52.2 kg)   General: Well-nourished, well-appearing female in no acute distress.  She is unaccompanied today.   HEENT: Head is normocephalic.  Pupils equal and reactive to light. Conjunctivae clear without exudate.  Sclerae anicteric. Oral mucosa is pink, moist.  Oropharynx is pink without lesions or erythema.  Lymph: No cervical, supraclavicular, or infraclavicular lymphadenopathy noted on palpation.  Cardiovascular: Regular rate and rhythm.[name] Respiratory: Clear to auscultation bilaterally. Chest expansion symmetric; breathing non-labored.  Breast Exam:  -Left breast: s/p mastectomy; no skin redness or nodularity; healed scar without erythema or nodularity.  -Right breast: s/p mastectomy; no skin redness or nodularity; healed scar without erythema or nodularity.  -No evidence of  appreciable arm lymphedema bilaterally; arms appear symmetric in diameter without significant or appreciable differences.  GI: Abdomen soft and round; non-tender, non-distended. Bowel sounds normoactive. No hepatosplenomegaly.   GU: Deferred.  Neuro: No focal deficits. Steady gait.  Psych: Anxious mood/affect.  Extremities: No edema. Skin: Warm and dry. No rash noted to left lower leg in area of patient concern.    LABORATORY DATA:  None for this visit.   DIAGNOSTIC IMAGING:  Most recent mammogram: None (bilateral mastectomies)    ASSESSMENT AND PLAN:  Kirsten Gibson is a pleasant 70 y.o. female with history of Stage IIB right breast invasive ductal carcinoma, ER+/PR+/HER2+,  diagnosed in 04/2011; & left breast invasive ductal carcinoma (HER2 negative) diagnosed in 05/2011.  Bilateral breast cancers treated with neoadjuvant chemotherapy with Taxotere/Cytoxan/Herceptin x 6 cycles, completed chemo on 09/29/11.  She went on to have bilateral mastectomies at [facility] with left axillary LND and right sentinel LN biopsy.  She went on to have adjuvant chemotherapy with Herceptin/Perjeta x 3 cycles, and chemo was discontinued in 01/2012.  She completed maintenance Herceptin through 05/2012.  She started anti-estrogen therapy with Tamoxifen in 03/2012.  She presents to the Survivorship Clinic for surveillance and routine follow-up.   1. History of bilateral breast cancers:  Kirsten Gibson is currently clinically without evidence of disease or recurrence of breast cancer. She will follow-up with her medical oncologist, Dr. Jana Hakim, in 05/2016 with physical exam per surveillance protocol.  She will continue her endocrine therapy with Tamoxifen as prescribed by Dr. Jana Hakim. I encouraged her to call me with any additional questions or concerns before her next visit to the cancer center, and I'll be happy to see her sooner if needed.  2. Thoracic spine radiculopathy: MRI thoracic & lumbar spine  results reviewed from [facility]. There is no evidence of metastases in the thoracic or lumbar spine. There is mention of T4-T5 3 mm central posterior disc protrusion producing mild mass effect on the cord and mild to moderate central canal stenosis. There is also mention of T9-T10 3 mm left paracentral disc extrusion producing mild mass effect on the cord and mild to moderate central canal stenosis. Thickened ligamentum flavum. Lastly, there is mention of a T10-T11 thickened ligamentum flavum.  In the lumbar spine, there is mention  of L4-L5 and L5-S1 mild disc bulges and mild to moderate facet arthrosis without affecting the nerve roots. Lastly, there is mention of L3-L4 mild disc bulge with right lateral annular fissure. No mass effect on the nerve roots. [copies of these reports sent to HIM to be scanned into patient record].  -I discussed with Kirsten Gibson that these findings are reassuring for cancer being the etiology of her back pain.  Regarding the referred pain she is experiencing around to her chest, I reviewed the below image and discussed radicular pain with her.  Given that she has disc protrusion and extrusion in several areas of the thoracic spine, this is likely the etiology of her referred pain to the front of her chest.       3. Concerns regarding lymphedema:  Kirsten Gibson has a lot of anxiety about the potential of lymphedema given her left axillary lymph node dissection (total of 19 LNs removed) and right axillary sentinel lymph node biopsy (total of 5 LNs removed) in treatment for bilateral breast cancers.  There is no evidence of lymphedema on physical exam today.  She understands that lymphedema is generally gradual and prevention is best in terms of managing lymphedema.  She tells me that she does not have a lymphedema sleeve, particularly for the right arm, which has higher risk for lymphedema.  I provided her a paper prescription for right arm compression sleeve today.   -She  is also concerned about having blood pressures & lab draws and wants our recommendations on what she should do since she had lymph nodes removed from both axillae.  We discussed that we do recommend avoiding blood draws and blood pressures to the arms where lymph nodes have been removed.  We could certainly perform blood pressures on her leg going forward.  She asked if she could get a port-a-cath placed for her blood draws.  I let her know that having a port placed is not a benign procedure and the risks/maintenance of the port-a-cath is not necessary for her once or twice yearly blood draws.  I think having annual labs at the cancer center and using her left hand would be reasonable. Since this will likely be only annual labs, there will be lower risk of long-term side effects with one blood draw per year.  She would like [place] to coordinate this with her PCP, as she would rather have all of her labs collected here at the cancer center.  I let Kirsten Gibson know that I would talk with Val, Dr. Virgie Dad RN, to see if coordinating whatever labs her PCP (Dr. [name]) would like collected, in conjunction with the basic labs we draw per year would be a reasonable solution to save her an additional stick to her arms.  She would like to know if she should use her feet instead for lab draws.  I shared with her that I generally do not recommend lab draws in feet, unless it is absolutely necessary; I will defer to Val and Dr. Virgie Dad judgment moving forward on how they would like her lab draws collected/maintained.   4. Lung cancer screening: [place] now offers eligible patients lung cancer screening with a low-dose chest CT to aid in early detection, provide more effective treatment options, and ultimately improve survival benefits for patients diagnosed early.  Below is the selection criteria for screening:  . Medicare patients: 55-77 years; privately insured patients 55-80 years. . Active or former smokers  who have quit within the  last 15 years. . 30+ pack-year history of smoking  . Exclusion criteria - No signs/symptoms of lung cancer (i.e., no recent history of hemoptysis and no unexplained weight loss >15 pounds in the last 6 months). . Willing and healthy enough to undergo biopsies/surgery if needed.  Ms. Searight currently does meet criteria for lung cancer screening.  Therefore, I have placed a referral for screening today.     5. Bone health:  Given Ms. Stacy's age & history of breast cancer, she is at risk for bone demineralization. Her last DEXA scan was on 05/23/14 & showed osteopenia. She understands that tamoxifen offers the positive side effect of increased bone density, when compared to aromatase inhibitors. She will be due for repeat DEXA scan sometime in 2018, as clinically indicated by her medical oncologist or PCP.  In the meantime, she was encouraged to increase her consumption of foods rich in calcium, as well as increase her weight-bearing activities.  She was given education on specific food and activities to promote bone health.      Dispo:  -Return to cancer center to see Dr. Jana Hakim in 05/2016. -Low-dose CT chest for lung cancer screening ordered today.    A total of 35 minutes of face-to-face time was spent with this patient with greater than 50% of that time in counseling and care-coordination.   Mike Craze, NP Survivorship Program [place] [phone]   Note: PRIMARY CARE PROVIDER [name], [place] [phone]

## 2015-12-31 DIAGNOSIS — D1801 Hemangioma of skin and subcutaneous tissue: Secondary | ICD-10-CM | POA: Diagnosis not present

## 2015-12-31 DIAGNOSIS — Z23 Encounter for immunization: Secondary | ICD-10-CM | POA: Diagnosis not present

## 2015-12-31 DIAGNOSIS — L719 Rosacea, unspecified: Secondary | ICD-10-CM | POA: Diagnosis not present

## 2015-12-31 DIAGNOSIS — R202 Paresthesia of skin: Secondary | ICD-10-CM | POA: Diagnosis not present

## 2015-12-31 DIAGNOSIS — I781 Nevus, non-neoplastic: Secondary | ICD-10-CM | POA: Diagnosis not present

## 2015-12-31 DIAGNOSIS — D692 Other nonthrombocytopenic purpura: Secondary | ICD-10-CM | POA: Diagnosis not present

## 2015-12-31 DIAGNOSIS — D225 Melanocytic nevi of trunk: Secondary | ICD-10-CM | POA: Diagnosis not present

## 2015-12-31 DIAGNOSIS — L821 Other seborrheic keratosis: Secondary | ICD-10-CM | POA: Diagnosis not present

## 2015-12-31 DIAGNOSIS — L814 Other melanin hyperpigmentation: Secondary | ICD-10-CM | POA: Diagnosis not present

## 2015-12-31 DIAGNOSIS — L570 Actinic keratosis: Secondary | ICD-10-CM | POA: Diagnosis not present

## 2015-12-31 DIAGNOSIS — Z808 Family history of malignant neoplasm of other organs or systems: Secondary | ICD-10-CM | POA: Diagnosis not present

## 2016-01-09 ENCOUNTER — Other Ambulatory Visit: Payer: Self-pay | Admitting: *Deleted

## 2016-01-09 DIAGNOSIS — C50411 Malignant neoplasm of upper-outer quadrant of right female breast: Secondary | ICD-10-CM

## 2016-01-09 DIAGNOSIS — Z0000 Encounter for general adult medical examination without abnormal findings: Secondary | ICD-10-CM

## 2016-01-09 DIAGNOSIS — Z79899 Other long term (current) drug therapy: Secondary | ICD-10-CM

## 2016-01-09 DIAGNOSIS — C50819 Malignant neoplasm of overlapping sites of unspecified female breast: Secondary | ICD-10-CM

## 2016-01-14 DIAGNOSIS — N950 Postmenopausal bleeding: Secondary | ICD-10-CM | POA: Diagnosis not present

## 2016-01-14 DIAGNOSIS — N898 Other specified noninflammatory disorders of vagina: Secondary | ICD-10-CM | POA: Diagnosis not present

## 2016-01-16 ENCOUNTER — Ambulatory Visit: Payer: Medicare Other | Admitting: Obstetrics & Gynecology

## 2016-01-16 ENCOUNTER — Telehealth: Payer: Self-pay | Admitting: Physical Therapy

## 2016-01-16 ENCOUNTER — Ambulatory Visit: Payer: Medicare Other | Attending: Adult Health | Admitting: Physical Therapy

## 2016-01-16 NOTE — Telephone Encounter (Signed)
Patient arrived to PT appointment because she thought she had an order for PT. She had an order to obtain a compression sleeve but no order for PT. Pt had a previous visit to this clinic in May and was measured at that time. She did not show any signs of lymphedema at that time. Pt was re measured today and measurements were compared to measurements in May and pt still does not show any signs of lymphedema. Pt was educated in lymphedema risk reduction practices and given information to obtain a compression sleeve.  Allyson Sabal, PT 01/16/2016 8:35 AM

## 2016-01-20 ENCOUNTER — Ambulatory Visit (INDEPENDENT_AMBULATORY_CARE_PROVIDER_SITE_OTHER): Payer: Medicare Other | Admitting: Obstetrics & Gynecology

## 2016-01-20 ENCOUNTER — Encounter: Payer: Self-pay | Admitting: Obstetrics & Gynecology

## 2016-01-20 VITALS — BP 132/88 | HR 80 | Resp 16 | Ht 60.0 in | Wt 112.0 lb

## 2016-01-20 DIAGNOSIS — R350 Frequency of micturition: Secondary | ICD-10-CM

## 2016-01-20 DIAGNOSIS — C50819 Malignant neoplasm of overlapping sites of unspecified female breast: Secondary | ICD-10-CM

## 2016-01-20 DIAGNOSIS — N3281 Overactive bladder: Secondary | ICD-10-CM | POA: Diagnosis not present

## 2016-01-20 DIAGNOSIS — N939 Abnormal uterine and vaginal bleeding, unspecified: Secondary | ICD-10-CM

## 2016-01-20 DIAGNOSIS — N952 Postmenopausal atrophic vaginitis: Secondary | ICD-10-CM | POA: Diagnosis not present

## 2016-01-20 NOTE — Progress Notes (Signed)
70 y.o. G0P0000 MarriedCaucasianF here for complaint of PMP bleeding.  Pt reports this started on 12/5 and was light and pinky in appearance that was associated with a little "gush" of fluid.  Pt had never had anything like this.  She reports this happened a second time about 30 minutes after the first episode.  She is wearing panty liners now.  She is noting daily discharge but it seems episodic.  She is referred from Dr. Justin Mend but typically sees Dr. Stephanie Acre.  Does not feel this "gush" was urinary because there wasn't an odor of urine.  She has developed some urinary frequency that has occurred over the last 2-3 years.  She is now getting up 3-4 times a night.    Was diagnosed with breast cancer in 2013.  Was treated with neoadjuvant therapy before her right modified radical mastectomy and left mastectomy with SNL biopsy.  Then was treated with radiation and is now on Tamoxifen.    Has seen Dr. Matilde Sprang at [facility] urology.  Has used oxytrol patches.  Really helped symptoms but stopped it due to concerns about side effects.    Pt is very nervous today and has lots of questions about essentially everything we discussed today.  Patient's last menstrual period was 02/09/1996.          Sexually active: Yes.    The current method of family planning is status post hysterectomy.    Exercising: No.  The patient does not participate in regular exercise at present. Smoker:  no  Health Maintenance: Pap:  07/31/13 Neg  History of abnormal Pap:  Yes, years ago. Repeat was negative MMG:  07/26/11 BIRADS6: Known biopsy proven malignancy. Double Mastectomy  Colonoscopy:  05/24/12 Normal - f/u 10 years  BMD:   02/23/12 Osteopenia  TDaP:  07/31/13 Pneumonia vaccine(s):  2017  Zostavax:   Not indicated by Dr. At [facility]  Hep C testing: done with PCP Screening Labs: PCP   reports that she quit smoking about 29 years ago. Her smoking use included Cigarettes. She has a 20.00 pack-year smoking history. She has never  used smokeless tobacco. She reports that she does not drink alcohol or use drugs.  Past Medical History:  Diagnosis Date  . Anemia due to chemotherapy 08/23/2011  . Anxiety   . Breast cancer ([place])    bilateral infiltrating ductal carcinoma  . Frequency   . Hx of radiation therapy 01/11/12- 02/28/12   right chest wall/supraclav fossa/drain site/scar boost  . Loose stools   . Mycosis fungoides    follows up with [facility] once to twice per year   . Rosacea   . S/P chemotherapy, time since 4-12 weeks   . TMJ click     Past Surgical History:  Procedure Laterality Date  . ABDOMINAL HYSTERECTOMY  1988   partial with on ovary remaining.  . benign tumor removed from neck  1974  . BIOPSY BREAST     right breast biopsy   . BREAST BIOPSY  05/19/11   left  and right breast  . CHOLECYSTECTOMY    . MASTECTOMY  11/2011   bilat, [place] Left 04/06/2012   Procedure: MINOR REMOVAL PORT-A-CATH;  Surgeon: [name], MD;  Location: [place];  Service: General;  Laterality: Left;  . PORTACATH PLACEMENT  05/21/2011   Procedure: INSERTION PORT-A-CATH;  Surgeon: [name], MD;  [facility];  Service: General;  Laterality: Left;  . rt breast biobsy  Current Outpatient Prescriptions  Medication Sig Dispense Refill  . aspirin EC 81 MG tablet Take 1 tablet (81 mg total) by mouth daily.    . calcium carbonate (OS-CAL) 600 MG TABS Take 750 mg by mouth daily.     [name] gabapentin (NEURONTIN) 300 MG capsule Take 2 capsules (600 mg total) by mouth at bedtime. 180 capsule 3  . Melatonin 1 MG CAPS Take 0.5 capsules by mouth at bedtime.    . Multiple Vitamins-Minerals (MULTIVITAMIN PO) Take 1 capsule by mouth daily.    . NON FORMULARY Take by mouth. Flax seed    . tamoxifen (NOLVADEX) 20 MG tablet Take one tablet by mouth one time daily 90 tablet 4  . zolpidem (AMBIEN) 5 MG tablet TAKE 1 TAB BY MOUTH NIGHTLY AT BEDTIME AS NEEDED FOR SLEEP 90  tablet 3   No current facility-administered medications for this visit.     Family History  Problem Relation Age of Onset  . Cancer Maternal Aunt     breast to brain  . Cancer Paternal Grandmother     breast and/or ovarian///unknown  . Cancer Paternal Grandfather     stomach  . Colon cancer Neg Hx     ROS:  Pertinent items are noted in HPI.  Otherwise, a comprehensive ROS was negative.  Exam:   BP 132/88 (BP Location: Right Arm, Patient Position: Sitting, Cuff Size: Large)   Pulse 80   Resp 16   Ht 5' (1.524 m)   Wt 112 lb (50.8 kg)   LMP 02/09/1996   BMI 21.87 kg/m     Height: 5' (152.4 cm)  Ht Readings from Last 3 Encounters:  01/20/16 5' (1.524 m)  12/22/15 5' (1.524 m)  06/03/15 5' (1.524 m)   General appearance: alert, cooperative and appears stated age Head: Normocephalic, without obvious abnormality, atraumatic Neck: no adenopathy, supple, symmetrical, trachea midline and thyroid normal to inspection and palpation Abdomen: soft, non-tender; bowel sounds normal; no masses,  no organomegaly Extremities: extremities normal, atraumatic, no cyanosis or edema Skin: Skin color, texture, turgor normal. No rashes or lesions Lymph nodes: Cervical, supraclavicular, and axillary nodes normal. No abnormal inguinal nodes palpated Neurologic: Grossly normal  Pelvic: External genitalia:  no lesions              Urethra:  normal appearing urethra with no masses, tenderness or lesions              Bartholins and Skenes: normal                 Vagina: significant atrophic changes, erythema and watery discharge noted.  Touched tissue with scopette and scant bleeding note.  No lesions.              Cervix: absent              Pap taken: No. Bimanual Exam:  Uterus:  uterus absent              Adnexa: fullness on right side--stool vs ovarian lesion?               Rectovaginal: Confirms               Anus:  normal sphincter tone, no lesions  Chaperone was present for exam.  A:   Watery discharge with associated PMP bleeding Significant vaginal atrophic changes H/o TAH/USO due to benign indicated in 1988 RLQ fullness/mass noted on exam today H/O breast cancer and bilateral mastectomies 2013.  S/p chemo,  radiation.  Now on Tamoxifen. OAB with significant nocuria (may be some related to significant vaginal atrophy)  P:   Mammogram guidelines reviewed with pt pap smear not indicated D/w pt treatment of vaginal findings.  She and I both desire not to use estrogens so will start with coconut oil vaginally 2-3 times weekly.  Will recheck 8 -12 weeks Pt will return for PUS to assess RLQ finding/ovaries Options for OAB treatment discussed.  Pt has used oxytrol patches in the past but stopped due to "concerns".  Reviewed these are OTC and what that means from safety standpoint.  Myrbetriq discussed as well.  Pt is going to do her own "research" and let me know if desires rx.  Lenghty visit with pt.  ~50 minutes spent with patient with >50% of time spent in face to face discussion of above.

## 2016-01-20 NOTE — Patient Instructions (Addendum)
Medications for you to consider for urinary frequency:  myrbetriq or oxytrol patches  Coconut oil use:  Apply small amount 1/4 to 1/2 tsp vaginally two to three nights weekly.

## 2016-01-21 ENCOUNTER — Encounter: Payer: Self-pay | Admitting: Obstetrics & Gynecology

## 2016-01-21 DIAGNOSIS — N3281 Overactive bladder: Secondary | ICD-10-CM | POA: Insufficient documentation

## 2016-01-21 DIAGNOSIS — N952 Postmenopausal atrophic vaginitis: Secondary | ICD-10-CM | POA: Insufficient documentation

## 2016-01-27 ENCOUNTER — Ambulatory Visit (INDEPENDENT_AMBULATORY_CARE_PROVIDER_SITE_OTHER): Payer: Medicare Other

## 2016-01-27 ENCOUNTER — Other Ambulatory Visit: Payer: Self-pay

## 2016-01-27 ENCOUNTER — Encounter: Payer: Self-pay | Admitting: Obstetrics & Gynecology

## 2016-01-27 ENCOUNTER — Ambulatory Visit (INDEPENDENT_AMBULATORY_CARE_PROVIDER_SITE_OTHER): Payer: Medicare Other | Admitting: Obstetrics & Gynecology

## 2016-01-27 VITALS — BP 143/77 | HR 81 | Resp 12 | Ht 60.0 in | Wt 112.0 lb

## 2016-01-27 DIAGNOSIS — R1903 Right lower quadrant abdominal swelling, mass and lump: Secondary | ICD-10-CM | POA: Diagnosis not present

## 2016-01-27 DIAGNOSIS — N950 Postmenopausal bleeding: Secondary | ICD-10-CM

## 2016-01-27 DIAGNOSIS — N3281 Overactive bladder: Secondary | ICD-10-CM

## 2016-01-27 NOTE — Progress Notes (Signed)
70 y.o. G0P0000 Married Caucasian female here for pelvic ultrasound due to mass noted on physical exam.  Pt was initially seen due to PMP bleeding due to vaginal atrophy.  Patient's last menstrual period was 02/09/1996.  Contraception: PMP, hysterectomy  Findings:  UTERUS: absent EMS: absent ADNEXA: Left ovary: surgically absent       Right ovary: 1.4 x 0.8 x 0.5cm CUL DE SAC: no free fluid  Discussion:  Findings reviewed with pt.  Assessment:  RLQ mass, normal PUS OAB Vaginal atrophy  Plan:  Recheck 3-4 months Pt will call and let me know if she decides to start the oxytrol patches and has side effects.  Myrbetriq can decrease tamoxifen effectiveness so would try oral oxybutynin   Pt would like to see a urologist for considering of a TENS unit.  She will let me know if she wants a referral.  ~15 minutes spent with patient >50% of time was in face to face discussion of above.

## 2016-03-02 ENCOUNTER — Other Ambulatory Visit: Payer: Self-pay | Admitting: *Deleted

## 2016-03-02 ENCOUNTER — Telehealth: Payer: Self-pay | Admitting: *Deleted

## 2016-03-02 NOTE — Telephone Encounter (Signed)
This RN returned call to pt per her concern left on VM stating she has not been contacted about the low dose CT ordered per visit with Mike Craze.  This RN noted order with requested date for mid Feb 2018.  This RN sent an in box message to [facility] per above.  Informed pt of above and requested her to call this RN if she has not heard from Central scheduling by end of this week.

## 2016-03-08 DIAGNOSIS — G479 Sleep disorder, unspecified: Secondary | ICD-10-CM | POA: Diagnosis not present

## 2016-03-08 DIAGNOSIS — M8580 Other specified disorders of bone density and structure, unspecified site: Secondary | ICD-10-CM | POA: Diagnosis not present

## 2016-03-08 DIAGNOSIS — F419 Anxiety disorder, unspecified: Secondary | ICD-10-CM | POA: Diagnosis not present

## 2016-03-08 DIAGNOSIS — Z0000 Encounter for general adult medical examination without abnormal findings: Secondary | ICD-10-CM | POA: Diagnosis not present

## 2016-03-08 DIAGNOSIS — M5124 Other intervertebral disc displacement, thoracic region: Secondary | ICD-10-CM | POA: Diagnosis not present

## 2016-03-08 DIAGNOSIS — C50919 Malignant neoplasm of unspecified site of unspecified female breast: Secondary | ICD-10-CM | POA: Diagnosis not present

## 2016-03-08 DIAGNOSIS — G620 Drug-induced polyneuropathy: Secondary | ICD-10-CM | POA: Diagnosis not present

## 2016-03-10 DIAGNOSIS — N3941 Urge incontinence: Secondary | ICD-10-CM | POA: Diagnosis not present

## 2016-03-10 DIAGNOSIS — R351 Nocturia: Secondary | ICD-10-CM | POA: Diagnosis not present

## 2016-03-10 DIAGNOSIS — R350 Frequency of micturition: Secondary | ICD-10-CM | POA: Diagnosis not present

## 2016-03-23 DIAGNOSIS — N3941 Urge incontinence: Secondary | ICD-10-CM | POA: Diagnosis not present

## 2016-03-23 DIAGNOSIS — M62838 Other muscle spasm: Secondary | ICD-10-CM | POA: Diagnosis not present

## 2016-03-23 DIAGNOSIS — M6281 Muscle weakness (generalized): Secondary | ICD-10-CM | POA: Diagnosis not present

## 2016-03-23 DIAGNOSIS — R350 Frequency of micturition: Secondary | ICD-10-CM | POA: Diagnosis not present

## 2016-03-25 ENCOUNTER — Other Ambulatory Visit: Payer: Self-pay | Admitting: *Deleted

## 2016-03-29 ENCOUNTER — Encounter (HOSPITAL_COMMUNITY): Payer: Self-pay

## 2016-03-29 ENCOUNTER — Ambulatory Visit (HOSPITAL_COMMUNITY)
Admission: RE | Admit: 2016-03-29 | Discharge: 2016-03-29 | Disposition: A | Discharge status: Home / Self Care | Payer: Medicare Other | Source: Ambulatory Visit | Attending: Adult Health | Admitting: Adult Health

## 2016-03-29 DIAGNOSIS — R350 Frequency of micturition: Secondary | ICD-10-CM | POA: Diagnosis not present

## 2016-03-29 DIAGNOSIS — N3941 Urge incontinence: Secondary | ICD-10-CM | POA: Diagnosis not present

## 2016-03-29 DIAGNOSIS — M6281 Muscle weakness (generalized): Secondary | ICD-10-CM | POA: Diagnosis not present

## 2016-03-29 DIAGNOSIS — M62838 Other muscle spasm: Secondary | ICD-10-CM | POA: Diagnosis not present

## 2016-03-29 DIAGNOSIS — Z87891 Personal history of nicotine dependence: Secondary | ICD-10-CM

## 2016-04-09 DIAGNOSIS — R350 Frequency of micturition: Secondary | ICD-10-CM | POA: Diagnosis not present

## 2016-04-09 DIAGNOSIS — M62838 Other muscle spasm: Secondary | ICD-10-CM | POA: Diagnosis not present

## 2016-04-09 DIAGNOSIS — M6281 Muscle weakness (generalized): Secondary | ICD-10-CM | POA: Diagnosis not present

## 2016-04-09 DIAGNOSIS — N3941 Urge incontinence: Secondary | ICD-10-CM | POA: Diagnosis not present

## 2016-04-15 DIAGNOSIS — H25013 Cortical age-related cataract, bilateral: Secondary | ICD-10-CM | POA: Diagnosis not present

## 2016-04-15 DIAGNOSIS — D3132 Benign neoplasm of left choroid: Secondary | ICD-10-CM | POA: Diagnosis not present

## 2016-04-15 DIAGNOSIS — H2513 Age-related nuclear cataract, bilateral: Secondary | ICD-10-CM | POA: Diagnosis not present

## 2016-04-15 DIAGNOSIS — H04123 Dry eye syndrome of bilateral lacrimal glands: Secondary | ICD-10-CM | POA: Diagnosis not present

## 2016-04-23 DIAGNOSIS — R350 Frequency of micturition: Secondary | ICD-10-CM | POA: Diagnosis not present

## 2016-04-23 DIAGNOSIS — N3941 Urge incontinence: Secondary | ICD-10-CM | POA: Diagnosis not present

## 2016-04-23 DIAGNOSIS — M62838 Other muscle spasm: Secondary | ICD-10-CM | POA: Diagnosis not present

## 2016-04-23 DIAGNOSIS — M6281 Muscle weakness (generalized): Secondary | ICD-10-CM | POA: Diagnosis not present

## 2016-04-23 DIAGNOSIS — M545 Low back pain: Secondary | ICD-10-CM | POA: Diagnosis not present

## 2016-04-27 ENCOUNTER — Ambulatory Visit (INDEPENDENT_AMBULATORY_CARE_PROVIDER_SITE_OTHER): Payer: Medicare Other | Admitting: Obstetrics & Gynecology

## 2016-04-27 ENCOUNTER — Encounter: Payer: Self-pay | Admitting: Obstetrics & Gynecology

## 2016-04-27 ENCOUNTER — Other Ambulatory Visit (HOSPITAL_COMMUNITY)
Admission: RE | Admit: 2016-04-27 | Discharge: 2016-04-27 | Disposition: A | Discharge status: Home / Self Care | Payer: Medicare Other | Source: Ambulatory Visit | Attending: Obstetrics & Gynecology | Admitting: Obstetrics & Gynecology

## 2016-04-27 VITALS — BP 114/72 | HR 68 | Resp 16 | Wt 110.0 lb

## 2016-04-27 DIAGNOSIS — N3281 Overactive bladder: Secondary | ICD-10-CM | POA: Diagnosis not present

## 2016-04-27 DIAGNOSIS — N952 Postmenopausal atrophic vaginitis: Secondary | ICD-10-CM | POA: Diagnosis not present

## 2016-04-27 DIAGNOSIS — Z124 Encounter for screening for malignant neoplasm of cervix: Secondary | ICD-10-CM

## 2016-04-27 NOTE — Patient Instructions (Addendum)
Atrophic Vaginitis Atrophic vaginitis is a condition in which the tissues that line the vagina become dry and thin. This condition is most common in women who have stopped having regular menstrual periods (menopause). This usually starts when a woman is 55-69 years old. Estrogen helps to keep the vagina moist. It stimulates the vagina to produce a clear fluid that lubricates the vagina for sexual intercourse. This fluid also protects the vagina from infection. Lack of estrogen can cause the lining of the vagina to get thinner and dryer. The vagina may also shrink in size. It may become less elastic. Atrophic vaginitis tends to get worse over time as a woman's estrogen level drops. What are the causes? This condition is caused by the normal drop in estrogen that happens around the time of menopause. What increases the risk? Certain conditions or situations may lower a woman's estrogen level, which increases her risk of atrophic vaginitis. These include:  Taking medicine that blocks estrogen.  Having ovaries removed surgically.  Being treated for cancer with X-ray treatment (radiation) or medicines (chemotherapy).  Exercising very hard and often.  Having an eating disorder (anorexia).  Giving birth or breastfeeding.  Being over the age of 47.  Smoking. What are the signs or symptoms? Symptoms of this condition include:  Pain, soreness, or bleeding during sexual intercourse (dyspareunia).  Vaginal burning, irritation, or itching.  Pain or bleeding during a vaginal examination using a speculum (pelvic exam).  Loss of interest in sexual activity.  Having burning pain when passing urine.  Vaginal discharge that is brown or yellow. In some cases, there are no symptoms. How is this diagnosed? This condition is diagnosed with a medical history and physical exam. This will include a pelvic exam that checks whether the inside of your vagina appears pale, thin, or dry. Rarely, you may also  have other tests, including:  A urine test.  A test that checks the acid balance in your vaginal fluid (acid balance test). How is this treated? Treatment for this condition may depend on the severity of your symptoms. Treatment may include:  Using an over-the-counter vaginal lubricant before you have sexual intercourse.  Using a long-acting vaginal moisturizer.  Using low-dose vaginal estrogen for moderate to severe symptoms that do not respond to other treatments. Options include creams, tablets, and inserts (vaginal rings). Before using vaginal estrogen, tell your health care provider if you have a history of:  Breast cancer.  Endometrial cancer.  Blood clots.  Taking medicines. You may be able to take a daily pill for dyspareunia. Discuss all of the risks of this medicine with your health care provider. It is usually not recommended for women who have a family history or personal history of breast cancer. If your symptoms are very mild and you are not sexually active, you may not need treatment. Follow these instructions at home:  Take medicines only as directed by your health care provider. Do not use herbal or alternative medicines unless your health care provider says that you can.  Use over-the-counter creams, lubricants, or moisturizers for dryness only as directed by your health care provider.  If your atrophic vaginitis is caused by menopause, discuss all of your menopausal symptoms and treatment options with your health care provider.  Do not douche.  Do not use products that can make your vagina dry. These include:  Scented feminine sprays.  Scented tampons.  Scented soaps.  If it hurts to have sex, talk with your sexual partner. Contact a health  care provider if:  Your discharge looks different than normal.  Your vagina has an unusual smell.  You have new symptoms.  Your symptoms do not improve with treatment.  Your symptoms get worse. This  information is not intended to replace advice given to you by your health care provider. Make sure you discuss any questions you have with your health care provider. Document Released: 06/11/2014 Document Revised: 07/03/2015 Document Reviewed: 01/16/2014 Elsevier Interactive Patient Education  2017 [employer].   Consider having a repeat pelvic exam in two years.

## 2016-04-27 NOTE — Progress Notes (Signed)
GYNECOLOGY  VISIT   HPI: 71 y.o. [id number] Married Caucasian female here for follow up of OAB.  She reports she did try the oxytrol patches and used them for about a month but decided to stop.  She did see Dr. Jeffie Pollock, urology, for consultation.  He felt physical therapy was a good idea which we discussed when she was here in December.  She has started that and does feel that it is helping.  She is seeing Corporate treasurer at [facility].  She has been very pleased with the physical therapy.  She's gone for five visits.  She feels she can control her bladder better and has more time with urges to the time when she needs to get to the bathroom.  She is working on bladder retraining as well.  She is having a little urinary leakage as she is holding her bladder for longer.    Denies vaginal bleeding.  Using topical coconut oil.  Wonders when she can stop.  GYNECOLOGIC HISTORY: Patient's last menstrual period was 02/09/1996. Contraception: PMP Menopausal hormone therapy: none  Patient Active Problem List   Diagnosis Date Noted  . OAB (overactive bladder) 01/21/2016  . Vaginal atrophy 01/21/2016  . Unspecified deficiency anemia 10/30/2013  . White coat syndrome without hypertension 06/26/2012  . Pain of left calf 06/26/2012  . Rectal bleeding 04/17/2012  . Anxiety   . TMJ click   . S/P chemotherapy, time since 4-12 weeks   . Rosacea   . History of external beam radiation therapy   . Red blood cell antibody positive, compatible PRBC difficult to obtain 12/29/2011  . Hypokalemia 10/14/2011  . Thrombocytopenia ([place]) 10/14/2011  . Neoplastic disease 09/28/2011  . Anemia associated with breast cancer treated with erythropoietin ([place]) 08/23/2011  . Malignant neoplasm of female breast ([place]) 05/19/2011  . Primary cancer of upper outer quadrant of right female breast ([place]) 05/06/2011    Past Medical History:  Diagnosis Date  . Anemia due to chemotherapy 08/23/2011  . Anxiety   . Breast cancer ([place])    bilateral infiltrating ductal carcinoma  . Hx of radiation therapy 01/11/12- 02/28/12   right chest wall/supraclav fossa/drain site/scar boost  . Mycosis fungoides    follows up with [facility] once to twice per year   . Rosacea   . S/P chemotherapy, time since 4-12 weeks   . TMJ click    improved after changing the way dental exams were done  . Urinary frequency     Past Surgical History:  Procedure Laterality Date  . ABDOMINAL HYSTERECTOMY  1988   partial with on ovary remaining.  . benign tumor removed from neck  1974  . BREAST BIOPSY  05/19/11   left  and right breast  . BREAST EXCISIONAL BIOPSY  1991   benign  . CHOLECYSTECTOMY    . MASTECTOMY  11/2011   bilat, [place] Left 04/06/2012   Procedure: MINOR REMOVAL PORT-A-CATH;  Surgeon: [name], MD;  Location: [place];  Service: General;  Laterality: Left;  . PORTACATH PLACEMENT  05/21/2011   Procedure: INSERTION PORT-A-CATH;  Surgeon: [name], MD;  Location: [facility];  Service: General;  Laterality: Left;    MEDS:  Reviewed in EPIC and UTD  ALLERGIES: Latex; Adhesive [tape]; Trazodone and nefazodone; Zofran [ondansetron hcl]; Diclofenac; and Naproxen  Family History  Problem Relation Age of Onset  . Cancer Maternal Aunt     breast to brain  . Cancer Paternal  Grandmother     breast and/or ovarian///unknown  . Stomach cancer Paternal Grandfather     stomach  . Colon cancer Neg Hx     SH:  Married, non smoker  Review of Systems  Genitourinary: Positive for urgency.  All other systems reviewed and are negative.   PHYSICAL EXAMINATION:    BP 114/72 (BP Location: Right Arm, Patient Position: Sitting, Cuff Size: Normal)   Pulse 68   Resp 16   Wt 110 lb (49.9 kg)   LMP 02/09/1996   BMI 21.48 kg/m     General appearance: alert, cooperative and appears stated age Abdomen: soft, non-tender; bowel sounds normal; no masses,  no organomegaly  Pelvic:  External genitalia:  no lesions              Urethra:  normal appearing urethra with no masses, tenderness or lesions              Bartholins and Skenes: normal                 Vagina: atrophic changes but discharge and previous atrophic vagitis symptoms are much improved              Cervix: no lesions    Pap:  obtained              Bimanual Exam:  Uterus:  uterus absent              Adnexa: no mass, fullness, tenderness              Anus:  no lesions  Chaperone was present for exam.  Assessment: Significant atrophic vaginitis, improved OAB with improvement after PT PMP bleeding, resolved H/o breast cancer, on Tamoxifen  Plan: Pap smear obtained today Pt advised she can decrease frequency of coconut oil use.   Will continue to follow up with urology PT. Knows she can see me for any future issues.   ~20 minutes spent with patient >50% of time was in face to face discussion of above.

## 2016-04-29 LAB — CYTOLOGY - PAP: DIAGNOSIS: NEGATIVE

## 2016-05-04 DIAGNOSIS — M545 Low back pain: Secondary | ICD-10-CM | POA: Diagnosis not present

## 2016-05-04 DIAGNOSIS — M6281 Muscle weakness (generalized): Secondary | ICD-10-CM | POA: Diagnosis not present

## 2016-05-04 DIAGNOSIS — M62838 Other muscle spasm: Secondary | ICD-10-CM | POA: Diagnosis not present

## 2016-05-04 DIAGNOSIS — N3941 Urge incontinence: Secondary | ICD-10-CM | POA: Diagnosis not present

## 2016-05-17 ENCOUNTER — Other Ambulatory Visit: Payer: Medicare Other

## 2016-05-20 DIAGNOSIS — M6281 Muscle weakness (generalized): Secondary | ICD-10-CM | POA: Diagnosis not present

## 2016-05-20 DIAGNOSIS — R102 Pelvic and perineal pain: Secondary | ICD-10-CM | POA: Diagnosis not present

## 2016-05-20 DIAGNOSIS — M545 Low back pain: Secondary | ICD-10-CM | POA: Diagnosis not present

## 2016-05-20 DIAGNOSIS — N3941 Urge incontinence: Secondary | ICD-10-CM | POA: Diagnosis not present

## 2016-05-20 DIAGNOSIS — M62838 Other muscle spasm: Secondary | ICD-10-CM | POA: Diagnosis not present

## 2016-05-24 ENCOUNTER — Ambulatory Visit: Payer: Medicare Other | Admitting: Oncology

## 2016-05-24 ENCOUNTER — Other Ambulatory Visit (HOSPITAL_BASED_OUTPATIENT_CLINIC_OR_DEPARTMENT_OTHER): Payer: Medicare Other

## 2016-05-24 ENCOUNTER — Ambulatory Visit (HOSPITAL_BASED_OUTPATIENT_CLINIC_OR_DEPARTMENT_OTHER): Payer: Medicare Other | Admitting: Oncology

## 2016-05-24 VITALS — HR 73 | Temp 98.1°F | Resp 18 | Ht 60.0 in | Wt 109.9 lb

## 2016-05-24 DIAGNOSIS — E559 Vitamin D deficiency, unspecified: Secondary | ICD-10-CM | POA: Diagnosis not present

## 2016-05-24 DIAGNOSIS — Z170 Estrogen receptor positive status [ER+]: Secondary | ICD-10-CM

## 2016-05-24 DIAGNOSIS — C50411 Malignant neoplasm of upper-outer quadrant of right female breast: Secondary | ICD-10-CM

## 2016-05-24 DIAGNOSIS — Z79899 Other long term (current) drug therapy: Secondary | ICD-10-CM

## 2016-05-24 DIAGNOSIS — C773 Secondary and unspecified malignant neoplasm of axilla and upper limb lymph nodes: Secondary | ICD-10-CM | POA: Diagnosis not present

## 2016-05-24 DIAGNOSIS — M8580 Other specified disorders of bone density and structure, unspecified site: Secondary | ICD-10-CM | POA: Diagnosis not present

## 2016-05-24 DIAGNOSIS — C50912 Malignant neoplasm of unspecified site of left female breast: Secondary | ICD-10-CM

## 2016-05-24 DIAGNOSIS — Z0000 Encounter for general adult medical examination without abnormal findings: Secondary | ICD-10-CM

## 2016-05-24 DIAGNOSIS — E7800 Pure hypercholesterolemia, unspecified: Secondary | ICD-10-CM | POA: Diagnosis not present

## 2016-05-24 DIAGNOSIS — C50819 Malignant neoplasm of overlapping sites of unspecified female breast: Secondary | ICD-10-CM | POA: Diagnosis not present

## 2016-05-24 DIAGNOSIS — R911 Solitary pulmonary nodule: Secondary | ICD-10-CM

## 2016-05-24 DIAGNOSIS — C50619 Malignant neoplasm of axillary tail of unspecified female breast: Secondary | ICD-10-CM

## 2016-05-24 LAB — CBC WITH DIFFERENTIAL/PLATELET
BASO%: 1.5 % (ref 0.0–2.0)
Basophils Absolute: 0.1 10*3/uL (ref 0.0–0.1)
EOS%: 1.5 % (ref 0.0–7.0)
Eosinophils Absolute: 0.1 10*3/uL (ref 0.0–0.5)
HCT: 37.5 % (ref 34.8–46.6)
HGB: 12.8 g/dL (ref 11.6–15.9)
LYMPH#: 1.4 10*3/uL (ref 0.9–3.3)
LYMPH%: 27.8 % (ref 14.0–49.7)
MCH: 31.7 pg (ref 25.1–34.0)
MCHC: 34.1 g/dL (ref 31.5–36.0)
MCV: 93 fL (ref 79.5–101.0)
MONO#: 0.4 10*3/uL (ref 0.1–0.9)
MONO%: 8.2 % (ref 0.0–14.0)
NEUT%: 61 % (ref 38.4–76.8)
NEUTROS ABS: 3 10*3/uL (ref 1.5–6.5)
Platelets: 171 10*3/uL (ref 145–400)
RBC: 4.03 10*6/uL (ref 3.70–5.45)
RDW: 12.7 % (ref 11.2–14.5)
WBC: 5 10*3/uL (ref 3.9–10.3)

## 2016-05-24 LAB — COMPREHENSIVE METABOLIC PANEL
ALT: 21 U/L (ref 0–55)
AST: 26 U/L (ref 5–34)
Albumin: 3.8 g/dL (ref 3.5–5.0)
Alkaline Phosphatase: 60 U/L (ref 40–150)
Anion Gap: 10 mEq/L (ref 3–11)
BUN: 20.3 mg/dL (ref 7.0–26.0)
CO2: 26 meq/L (ref 22–29)
Calcium: 9.4 mg/dL (ref 8.4–10.4)
Chloride: 105 mEq/L (ref 98–109)
Creatinine: 0.8 mg/dL (ref 0.6–1.1)
EGFR: 77 mL/min/{1.73_m2} — AB (ref >90)
GLUCOSE: 82 mg/dL (ref 70–140)
POTASSIUM: 3.7 meq/L (ref 3.5–5.1)
SODIUM: 141 meq/L (ref 136–145)
TOTAL PROTEIN: 6.8 g/dL (ref 6.4–8.3)
Total Bilirubin: 0.38 mg/dL (ref 0.20–1.20)

## 2016-05-24 LAB — DRAW EXTRA CLOT TUBE

## 2016-05-24 NOTE — Progress Notes (Signed)
ID: NIGERIA LASSETER   DOB: 12/31/1945  MR#: [id number]  HWT#:[id number]  PCP: [name], MD GYN:  SU: [name], [name] OTHER JZ:PHXTA [name]   HISTORY OF PRESENT ILLNESS: From the original intake note:  She palpated a right breast mass March 2013 and presented for a mammogram. This confirmed the presence of a 1.9 cm mass in the upper outer quadrants. And adjacent mass was noted which was possibly thought to represent a intramammary lymph node. A right axillary lymph node was also noted to be enlarged. Biopsy of the upper outer quadrant mass showed a grade 2 invasive ductal carcinoma which was ER/PR positive HER-2 positive. Ki 67 was 12%. An MRI of the breast was performed 05/10/2011 which showed an overall area of the right breast measuring 5.0 x 2.6 x 2.2 cm of clumped linear enhancement. Majority of this was felt to represent DCIS. A small area of clumped enhancement was noted in the left breast which was not seen on the mammogram. A biopsy of this area was recommended. A biopsy of the right axillary lymph node was also positive for invasive ductal carcinoma.   The patient's subsequent history is as detailed below  INTERVAL HISTORY: Angelisse returns today for follow-up of her estrogen receptor positive breast cancer. Continues on tamoxifen, generally with good tolerance. She blames the drug for vaginal dryness and for skin dryness. Actually in my experience tamoxifen is more frequently associated with vaginal wetness or discharge. Tamoxifen of course does not "take away her estrogen", so I suspect these symptoms are really due to menopause and not due to the tamoxifen itself.  Aside from that she generally tolerates the medication well. She obtains it at a good price.  She did have an episode of vaginal bleeding, which was evaluated by Dr. Sabra Heck. This was felt to be primarily due to vaginal atrophy. The patient has been using coconut  oil and those symptoms are much improved. Recall that she is status post hysterectomy.  The patient's father who was 15 years old died in 17-Mar-2022. There are significant stresses related to this.  REVIEW OF SYSTEMS: Andrina she is terrified of anything happening to either arm, since she had 19 lymph nodes removed from the right axilla and 5 from the left, and when she had her blood pressure taking in her right calf a year ago it caused swelling which persists, she sets. She also did not want any lab draws from the arms and requested to have the labs drawn from her foot. She is not exercising regularly at present Aside from these issues a detailed review of systems today was noncontributory  PAST MEDICAL HISTORY: Past Medical History:  Diagnosis Date  . Anemia due to chemotherapy 08/23/2011  . Anxiety   . Breast cancer ([place])    bilateral infiltrating ductal carcinoma  . Hx of radiation therapy 01/11/12- 02/28/12   right chest wall/supraclav fossa/drain site/scar boost  . Mycosis fungoides    follows up with [facility] once to twice per year   . Rosacea   . S/P chemotherapy, time since 4-12 weeks   . TMJ click    improved after changing the way dental exams were done  . Urinary frequency     PAST SURGICAL HISTORY: Past Surgical History:  Procedure Laterality Date  . ABDOMINAL HYSTERECTOMY  1988   partial with on ovary remaining.  . benign tumor removed from neck  1974  . BREAST BIOPSY  05/19/11  left  and right breast  . BREAST EXCISIONAL BIOPSY  1991   benign  . CHOLECYSTECTOMY    . MASTECTOMY  11/2011   bilat, [place] Left 04/06/2012   Procedure: MINOR REMOVAL PORT-A-CATH;  Surgeon: [name], MD;  Location: [place];  Service: General;  Laterality: Left;  . PORTACATH PLACEMENT  05/21/2011   Procedure: INSERTION PORT-A-CATH;  Surgeon: [name], MD;  [facility];  Service: General;  Laterality: Left;    FAMILY  HISTORY Family History  Problem Relation Age of Onset  . Cancer Maternal Aunt     breast to brain  . Cancer Paternal Grandmother     breast and/or ovarian///unknown  . Stomach cancer Paternal Grandfather     stomach  . Colon cancer Neg Hx    the patient's fatherDied at age 39 in January [id number] from complications of diabetes. The patient had no brothers, 3 sisters. One of the patient's mother is 2 sisters was diagnosed with breast cancer in her late 41s. There is no other history of breast or ovarian cancer in the family. The patient tells me she was evaluated for genetic testing at [facility], but that the insurance would not approve of the test.  GYNECOLOGIC HISTORY: Menarche age 12, she is GX P0. She underwent hysterectomy and unilateral salpingo-oophorectomy in 1988. She took hormone replacement for approximately 3 years.  SOCIAL HISTORY: She used to work at [employer], but is now retired. Her husband, [name], used to work for the [employer]. He has a son from a prior marriage, and 2 grandchildren. The patient attends [employer].   ADVANCED DIRECTIVES:  patient's husband is her healthcare power of attorney  HEALTH MAINTENANCE: Social History  Substance Use Topics  . Smoking status: Former Smoker    Packs/day: 1.00    Years: 20.00    Types: Cigarettes    Quit date: 02/08/1986  . Smokeless tobacco: Never Used  . Alcohol use No     Colonoscopy:  April 2014, [facility]  PAP:  Bone density: 02/23/2012, osteopenia, [facility]  Lipid panel:  Allergies  Allergen Reactions  . Latex Dermatitis and Rash  . Adhesive [Tape]     ALLERGIC TO OP-SITE......USE OP-SITE FLEXIGRID INSTEAD !!!  . Trazodone And Nefazodone Nausea Only  . Zofran [Ondansetron Hcl] Swelling    Swelling&redness to face!!  . Diclofenac Other (See Comments)    Fever, chills  . Naproxen Other (See Comments)    Fever, chills    Current Outpatient  Prescriptions  Medication Sig Dispense Refill  . aspirin EC 81 MG tablet Take 1 tablet (81 mg total) by mouth daily.    . calcium carbonate (OS-CAL) 600 MG TABS Take 750 mg by mouth daily.     [name] gabapentin (NEURONTIN) 300 MG capsule Take 2 capsules (600 mg total) by mouth at bedtime. 180 capsule 3  . Melatonin 3 MG CAPS Take 0.5 capsules by mouth at bedtime.    . Multiple Vitamins-Minerals (MULTIVITAMIN PO) Take 1 capsule by mouth daily.    . NON FORMULARY Take by mouth. Flax seed    . tamoxifen (NOLVADEX) 20 MG tablet Take one tablet by mouth one time daily 90 tablet 4   No current facility-administered medications for this visit.     OBJECTIVE: Middle-aged white woman In no acute distress  Vitals:   05/24/16 0904  Pulse: (!) 7  Resp: 18  Temp: 98.1 F (36.7  C)     Body mass index is 21.46 kg/m.    ECOG FS: 0 Filed Weights   05/24/16 0904  Weight: 109 lb 14.4 oz (49.9 kg)   Sclerae unicteric, pupils round and equal Oropharynx clear and moist No cervical or supraclavicular adenopathy Lungs no rales or rhonchi Heart regular rate and rhythm Abd soft, nontender, positive bowel sounds; the patient was concerned that there may be a mass in the epigastric area. Careful palpation and inspection of this area did not reveal any suspicious change MSK no focal spinal tenderness, no upper extremity lymphedema Neuro: nonfocal, well oriented, appropriate affect Breasts: She is status post bilateral mastectomies. There is no evidence of local recurrence. Both axillae are benign.  LAB RESULTS: Lab Results  Component Value Date   WBC 5.7 11/17/2015   NEUTROABS 3.1 11/17/2015   HGB 12.5 11/17/2015   HCT 36.6 11/17/2015   MCV 92.2 11/17/2015   PLT 176 11/17/2015      Chemistry      Component Value Date/Time   NA 141 11/17/2015 1224   K 3.8 11/17/2015 1224   CL 104 06/28/2012 0950   CO2 25 11/17/2015 1224   BUN 14.9 11/17/2015 1224   CREATININE 0.8 11/17/2015 1224      Component  Value Date/Time   CALCIUM 9.6 11/17/2015 1224   ALKPHOS 63 11/17/2015 1224   AST 27 11/17/2015 1224   ALT 20 11/17/2015 1224   BILITOT <0.30 11/17/2015 1224      Lab Results  Component Value Date   LABCA2 21 02/18/2012     STUDIES: we reviewed her prior CT scan of the chest which showed some changes which at this point should be rechecked   ASSESSMENT: 71 y.o. [place] woman  (1) status post right breast and axillary lymph node biopsy 05/04/2011, both positive for a grade 2 invasive ductal carcinoma, cT2 pN1 or stage IIB,estrogen receptor 82% and progesterone receptor 92% positive, with an MIB-1 of 12%, and HER-2 amplification by CISH with a ratio of 2.86  (2) biopsy 05/19/2011 of a second right breast area showed low-grade invasive ductal carcinoma, estrogen and progesterone receptor both 100% positive, with an MIB-1 of 13%, and no HER-2 amplification  (3) left breast biopsy 05/19/2011 showed a low-grade invasive ductal carcinoma, e-cadherin positive, HER-2 negative  (4) neoadjuvant treatment consisted of carboplatin/ docetaxel/ trastuzumab x6, started 06/02/2011 and completed 09/29/2011  (5) status post bilateral m[facility]ctomies at [facility] October 2013 showing  (a) on the right, a residual 1.2 cm invasive ductal carcinoma in the breast, with 3 of 19 lymph nodes involved; an additional lymph node showed only isolated tumor cells, so this is a ypT1c ypN1 result  (b) on the left, there was a 1 mm area of residual invasive ductal carcinoma, with all 5 sentinel lymph nodes negative  (6) the patient completed postmastectomy radiation to the right chest wall and right supraclavicular fossa 02/28/2012  (7) status post-op trastuzumab/ pertuzumab x3, discontinued 01/28/2012  (8) trastuzumab continued 02/18/2012, completed April 2014. Final echocardiogram 06/26/2012 showed a well preserved ejection fraction.  (9) tamoxifen started 03/25/2012  (10) osteopenia, with bone density  05/23/2014 showing a T score of -1.9  (11) history of greater than 20 pack year smoking, low-dose screening chest CT SEPT 2015 negative  PLAN:  I spent well over 30 minutes with Opal Sidles with most of that time spent discussing her breast cancer and associated problems.   As far as her breast cancer is concerned she is now 4-1/2  years out from definitive surgery for her breast cancer with no evidence of disease recurrence. This is very favorable.  She is beginning to consider reconstruction. I assured her whether or not she is on tamoxifen does not make a difference to that and the fact that she has waited all this time also does not make a difference. At this point however she is not ready for aplastic surgery referral.  She is very concerned about having anything done to either arm including let pressure or blood draws. I explained that actually it is quite safe for her to have blood draws particularly in the left arm where she had only 5 lymph nodes removed. The chance of her getting cellulitis or swelling or lymphedema from a blood draw or blood pressure is exceedingly low. Nevertheless she is not reassured and she would like to have her blood pressure drawn from a follow.  She is concerned because she had a blood pressure cuff while and very tight around her left calf and she feels she has had problems with the left calf ever since. Accordingly if she has blood drawn from her foot it should be from the right foot.  She would like me to draw a lipid panel. She understands when this is drawn in the specialists office it may not be paid for. Also she "bad" for a CT of the chest. She did have some abnormalities in a prior CT scan should be followed and I have gone ahead and placed that order for a low-dose chest CT scan without contrast for her. I have asked her also to make sure that has been preapproved so she does not end up with a terrible pedal.  Otherwise she will see our survivorship nurse  practitioner in 6 months and she will see me again in one year. At that point we will consider whether she should go off tamoxifen or continue for an additional 5 years  Finally we discussed survivorship. She agreed to a meeting with our survivorship nurse practitioner in 6 months. She will return to see me in one year. She knows to call for any problems that may develop before then. MAGRINAT,GUSTAV C    05/24/2016

## 2016-05-25 LAB — LIPID PANEL
CHOL/HDL RATIO: 2.1 ratio (ref 0.0–4.4)
CHOLESTEROL TOTAL: 154 mg/dL (ref 100–199)
HDL: 74 mg/dL (ref >39)
LDL Calculated: 66 mg/dL (ref 0–99)
TRIGLYCERIDES: 72 mg/dL (ref 0–149)
VLDL Cholesterol Cal: 14 mg/dL (ref 5–40)

## 2016-06-04 ENCOUNTER — Ambulatory Visit (HOSPITAL_COMMUNITY)
Admission: RE | Admit: 2016-06-04 | Discharge: 2016-06-04 | Disposition: A | Discharge status: Home / Self Care | Payer: Medicare Other | Source: Ambulatory Visit | Attending: Oncology | Admitting: Oncology

## 2016-06-04 DIAGNOSIS — C50411 Malignant neoplasm of upper-outer quadrant of right female breast: Secondary | ICD-10-CM | POA: Diagnosis not present

## 2016-06-04 DIAGNOSIS — R918 Other nonspecific abnormal finding of lung field: Secondary | ICD-10-CM | POA: Insufficient documentation

## 2016-06-04 DIAGNOSIS — R911 Solitary pulmonary nodule: Secondary | ICD-10-CM | POA: Diagnosis not present

## 2016-06-04 DIAGNOSIS — Z170 Estrogen receptor positive status [ER+]: Secondary | ICD-10-CM | POA: Diagnosis not present

## 2016-06-04 DIAGNOSIS — C50619 Malignant neoplasm of axillary tail of unspecified female breast: Secondary | ICD-10-CM

## 2016-06-04 DIAGNOSIS — N200 Calculus of kidney: Secondary | ICD-10-CM | POA: Diagnosis not present

## 2016-06-04 DIAGNOSIS — I700 Atherosclerosis of aorta: Secondary | ICD-10-CM | POA: Insufficient documentation

## 2016-06-07 DIAGNOSIS — N3941 Urge incontinence: Secondary | ICD-10-CM | POA: Diagnosis not present

## 2016-06-07 DIAGNOSIS — R102 Pelvic and perineal pain: Secondary | ICD-10-CM | POA: Diagnosis not present

## 2016-06-07 DIAGNOSIS — R351 Nocturia: Secondary | ICD-10-CM | POA: Diagnosis not present

## 2016-06-07 DIAGNOSIS — N952 Postmenopausal atrophic vaginitis: Secondary | ICD-10-CM | POA: Diagnosis not present

## 2016-06-07 DIAGNOSIS — R350 Frequency of micturition: Secondary | ICD-10-CM | POA: Diagnosis not present

## 2016-06-22 DIAGNOSIS — M6281 Muscle weakness (generalized): Secondary | ICD-10-CM | POA: Diagnosis not present

## 2016-06-22 DIAGNOSIS — M62838 Other muscle spasm: Secondary | ICD-10-CM | POA: Diagnosis not present

## 2016-06-22 DIAGNOSIS — R350 Frequency of micturition: Secondary | ICD-10-CM | POA: Diagnosis not present

## 2016-06-22 DIAGNOSIS — N3941 Urge incontinence: Secondary | ICD-10-CM | POA: Diagnosis not present

## 2016-07-21 DIAGNOSIS — M545 Low back pain: Secondary | ICD-10-CM | POA: Diagnosis not present

## 2016-07-21 DIAGNOSIS — M62838 Other muscle spasm: Secondary | ICD-10-CM | POA: Diagnosis not present

## 2016-07-21 DIAGNOSIS — M6281 Muscle weakness (generalized): Secondary | ICD-10-CM | POA: Diagnosis not present

## 2016-07-21 DIAGNOSIS — N3941 Urge incontinence: Secondary | ICD-10-CM | POA: Diagnosis not present

## 2016-09-08 ENCOUNTER — Other Ambulatory Visit: Payer: Self-pay | Admitting: Oncology

## 2016-09-08 DIAGNOSIS — C50411 Malignant neoplasm of upper-outer quadrant of right female breast: Secondary | ICD-10-CM

## 2016-09-20 ENCOUNTER — Other Ambulatory Visit: Payer: Self-pay | Admitting: *Deleted

## 2016-09-20 ENCOUNTER — Telehealth: Payer: Self-pay | Admitting: *Deleted

## 2016-09-20 DIAGNOSIS — M7989 Other specified soft tissue disorders: Secondary | ICD-10-CM

## 2016-09-20 DIAGNOSIS — C50411 Malignant neoplasm of upper-outer quadrant of right female breast: Secondary | ICD-10-CM

## 2016-09-20 NOTE — Telephone Encounter (Signed)
This RN spoke with pt per call stating concern for swelling of her Right let " I had swelling in my left leg previously which Dr Jana Hakim assessed when I saw him in April "  " I am on tamoxifen and understand that swelling can be a life threatening - even death side effect "  Per above - [name] states the swelling started " maybe like a month or more ago "  She denies any focal pain or difficulty walking nor any SOB.  Per above - doppler will be obtained for review and further recommendations.  Kirsten Gibson verbalized understanding.

## 2016-09-21 ENCOUNTER — Other Ambulatory Visit: Payer: Self-pay | Admitting: *Deleted

## 2016-09-21 ENCOUNTER — Ambulatory Visit (HOSPITAL_COMMUNITY)
Admission: RE | Admit: 2016-09-21 | Discharge: 2016-09-21 | Disposition: A | Discharge status: Home / Self Care | Payer: Medicare Other | Source: Ambulatory Visit | Attending: Oncology | Admitting: Oncology

## 2016-09-21 DIAGNOSIS — C50411 Malignant neoplasm of upper-outer quadrant of right female breast: Secondary | ICD-10-CM

## 2016-09-21 DIAGNOSIS — M7989 Other specified soft tissue disorders: Secondary | ICD-10-CM | POA: Diagnosis not present

## 2016-09-21 DIAGNOSIS — R609 Edema, unspecified: Secondary | ICD-10-CM

## 2016-09-21 NOTE — Progress Notes (Signed)
VASCULAR LAB PRELIMINARY  PRELIMINARY  PRELIMINARY  PRELIMINARY  Bilateral lower extremity venous duplex completed.    Preliminary report:  Bilateral:  No evidence of DVT, superficial thrombosis, or Baker's Cyst.   Sherwin Hollingshed, RVS 09/21/2016, 2:33 PM

## 2016-09-23 ENCOUNTER — Other Ambulatory Visit: Payer: Self-pay | Admitting: *Deleted

## 2016-09-23 DIAGNOSIS — C50411 Malignant neoplasm of upper-outer quadrant of right female breast: Secondary | ICD-10-CM

## 2016-09-23 DIAGNOSIS — M7989 Other specified soft tissue disorders: Secondary | ICD-10-CM

## 2016-11-01 ENCOUNTER — Other Ambulatory Visit: Payer: Self-pay | Admitting: Oncology

## 2016-11-09 ENCOUNTER — Telehealth: Payer: Self-pay | Admitting: *Deleted

## 2016-11-09 DIAGNOSIS — C50919 Malignant neoplasm of unspecified site of unspecified female breast: Secondary | ICD-10-CM

## 2016-11-09 DIAGNOSIS — C50411 Malignant neoplasm of upper-outer quadrant of right female breast: Secondary | ICD-10-CM

## 2016-11-09 DIAGNOSIS — C50619 Malignant neoplasm of axillary tail of unspecified female breast: Secondary | ICD-10-CM

## 2016-11-09 DIAGNOSIS — D630 Anemia in neoplastic disease: Principal | ICD-10-CM

## 2016-11-09 DIAGNOSIS — Z170 Estrogen receptor positive status [ER+]: Secondary | ICD-10-CM

## 2016-11-09 NOTE — Telephone Encounter (Signed)
VM left by pt stating she wanted to follow up on concerns-  She wants to verify that orders for her history of anemia is ordered with upcoming lab draw.  She also states she has not heard from Dr [name]'s office per prior call and referral.  This RN attempted to return call - a man answered who continued to state " I can't hear you " " hello".  Call discontinued.  This RN will attempt again at later time.

## 2016-11-10 ENCOUNTER — Other Ambulatory Visit: Payer: Self-pay

## 2016-11-10 ENCOUNTER — Telehealth: Payer: Self-pay

## 2016-11-10 NOTE — Telephone Encounter (Signed)
lvm with Dr [name]'s office giving pt name and contact # so they can schedule an appt for her.  Pt also instructed to contact Dr [name]'s office if she has not heard from them by 10/5.

## 2016-11-16 IMAGING — CR DG CHEST 2V
2 series · 2 of 2 positions shown · non-contrast
Comparison: PA and lateral chest 07/29/2012.  CT chest 10/30/2013.

CLINICAL DATA: History of breast cancer.

EXAM:
CHEST  2 VIEW

[w chest pa]
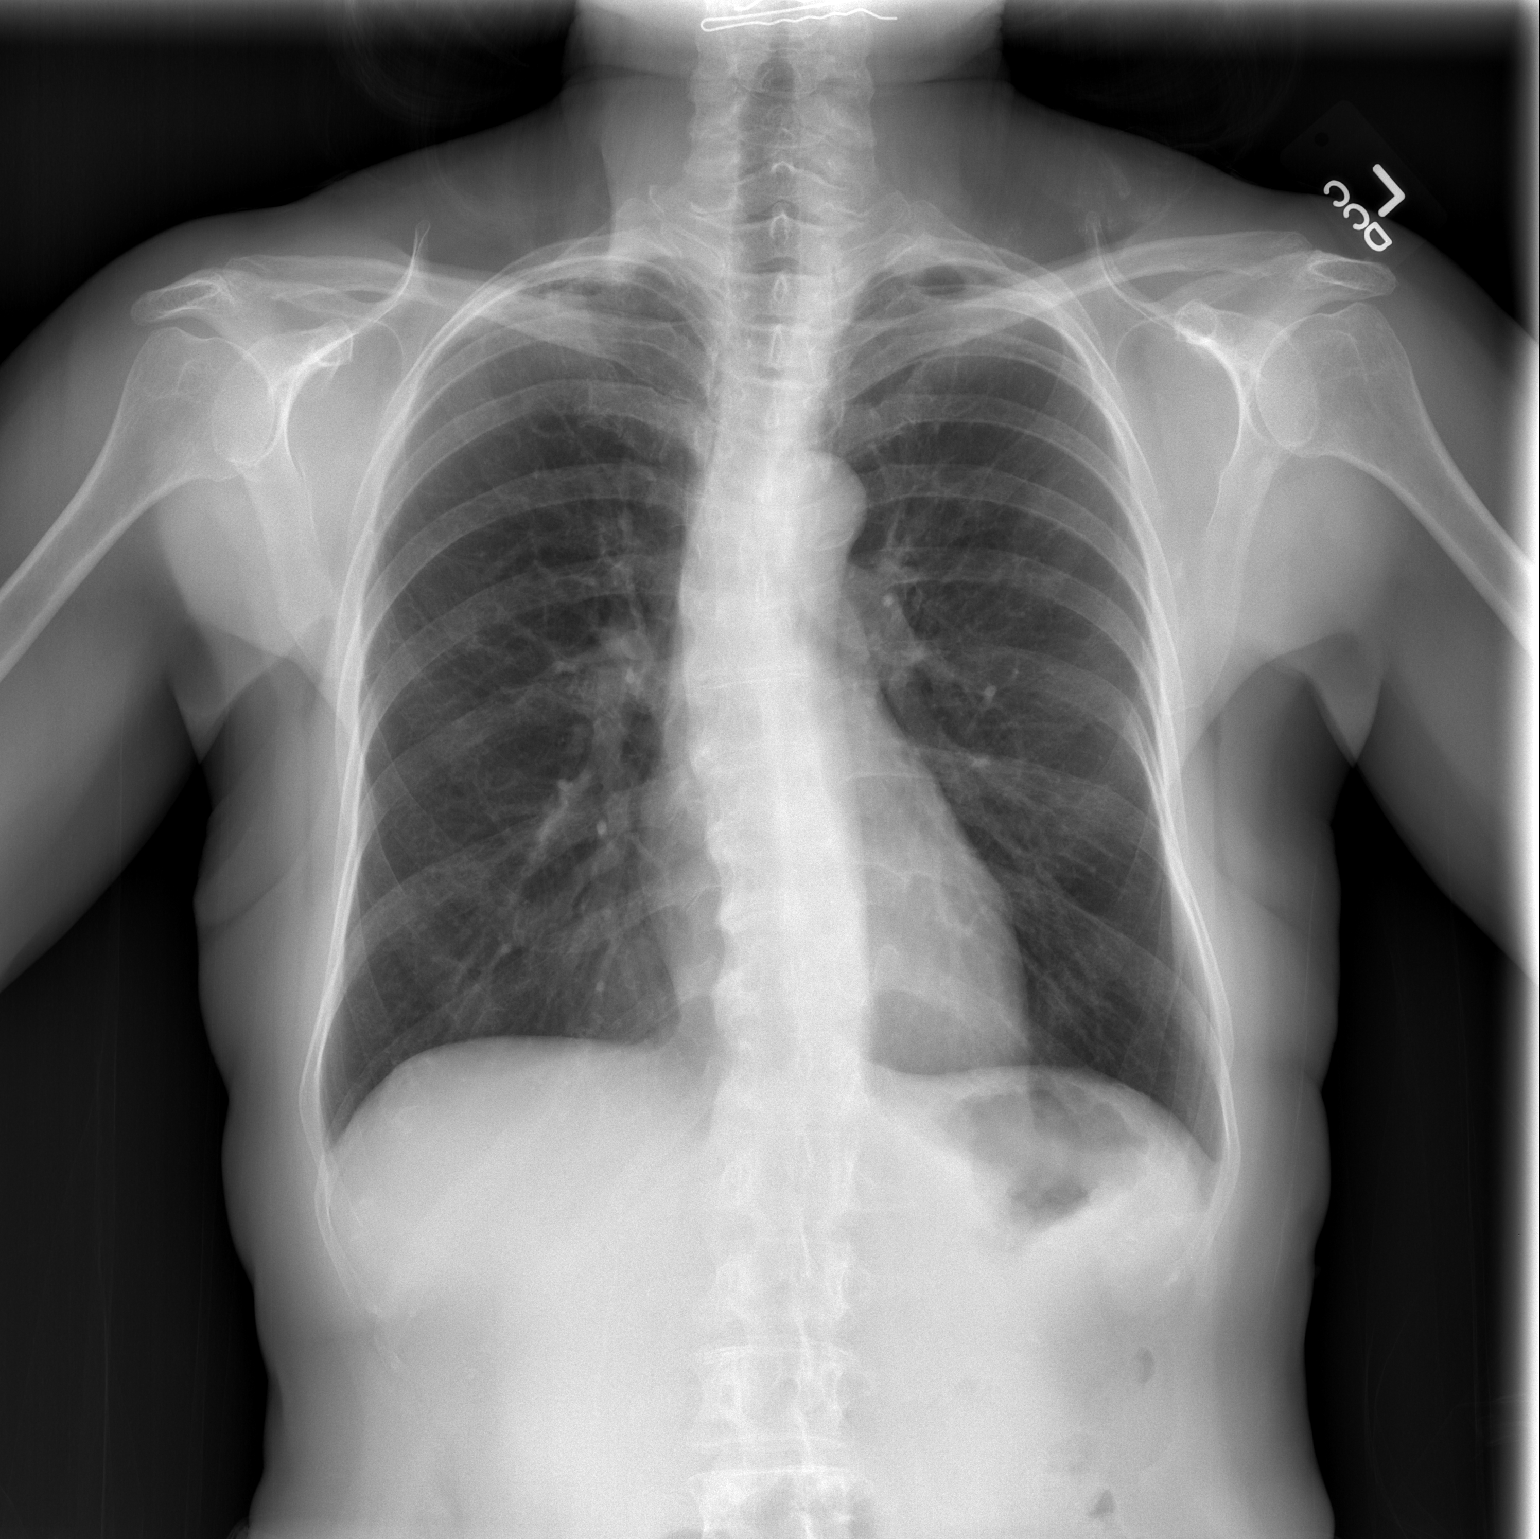

[w chest lat]
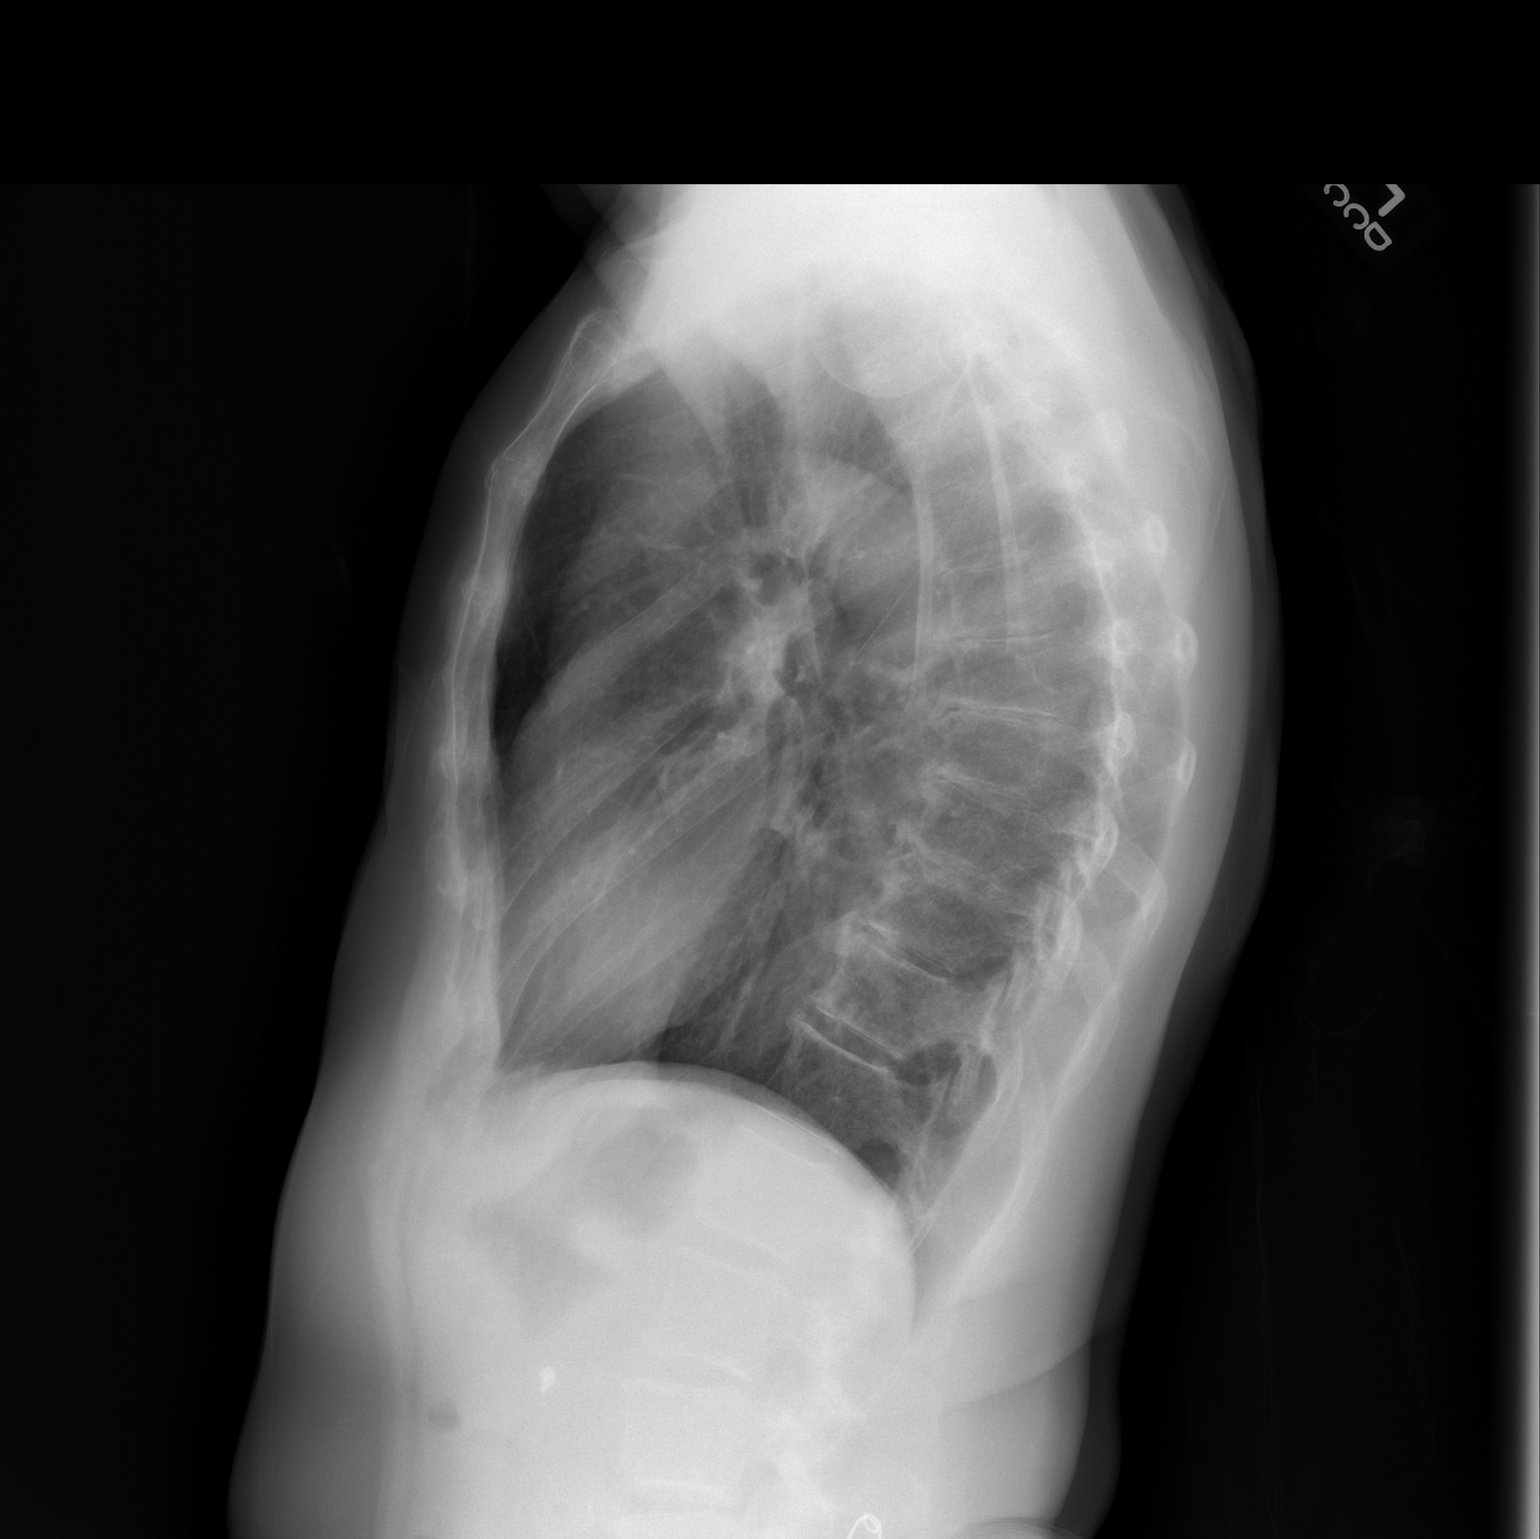

[2 of 2 positions shown; findings below may reference images not displayed]

FINDINGS: Cluster of micronodules in the right upper lobe seen on the prior CT
scan are not visible on this examination. The lungs appear clear.
Heart size is normal. No pneumothorax or pleural effusion. The
patient is status post bilateral mastectomy. Thoracic spondylosis is
noted. S shaped scoliosis scratch the convex right scoliosis is also
seen.
IMPRESSION: No acute abnormality. Micronodules seen on prior CT scan are not
visible on this exam. Note is made that follow-up chest CT was
recommended on the report of the prior CT.

## 2016-11-23 ENCOUNTER — Encounter: Payer: Self-pay | Admitting: Adult Health

## 2016-11-23 ENCOUNTER — Ambulatory Visit (HOSPITAL_BASED_OUTPATIENT_CLINIC_OR_DEPARTMENT_OTHER): Payer: Medicare Other | Admitting: Adult Health

## 2016-11-23 ENCOUNTER — Ambulatory Visit (HOSPITAL_BASED_OUTPATIENT_CLINIC_OR_DEPARTMENT_OTHER)
Admission: RE | Admit: 2016-11-23 | Discharge: 2016-11-23 | Disposition: A | Discharge status: Home / Self Care | Payer: Medicare Other | Source: Ambulatory Visit | Attending: Internal Medicine | Admitting: Internal Medicine

## 2016-11-23 ENCOUNTER — Other Ambulatory Visit (HOSPITAL_COMMUNITY): Payer: Self-pay | Admitting: *Deleted

## 2016-11-23 ENCOUNTER — Ambulatory Visit (HOSPITAL_COMMUNITY)
Admission: RE | Admit: 2016-11-23 | Discharge: 2016-11-23 | Disposition: A | Discharge status: Home / Self Care | Payer: Medicare Other | Source: Ambulatory Visit | Attending: Family Medicine | Admitting: Family Medicine

## 2016-11-23 ENCOUNTER — Encounter (HOSPITAL_COMMUNITY): Payer: Self-pay | Admitting: Internal Medicine

## 2016-11-23 ENCOUNTER — Telehealth: Payer: Self-pay | Admitting: Adult Health

## 2016-11-23 ENCOUNTER — Other Ambulatory Visit (HOSPITAL_BASED_OUTPATIENT_CLINIC_OR_DEPARTMENT_OTHER): Payer: Medicare Other

## 2016-11-23 VITALS — BP 154/70 | HR 66 | Wt 113.2 lb

## 2016-11-23 VITALS — BP 168/87 | HR 67 | Temp 98.6°F | Resp 17 | Ht 60.0 in | Wt 113.4 lb

## 2016-11-23 DIAGNOSIS — Z170 Estrogen receptor positive status [ER+]: Secondary | ICD-10-CM

## 2016-11-23 DIAGNOSIS — Z7982 Long term (current) use of aspirin: Secondary | ICD-10-CM | POA: Insufficient documentation

## 2016-11-23 DIAGNOSIS — D499 Neoplasm of unspecified behavior of unspecified site: Secondary | ICD-10-CM | POA: Diagnosis not present

## 2016-11-23 DIAGNOSIS — C773 Secondary and unspecified malignant neoplasm of axilla and upper limb lymph nodes: Secondary | ICD-10-CM

## 2016-11-23 DIAGNOSIS — L719 Rosacea, unspecified: Secondary | ICD-10-CM | POA: Diagnosis not present

## 2016-11-23 DIAGNOSIS — C50411 Malignant neoplasm of upper-outer quadrant of right female breast: Secondary | ICD-10-CM | POA: Diagnosis not present

## 2016-11-23 DIAGNOSIS — Z5181 Encounter for therapeutic drug level monitoring: Secondary | ICD-10-CM

## 2016-11-23 DIAGNOSIS — M7989 Other specified soft tissue disorders: Secondary | ICD-10-CM

## 2016-11-23 DIAGNOSIS — Z853 Personal history of malignant neoplasm of breast: Secondary | ICD-10-CM | POA: Diagnosis not present

## 2016-11-23 DIAGNOSIS — C50619 Malignant neoplasm of axillary tail of unspecified female breast: Secondary | ICD-10-CM

## 2016-11-23 DIAGNOSIS — F419 Anxiety disorder, unspecified: Secondary | ICD-10-CM | POA: Insufficient documentation

## 2016-11-23 DIAGNOSIS — C50919 Malignant neoplasm of unspecified site of unspecified female breast: Secondary | ICD-10-CM

## 2016-11-23 DIAGNOSIS — Z9221 Personal history of antineoplastic chemotherapy: Secondary | ICD-10-CM | POA: Diagnosis not present

## 2016-11-23 DIAGNOSIS — D630 Anemia in neoplastic disease: Secondary | ICD-10-CM

## 2016-11-23 DIAGNOSIS — Z9013 Acquired absence of bilateral breasts and nipples: Secondary | ICD-10-CM | POA: Diagnosis not present

## 2016-11-23 DIAGNOSIS — Z87891 Personal history of nicotine dependence: Secondary | ICD-10-CM | POA: Insufficient documentation

## 2016-11-23 DIAGNOSIS — R030 Elevated blood-pressure reading, without diagnosis of hypertension: Secondary | ICD-10-CM | POA: Diagnosis not present

## 2016-11-23 DIAGNOSIS — Z79899 Other long term (current) drug therapy: Secondary | ICD-10-CM | POA: Insufficient documentation

## 2016-11-23 DIAGNOSIS — Z923 Personal history of irradiation: Secondary | ICD-10-CM | POA: Diagnosis not present

## 2016-11-23 DIAGNOSIS — R600 Localized edema: Secondary | ICD-10-CM | POA: Diagnosis not present

## 2016-11-23 DIAGNOSIS — R609 Edema, unspecified: Secondary | ICD-10-CM | POA: Diagnosis present

## 2016-11-23 DIAGNOSIS — E2839 Other primary ovarian failure: Secondary | ICD-10-CM

## 2016-11-23 DIAGNOSIS — M8580 Other specified disorders of bone density and structure, unspecified site: Secondary | ICD-10-CM | POA: Diagnosis not present

## 2016-11-23 DIAGNOSIS — M8588 Other specified disorders of bone density and structure, other site: Secondary | ICD-10-CM

## 2016-11-23 LAB — CBC & DIFF AND RETIC
BASO%: 1 % (ref 0.0–2.0)
Basophils Absolute: 0.1 10*3/uL (ref 0.0–0.1)
EOS ABS: 0.1 10*3/uL (ref 0.0–0.5)
EOS%: 1.8 % (ref 0.0–7.0)
HCT: 37.1 % (ref 34.8–46.6)
HEMOGLOBIN: 12.4 g/dL (ref 11.6–15.9)
Immature Retic Fract: 2.3 % (ref 1.60–10.00)
LYMPH#: 1.8 10*3/uL (ref 0.9–3.3)
LYMPH%: 36.2 % (ref 14.0–49.7)
MCH: 31.3 pg (ref 25.1–34.0)
MCHC: 33.4 g/dL (ref 31.5–36.0)
MCV: 93.7 fL (ref 79.5–101.0)
MONO#: 0.4 10*3/uL (ref 0.1–0.9)
MONO%: 7.9 % (ref 0.0–14.0)
NEUT%: 53.1 % (ref 38.4–76.8)
NEUTROS ABS: 2.6 10*3/uL (ref 1.5–6.5)
Platelets: 182 10*3/uL (ref 145–400)
RBC: 3.96 10*6/uL (ref 3.70–5.45)
RDW: 12.7 % (ref 11.2–14.5)
RETIC %: 1.02 % (ref 0.70–2.10)
Retic Ct Abs: 40.39 10*3/uL (ref 33.70–90.70)
WBC: 5 10*3/uL (ref 3.9–10.3)

## 2016-11-23 LAB — COMPREHENSIVE METABOLIC PANEL
ALBUMIN: 3.7 g/dL (ref 3.5–5.0)
ALK PHOS: 62 U/L (ref 40–150)
ALT: 22 U/L (ref 0–55)
ANION GAP: 9 meq/L (ref 3–11)
AST: 25 U/L (ref 5–34)
BUN: 24.4 mg/dL (ref 7.0–26.0)
CALCIUM: 9 mg/dL (ref 8.4–10.4)
CHLORIDE: 104 meq/L (ref 98–109)
CO2: 27 mEq/L (ref 22–29)
Creatinine: 0.9 mg/dL (ref 0.6–1.1)
Glucose: 92 mg/dl (ref 70–140)
POTASSIUM: 4.2 meq/L (ref 3.5–5.1)
Sodium: 140 mEq/L (ref 136–145)
Total Bilirubin: 0.23 mg/dL (ref 0.20–1.20)
Total Protein: 6.6 g/dL (ref 6.4–8.3)

## 2016-11-23 LAB — ECHOCARDIOGRAM COMPLETE
HEIGHTINCHES: 60 in
LV SIMPSON'S DISK: 64
LV dias vol index: 44 mL/m2
LV dias vol: 65 mL (ref 46–106)
LVSYSVOL: 24 mL (ref 14–42)
LVSYSVOLIN: 16 mL/m2
Stroke v: 42 ml
WEIGHTICAEL: 1814.4 [oz_av]

## 2016-11-23 LAB — FERRITIN: FERRITIN: 30 ng/mL (ref 9–269)

## 2016-11-23 NOTE — Patient Instructions (Signed)
Please wear compression hose daily, put them on as soon as you get up in the morning and remove before going to bed at night  Restrict your sodium intake to less than 2000mg  per day. This will help prevent your body from holding onto fluid. Read food labels as a lot of canned and packaged foods have a lot of sodium.  Follow up as needed

## 2016-11-23 NOTE — Patient Instructions (Signed)

## 2016-11-23 NOTE — Progress Notes (Signed)
CLINIC:  Survivorship   REASON FOR VISIT:  Routine follow-up for history of breast cancer.   BRIEF ONCOLOGIC HISTORY:    Primary cancer of upper outer quadrant of right female breast ([place])   05/03/2011 Mammogram    Right breast: 1.8 cm irregular mass with architectural distortion at 10:00, middle depth. 2-3 indeterminate associated microcalcifications      05/04/2011 Initial Biopsy    Right breast core needle bx: invasive ductal carcinoma, grade 2, ER+ (82%), PR+ (92%), HER2/neu positive (ratio 2.86),  Ki67 12%. 1 LN postiive for metastasis      05/10/2011 Breast MRI    Right: 1.7 cm discrete mass within the UOQ, consistent with known malignancy. Enhancement extends anterior to this mass, suspicious for DCIS.Left: upper central aspect clumped nodular enhancement is suspicious for DCIS      05/19/2011 Procedure    RIGHT breast needle core bx: IDC background DCIS. HER2/neu negative (ratio 1.32).  LEFT breast needle core bx: Invasive ductal carcinoma with background DCIS, grade 1., ER+ (100%), PR+ (100%), Ki67 6%, HER2/neu negative (ratio 1.31).      05/23/2011 - 09/29/2011 Neo-Adjuvant Chemotherapy    carboplatin/ docetaxel/ trastuzumab x 6 cycles      07/26/2011 Breast MRI    Interval response to neoadjuvant chemotherapy in both breasts. There is been interval improvement in the clumped linear enhancement the UOQ the right breast as well as the nodular enhancement in the left breast.      10/05/2011 - 12/16/2011 Chemotherapy    Trastuzumab q 21 days      11/2011 Definitive Surgery    Bilateral mastectomies [facility]): RIGHT -  IDC, 1.2 cm, 3/19 LN positive for metastasis (Stage IIA ypT1c ypN1).  LEFT - IDC, 1 mm, 0/5 LN positive for metastasis (Stage IA ypT86m ypN0)       12/17/2011 - 05/2012 Chemotherapy    Adjuvant trastuzumab/ pertuzumab x3, discontinued 01/28/2012. Trastuzumab continued 02/18/2012, completed 05/2012. Final echocardiogram 06/26/2012 showed a well preserved ejection  fraction.       01/11/2012 - 02/28/2012 Radiation Therapy    Adjuvant RT completed ([facility]ablo Ledger: Right chestwall: 50.4 Gray over 28 fractions.  Right supraclavicular fossa: 45 Gy over 25 fractions.  Right drain site: 46 Gy over 23 fractions. Right scar boost: 10 Gy over 8 fractions.       03/25/2012 -  Anti-estrogen oral therapy    Tamoxifen 20 mg daily.         INTERVAL HISTORY:  [name] to the Survivorship Clinic today for routine follow-up for her history of breast cancer.  Overall, she reports feeling quite well.  She did note some vaginal bleeding and was evaluated by GYN and cleared.  She is now concerned about some leg swelling.  She says this started in 2017 and she talked to Dr,. Magrinat about it.  In April, 2018 she says he noted swelling again, worse on the left.  She does have an upcoming appointment with Dr. BHaroldine Lawstoday, and she is going to undergo an echocardiogram as well.  She did undergo a bilateral dopplers on 09/21/2016 and they were normal.  She had no dvt in either leg.  Since that checked out, Dr. MJana Hakimreferred her to Dr. BHaroldine Laws  She last saw Dr. BHaroldine Lawsin 2014 to monitor her heart due to the Trastuzumab/Herceptin she received.    Kirsten Gibson's weight is stable.  She denies any weight gain.  She denies any shortness of breath sitting, or with activity, chest pain, palpitations.  She  does take in a moderate amount of sodium.  She eats soups for lunch, and TV dinners.  She is unaware that sodium may be impacting swelling in her legs.    REVIEW OF SYSTEMS:  Review of Systems  Constitutional: Negative for appetite change, chills, diaphoresis, fatigue, fever and unexpected weight change.  HENT:   Negative for hearing loss and lump/mass.   Eyes: Negative for eye problems and icterus.  Respiratory: Negative for chest tightness, cough and shortness of breath.   Cardiovascular: Positive for leg swelling. Negative for chest pain and palpitations.    Gastrointestinal: Negative for abdominal distention, abdominal pain, blood in stool, constipation, diarrhea, nausea and vomiting.  Endocrine: Negative for hot flashes.  Musculoskeletal: Negative for arthralgias.  Skin: Negative for itching and rash.  Neurological: Negative for dizziness, extremity weakness, headaches and numbness.  Hematological: Negative for adenopathy. Does not bruise/bleed easily.  Psychiatric/Behavioral: Negative for depression. The patient is not nervous/anxious.   Breast: Denies any changes or concerns.        PAST MEDICAL/SURGICAL HISTORY:  Past Medical History:  Diagnosis Date  . Anemia due to chemotherapy 08/23/2011  . Anxiety   . Breast cancer ([place])    bilateral infiltrating ductal carcinoma  . Hx of radiation therapy 01/11/12- 02/28/12   right chest wall/supraclav fossa/drain site/scar boost  . Mycosis fungoides    follows up with [facility] once to twice per year   . Rosacea   . S/P chemotherapy, time since 4-12 weeks   . TMJ click    improved after changing the way dental exams were done  . Urinary frequency    Past Surgical History:  Procedure Laterality Date  . ABDOMINAL HYSTERECTOMY  1988   partial with on ovary remaining.  . benign tumor removed from neck  1974  . BREAST BIOPSY  05/19/11   left  and right breast  . BREAST EXCISIONAL BIOPSY  1991   benign  . CHOLECYSTECTOMY    . MASTECTOMY  11/2011   bilat, [place] Left 04/06/2012   Procedure: MINOR REMOVAL PORT-A-CATH;  Surgeon: [name], MD;  Location: [facility];  Service: General;  Laterality: Left;  . PORTACATH PLACEMENT  05/21/2011   Procedure: INSERTION PORT-A-CATH;  Surgeon: [name], MD;  Location: [facility];  Service: General;  Laterality: Left;     ALLERGIES:  Allergies  Allergen Reactions  . Latex Dermatitis and Rash  . Adhesive [Tape]     ALLERGIC TO OP-SITE......USE OP-SITE FLEXIGRID INSTEAD !!!  . Trazodone  And Nefazodone Nausea Only  . Zofran [Ondansetron Hcl] Swelling    Swelling&redness to face!!  . Diclofenac Other (See Comments)    Fever, chills  . Naproxen Other (See Comments)    Fever, chills     CURRENT MEDICATIONS:  Outpatient Encounter Prescriptions as of 11/23/2016  Medication Sig Note  . gabapentin (NEURONTIN) 300 MG capsule take 2 capsules by mouth at bedtime (Patient taking differently: take 2 capsules by mouth at bedtime:  As Needed)   . aspirin EC 81 MG tablet Take 1 tablet (81 mg total) by mouth daily.   . calcium carbonate (OS-CAL) 600 MG TABS Take 750 mg by mouth daily.    . Melatonin 3 MG CAPS Take 0.5 capsules by mouth at bedtime.   . Multiple Vitamins-Minerals (MULTIVITAMIN PO) Take 1 capsule by mouth daily.   . NON FORMULARY Take by mouth. Flax seed 12/22/2015: Received from: [facility]:  Take by mouth.  . tamoxifen (NOLVADEX) 20 MG tablet take 1 tablet by mouth once daily    No facility-administered encounter medications on file as of 11/23/2016.      ONCOLOGIC FAMILY HISTORY:  Family History  Problem Relation Age of Onset  . Cancer Maternal Aunt        breast to brain  . Cancer Paternal Grandmother        breast and/or ovarian///unknown  . Stomach cancer Paternal Grandfather        stomach  . Colon cancer Neg Hx      SOCIAL HISTORY:  unchanged   PHYSICAL EXAMINATION:  Vital Signs: Vitals:   11/23/16 0927  BP: (!) 168/87  Pulse: 67  Resp: 17  Temp: 98.6 F (37 C)  SpO2: 99%   Filed Weights   11/23/16 0927  Weight: 113 lb 6.4 oz (51.4 kg)   General: Well-nourished, well-appearing female in no acute distress.  Unaccompaniedtoday.   HEENT: Head is normocephalic.  Pupils equal and reactive to light. Conjunctivae clear without exudate.  Sclerae anicteric. Oral mucosa is pink, moist.  Oropharynx is pink without lesions or erythema.  Lymph: No cervical, supraclavicular, or infraclavicular lymphadenopathy noted on  palpation.  Cardiovascular: Regular rate and rhythm.[name] Respiratory: Clear to auscultation bilaterally. Chest expansion symmetric; breathing non-labored.  Breast Exam:  -breasts are surgically absent.  There is no nodularity, mass or skin change noted. -Axilla: No axillary adenopathy bilaterally.  GI: Abdomen soft and round; non-tender, non-distended. Bowel sounds normoactive. No hepatosplenomegaly.   GU: Deferred.  Neuro: No focal deficits. Steady gait.  Psych: Mood and affect normal and appropriate for situation.  MSK: No focal spinal tenderness to palpation, full range of motion in bilateral upper extremities Extremities: No edema. Skin: Warm and dry.  LABORATORY DATA:  None for this visit   DIAGNOSTIC IMAGING:  Most recent mammogram:  N/a s/p bilateral mastectomies CT chest 06/04/2016    ASSESSMENT AND PLAN:  Ms.. Gibson is a pleasant 71 y.o. female with history of Stage IIB right breast invasive ductal carcinoma, ER+/PR+/HER2+, diagnosed in 04/2011, treated with neoadjuvant chemotherapy, bilateral mastectomies, maintenance Trastuzumab, adjuvant radiation therapy, and anti-estrogen therapy with Tamoxifen beginning in 03/2012.  She presents to the Survivorship Clinic for surveillance and routine follow-up.   1. History of breast cancer:  Ms. Isola is currently clinically and radiographically without evidence of disease or recurrence of breast cancer. She will continue her anti-estrogen therapy with Tamoxifen, with plans to continue for 5 years.  She is concerned about whether or not she should stay on the tamoxifen past 5 years as she is coming up on her anniversary.  We briefly discussed the Breast Cancer Index test.  She will return to the cancer center to see her medical oncologist, Dr. Jana Hakim, in 05/2017 and discuss further with him.  I encouraged her to call me with any questions or concerns before her next visit at the cancer center, and I would be happy to see her sooner, if needed.    2. Lower extremity swelling: Habiba has had this swelling for quite a while.  DVT has been ruled out.  She will go see Dr. Haroldine Laws in order to r/o any cardiac cause of the swelling.  If unrelated to her heart, her swelling can likely be attributed as multifactorial due to her being on Gabapentin in addition to her elevated sodium intake.  I gave her a handout of a low sodium diet and encouraged her to be more aware of  the sodium content in foods.    3. Bone health:  Given Ms. Moye's age, history of breast cancer, she is at risk for bone demineralization. Her last DEXA scan was in 05/2014 and indicated osteopenia with a T score of -1.9.  I went ahead and ordered a repeat bone density test.  In the meantime, she was encouraged to increase her consumption of foods rich in calcium, as well as increase her weight-bearing activities.  She was given education on specific food and activities to promote bone health.  4. Cancer screening:  Due to Ms. Byrom's history and her age, she should receive screening for skin cancers, colon cancer, and gynecologic cancers. She was encouraged to follow-up with her PCP for appropriate cancer screenings.   5. Health maintenance and wellness promotion: Ms. Rivere was encouraged to consume 5-7 servings of fruits and vegetables per day. She was also encouraged to engage in moderate to vigorous exercise for 30 minutes per day most days of the week. She was instructed to limit her alcohol consumption and continue to abstain from tobacco use.    Dispo:  -Return to cancer center for follow up with Dr. Jana Hakim 05/2017 -Appointment with Dr. Haroldine Laws today   A total of (30) minutes of face-to-face time was spent with this patient with greater than 50% of that time in counseling and care-coordination.   Gardenia Phlegm, NP [facility] [place] [phone]   Note: PRIMARY CARE PROVIDER [name], [place] [phone]

## 2016-11-23 NOTE — Telephone Encounter (Signed)
Spoke with patient regarding her 10/16 los - she will cal to set up bone density. I gave [facility] number.

## 2016-11-23 NOTE — Progress Notes (Signed)
CARDIO-ONCOLOGY CLINIC CONSULT NOTE  Referring Physician: Dr. Jana Hakim Primary Care: Dr. [name]   HPI:   [name] is a 71 y.o. female with history of anxiety and stage IIB and triple positive right breast cancer (completed treatment in 2014) who is referred by Dr. Jana Hakim for evaluation of lower extremity edema.   She completed 6 cycles of neoadjuvant therapy with carboplatin/ docetaxel/ trastuzumab on 09/29/2011. S/p bilateral mastectomies at [facility] October 2013. Completed radiation to right chest wall and right supraclavicular fossa 02/2012. She had 3 cycles of pertuzumab that was discontinued on 01/28/12. She was restarted on trastuzumab 02/18/12 and completed in 05/2012.   Follow up: We have not seen her for 4.5 years since completing her Herceptin. Has been doing well. Active walking and jogging until she stopped 8 months because she became busy with various projects around her house.  Over past few months has noticed LE edema bilaterally. Usually gets worse during the day and improves overnight but doesn't completely resolve. No upper extremity edema. Had LE u/s which was normal. No SOB, orthopnea or PND.  No CP. Uses BP cuff at home and systolics typically 409W. Eats a lot of salt. No LE trauma.   Echo 2014  EF 60-65% lat s' 11.3 (stable)  Diastolic function normal.   ECHO: EF 60-65% RV normal. Normal diastolic lat s' 11.9 cm/s      Review of Systems: [y] = yes, [ ]  = no   General: Weight gain [ ] ; Weight loss [ ] ; Anorexia [ ] ; Fatigue [ ] ; Fever [ ] ; Chills [ ] ; Weakness [ ]   Cardiac: Chest pain/pressure [ ] ; Resting SOB [ ] ; Exertional SOB [ ] ; Orthopnea [ ] ; Pedal Edema [ y]; Palpitations [ ] ; Syncope [ ] ; Presyncope [ ] ; Paroxysmal nocturnal dyspnea[ ]   Pulmonary: Cough [ ] ; Wheezing[ ] ; Hemoptysis[ ] ; Sputum [ ] ; Snoring [ ]   GI: Vomiting[ ] ; Dysphagia[ ] ; Melena[ ] ; Hematochezia [ ] ; Heartburn[ ] ; Abdominal pain [ ] ; Constipation [ ] ; Diarrhea [ ] ; BRBPR  [ ]   GU: Hematuria[ ] ; Dysuria [ ] ; Nocturia[ ]   Vascular: Pain in legs with walking [ ] ; Pain in feet with lying flat [ ] ; Non-healing sores [ ] ; Stroke [ ] ; TIA [ ] ; Slurred speech [ ] ;  Neuro: Headaches[ ] ; Vertigo[ ] ; Seizures[ ] ; Paresthesias[ ] ;Blurred vision [ ] ; Diplopia [ ] ; Vision changes [ ]   Ortho/Skin: Arthritis [ ] ; Joint pain [ ] ; Muscle pain [ ] ; Joint swelling [ ] ; Back Pain [ ] ; Rash [ ]   Psych: Depression[ ] ; Anxiety[y ]  Heme: Bleeding problems [ ] ; Clotting disorders [ ] ; Anemia [ ]   Endocrine: Diabetes [ ] ; Thyroid dysfunction[ ]    Past Medical History:  Diagnosis Date  . Anemia due to chemotherapy 08/23/2011  . Anxiety   . Breast cancer ([place])    bilateral infiltrating ductal carcinoma  . Hx of radiation therapy 01/11/12- 02/28/12   right chest wall/supraclav fossa/drain site/scar boost  . Mycosis fungoides    follows up with [facility] once to twice per year   . Rosacea   . S/P chemotherapy, time since 4-12 weeks   . TMJ click    improved after changing the way dental exams were done  . Urinary frequency     Current Outpatient Prescriptions  Medication Sig Dispense Refill  . aspirin EC 81 MG tablet Take 1 tablet (81 mg total) by mouth daily.    . calcium carbonate (OS-CAL) 600 MG  TABS Take 750 mg by mouth daily.     [name] gabapentin (NEURONTIN) 300 MG capsule take 2 capsules by mouth at bedtime (Patient taking differently: take 2 capsules by mouth at bedtime:  As Needed) 180 capsule 3  . Melatonin 3 MG CAPS Take 0.5 capsules by mouth at bedtime.    . Multiple Vitamins-Minerals (MULTIVITAMIN PO) Take 1 capsule by mouth daily.    . NON FORMULARY Take by mouth. Flax seed    . tamoxifen (NOLVADEX) 20 MG tablet take 1 tablet by mouth once daily 90 tablet 3   No current facility-administered medications for this encounter.     Allergies  Allergen Reactions  . Latex Dermatitis and Rash  . Adhesive [Tape]     ALLERGIC TO OP-SITE......USE OP-SITE FLEXIGRID INSTEAD !!!  .  Trazodone And Nefazodone Nausea Only  . Zofran [Ondansetron Hcl] Swelling    Swelling&redness to face!!  . Diclofenac Other (See Comments)    Fever, chills  . Naproxen Other (See Comments)    Fever, chills      Social History   Social History  . Marital status: Married    Spouse name: N/A  . Number of children: N/A  . Years of education: N/A   Occupational History  . Not on file.   Social History Main Topics  . Smoking status: Former Smoker    Packs/day: 1.00    Years: 20.00    Types: Cigarettes    Quit date: 02/08/1986  . Smokeless tobacco: Never Used  . Alcohol use No  . Drug use: No  . Sexual activity: Yes    Birth control/ protection: Post-menopausal   Other Topics Concern  . Not on file   Social History Narrative  . No narrative on file      Family History  Problem Relation Age of Onset  . Cancer Maternal Aunt        breast to brain  . Cancer Paternal Grandmother        breast and/or ovarian///unknown  . Stomach cancer Paternal Grandfather        stomach  . Colon cancer Neg Hx     Vitals:   11/23/16 1404  BP: (!) 154/70  Pulse: 66  SpO2: 100%  Weight: 113 lb 4 oz (51.4 kg)    PHYSICAL EXAM: General:  Well appearing. Mildly anxious appearing No respiratory difficulty HEENT: normal Neck: supple. no JVD. Carotids 2+ bilat; no bruits. No lymphadenopathy or thryomegaly appreciated. Cor: PMI nondisplaced. Regular rate & rhythm. No rubs, gallops or murmurs. Lungs: clear Abdomen: soft, nontender, nondistended. No hepatosplenomegaly. No bruits or masses. Good bowel sounds. Extremities: no cyanosis, clubbing, rash, trace-1+ LE edema Neuro: alert & oriented x 3, cranial nerves grossly intact. moves all 4 extremities w/o difficulty. Affect pleasant.    ASSESSMENT & PLAN: 1. LE edema - This is mild. I have reviewed echo and LE u/s personally. She has normal systolic and diastolic function. Suspect edema related to venous insufficiency. I have asked her  to limit salt intake, keep legs elevated and wear compression stockings. Can use diuretics as needed. Resume exercise program.   2. HTN - This is white-coat HTN. BP well controlled at home and even low in am. No treatment needed at this point.    Glori Bickers, MD  2:30 PM

## 2016-11-23 NOTE — Progress Notes (Signed)
  Echocardiogram 2D Echocardiogram has been performed.  Machine shut down. Unable to assess LV function using strain.  Melton Walls L Androw 11/23/2016, 2:01 PM

## 2016-11-24 LAB — LIPID PANEL
CHOLESTEROL TOTAL: 165 mg/dL (ref 100–199)
Chol/HDL Ratio: 2 ratio (ref 0.0–4.4)
HDL: 81 mg/dL (ref >39)
LDL CALC: 72 mg/dL (ref 0–99)
Triglycerides: 62 mg/dL (ref 0–149)
VLDL Cholesterol Cal: 12 mg/dL (ref 5–40)

## 2016-11-26 NOTE — Telephone Encounter (Signed)
No entry 

## 2017-01-05 DIAGNOSIS — L719 Rosacea, unspecified: Secondary | ICD-10-CM | POA: Diagnosis not present

## 2017-01-05 DIAGNOSIS — L814 Other melanin hyperpigmentation: Secondary | ICD-10-CM | POA: Diagnosis not present

## 2017-01-05 DIAGNOSIS — D2239 Melanocytic nevi of other parts of face: Secondary | ICD-10-CM | POA: Diagnosis not present

## 2017-01-05 DIAGNOSIS — D1801 Hemangioma of skin and subcutaneous tissue: Secondary | ICD-10-CM | POA: Diagnosis not present

## 2017-01-05 DIAGNOSIS — L309 Dermatitis, unspecified: Secondary | ICD-10-CM | POA: Diagnosis not present

## 2017-01-05 DIAGNOSIS — D225 Melanocytic nevi of trunk: Secondary | ICD-10-CM | POA: Diagnosis not present

## 2017-01-05 DIAGNOSIS — Z808 Family history of malignant neoplasm of other organs or systems: Secondary | ICD-10-CM | POA: Diagnosis not present

## 2017-01-05 DIAGNOSIS — L84 Corns and callosities: Secondary | ICD-10-CM | POA: Diagnosis not present

## 2017-01-05 DIAGNOSIS — L821 Other seborrheic keratosis: Secondary | ICD-10-CM | POA: Diagnosis not present

## 2017-01-05 DIAGNOSIS — Z23 Encounter for immunization: Secondary | ICD-10-CM | POA: Diagnosis not present

## 2017-01-05 DIAGNOSIS — I872 Venous insufficiency (chronic) (peripheral): Secondary | ICD-10-CM | POA: Diagnosis not present

## 2017-01-05 DIAGNOSIS — L720 Epidermal cyst: Secondary | ICD-10-CM | POA: Diagnosis not present

## 2017-01-31 ENCOUNTER — Other Ambulatory Visit: Payer: Self-pay | Admitting: *Deleted

## 2017-01-31 DIAGNOSIS — C50411 Malignant neoplasm of upper-outer quadrant of right female breast: Secondary | ICD-10-CM

## 2017-01-31 DIAGNOSIS — F419 Anxiety disorder, unspecified: Secondary | ICD-10-CM

## 2017-01-31 MED ORDER — ZOLPIDEM TARTRATE 5 MG PO TABS: ORAL_TABLET | ORAL | 1 refills | Status: DC

## 2017-03-14 DIAGNOSIS — H9312 Tinnitus, left ear: Secondary | ICD-10-CM | POA: Diagnosis not present

## 2017-03-14 DIAGNOSIS — H903 Sensorineural hearing loss, bilateral: Secondary | ICD-10-CM | POA: Diagnosis not present

## 2017-03-17 ENCOUNTER — Other Ambulatory Visit: Payer: Self-pay

## 2017-03-17 DIAGNOSIS — Z9221 Personal history of antineoplastic chemotherapy: Secondary | ICD-10-CM

## 2017-03-17 DIAGNOSIS — C50411 Malignant neoplasm of upper-outer quadrant of right female breast: Secondary | ICD-10-CM

## 2017-03-17 DIAGNOSIS — R5383 Other fatigue: Secondary | ICD-10-CM

## 2017-04-13 ENCOUNTER — Other Ambulatory Visit: Payer: Self-pay | Admitting: Oncology

## 2017-04-13 DIAGNOSIS — C50411 Malignant neoplasm of upper-outer quadrant of right female breast: Secondary | ICD-10-CM

## 2017-04-13 DIAGNOSIS — F419 Anxiety disorder, unspecified: Secondary | ICD-10-CM

## 2017-04-20 DIAGNOSIS — H2513 Age-related nuclear cataract, bilateral: Secondary | ICD-10-CM | POA: Diagnosis not present

## 2017-04-20 DIAGNOSIS — D3132 Benign neoplasm of left choroid: Secondary | ICD-10-CM | POA: Diagnosis not present

## 2017-04-20 DIAGNOSIS — H04123 Dry eye syndrome of bilateral lacrimal glands: Secondary | ICD-10-CM | POA: Diagnosis not present

## 2017-04-20 DIAGNOSIS — H25013 Cortical age-related cataract, bilateral: Secondary | ICD-10-CM | POA: Diagnosis not present

## 2017-04-27 DIAGNOSIS — Z1211 Encounter for screening for malignant neoplasm of colon: Secondary | ICD-10-CM | POA: Diagnosis not present

## 2017-04-27 DIAGNOSIS — Z0000 Encounter for general adult medical examination without abnormal findings: Secondary | ICD-10-CM | POA: Diagnosis not present

## 2017-05-09 ENCOUNTER — Telehealth: Payer: Self-pay

## 2017-05-09 DIAGNOSIS — Z923 Personal history of irradiation: Secondary | ICD-10-CM

## 2017-05-09 DIAGNOSIS — C50011 Malignant neoplasm of nipple and areola, right female breast: Secondary | ICD-10-CM

## 2017-05-09 DIAGNOSIS — D630 Anemia in neoplastic disease: Principal | ICD-10-CM

## 2017-05-09 DIAGNOSIS — C50411 Malignant neoplasm of upper-outer quadrant of right female breast: Secondary | ICD-10-CM

## 2017-05-09 DIAGNOSIS — F419 Anxiety disorder, unspecified: Secondary | ICD-10-CM

## 2017-05-09 DIAGNOSIS — C50919 Malignant neoplasm of unspecified site of unspecified female breast: Secondary | ICD-10-CM

## 2017-05-09 DIAGNOSIS — D499 Neoplasm of unspecified behavior of unspecified site: Secondary | ICD-10-CM

## 2017-05-09 NOTE — Telephone Encounter (Signed)
Received VM from pt in regards to she would like blood work checked for her thyroid next appt which is 05/24/2017, she reports she feels the area in neck feels odd to touch and thinks her voice may be affected.  Notified Dr Jana Hakim and he would like to order a thyroid panel for next visit.  Called pt and let her know it will be ordered.  She will contact us if symptoms worsen or feels she needs to be seen sooner than 4-16.  No other needs at this time.

## 2017-05-20 ENCOUNTER — Ambulatory Visit
Admission: RE | Admit: 2017-05-20 | Discharge: 2017-05-20 | Disposition: A | Discharge status: Home / Self Care | Payer: Medicare Other | Source: Ambulatory Visit | Attending: Adult Health | Admitting: Adult Health

## 2017-05-20 DIAGNOSIS — E2839 Other primary ovarian failure: Secondary | ICD-10-CM

## 2017-05-20 DIAGNOSIS — M8588 Other specified disorders of bone density and structure, other site: Secondary | ICD-10-CM

## 2017-05-20 DIAGNOSIS — Z780 Asymptomatic menopausal state: Secondary | ICD-10-CM | POA: Diagnosis not present

## 2017-05-20 DIAGNOSIS — M8589 Other specified disorders of bone density and structure, multiple sites: Secondary | ICD-10-CM | POA: Diagnosis not present

## 2017-05-23 NOTE — Progress Notes (Signed)
ID: Kirsten Gibson   DOB: 1946-02-05  MR#: [id number]  PRF#:[id number]  PCP: [name], MD GYN:  SU: [name], [name] OTHER DJ:TTSVX [name]   HISTORY OF PRESENT ILLNESS: From the original intake note:  She palpated a right breast mass March 2013 and presented for a mammogram. This confirmed the presence of a 1.9 cm mass in the upper outer quadrants. And adjacent mass was noted which was possibly thought to represent a intramammary lymph node. A right axillary lymph node was also noted to be enlarged. Biopsy of the upper outer quadrant mass showed a grade 2 invasive ductal carcinoma which was ER/PR positive HER-2 positive. Ki 67 was 12%. An MRI of the breast was performed 05/10/2011 which showed an overall area of the right breast measuring 5.0 x 2.6 x 2.2 cm of clumped linear enhancement. Majority of this was felt to represent DCIS. A small area of clumped enhancement was noted in the left breast which was not seen on the mammogram. A biopsy of this area was recommended. A biopsy of the right axillary lymph node was also positive for invasive ductal carcinoma.   The patient's subsequent history is as detailed below  INTERVAL HISTORY: Kirsten Gibson returns today for follow-up of her estrogen receptor positive breast cancer accompanied by her husband. She continues on tamoxifen, with good tolerance. She has some hot flashes but they are not intense. She had some increased vaginal discharge in the past, but she denies current issues with this. She has cataracts and she plans to have them removed by the end of the year under Dr. [name]. She also takes Neurontin at bedtime, with no complications.   She completed a bone density scan on 05/20/2017 with a T-score of -2.2 osteopenia.   REVIEW OF SYSTEMS: Avilyn reports that she has been remodeling her basement. She has been busy with additional house tasks. She has not been exercising lately because of  back issues. She has gone to physical therapy. She also says that her blood pressure has increased. She is concerned about the long-term benefits of tamoxifen. She denies unusual headaches, visual changes, nausea, vomiting, or dizziness. There has been no unusual cough, phlegm production, or pleurisy. This been no change in bowel or bladder habits. She denies unexplained fatigue or unexplained weight loss, bleeding, rash, or fever. A detailed review of systems was otherwise stable.    PAST MEDICAL HISTORY: Past Medical History:  Diagnosis Date  . Anemia due to chemotherapy 08/23/2011  . Anxiety   . Breast cancer ([place])    bilateral infiltrating ductal carcinoma  . Hx of radiation therapy 01/11/12- 02/28/12   right chest wall/supraclav fossa/drain site/scar boost  . Mycosis fungoides    follows up with [facility] once to twice per year   . Rosacea   . S/P chemotherapy, time since 4-12 weeks   . TMJ click    improved after changing the way dental exams were done  . Urinary frequency     PAST SURGICAL HISTORY: Past Surgical History:  Procedure Laterality Date  . ABDOMINAL HYSTERECTOMY  1988   partial with on ovary remaining.  . benign tumor removed from neck  1974  . BREAST BIOPSY  05/19/11   left  and right breast  . BREAST EXCISIONAL BIOPSY  1991   benign  . CHOLECYSTECTOMY    . MASTECTOMY  11/2011   bilat, [place] Left 04/06/2012   Procedure: MINOR REMOVAL  PORT-A-CATH;  Surgeon: [name], MD;  Location: [facility];  Service: General;  Laterality: Left;  . PORTACATH PLACEMENT  05/21/2011   Procedure: INSERTION PORT-A-CATH;  Surgeon: [name], MD;  Location: [facility];  Service: General;  Laterality: Left;    FAMILY HISTORY Family History  Problem Relation Age of Onset  . Cancer Maternal Aunt        breast to brain  . Cancer Paternal Grandmother        breast and/or ovarian///unknown  . Stomach cancer Paternal  Grandfather        stomach  . Colon cancer Neg Hx    the patient's fatherDied at age 32 in January [id number] from complications of diabetes. The patient had no brothers, 3 sisters. One of the patient's mother is 2 sisters was diagnosed with breast cancer in her late 81s. There is no other history of breast or ovarian cancer in the family. The patient tells me she was evaluated for genetic testing at [facility], but that the insurance would not approve of the test.  GYNECOLOGIC HISTORY: Menarche age 22, she is GX P0. She underwent hysterectomy and unilateral salpingo-oophorectomy in 1988. She took hormone replacement for approximately 3 years.  SOCIAL HISTORY: She used to work at [employer], but is now retired. Her husband, Kirsten Gibson, used to work for the [employer]. He has a son from a prior marriage, and 2 grandchildren. The patient attends [employer].   ADVANCED DIRECTIVES:  patient's husband is her healthcare power of attorney  HEALTH MAINTENANCE: Social History   Tobacco Use  . Smoking status: Former Smoker    Packs/day: 1.00    Years: 20.00    Pack years: 20.00    Types: Cigarettes    Last attempt to quit: 02/08/1986    Years since quitting: 31.3  . Smokeless tobacco: Never Used  Substance Use Topics  . Alcohol use: No    Alcohol/week: 0.0 oz  . Drug use: No     Colonoscopy:  April 2014, [facility]  PAP:  Bone density: 05/20/2017 T-score of -2.2 osteopenia  Lipid panel:  Allergies  Allergen Reactions  . Latex Dermatitis and Rash  . Adhesive [Tape]     ALLERGIC TO OP-SITE......USE OP-SITE FLEXIGRID INSTEAD !!!  . Trazodone And Nefazodone Nausea Only  . Zofran [Ondansetron Hcl] Swelling    Swelling&redness to face!!  . Diclofenac Other (See Comments)    Fever, chills  . Naproxen Other (See Comments)    Fever, chills    Current Outpatient Medications  Medication Sig Dispense Refill  . aspirin EC 81 MG tablet  Take 1 tablet (81 mg total) by mouth daily.    . calcium carbonate (OS-CAL) 600 MG TABS Take 750 mg by mouth daily.     [name] gabapentin (NEURONTIN) 300 MG capsule take 2 capsules by mouth at bedtime (Patient taking differently: take 2 capsules by mouth at bedtime:  As Needed) 180 capsule 3  . Melatonin 3 MG CAPS Take 0.5 capsules by mouth at bedtime.    . Multiple Vitamins-Minerals (MULTIVITAMIN PO) Take 1 capsule by mouth daily.    . NON FORMULARY Take by mouth. Flax seed    . tamoxifen (NOLVADEX) 20 MG tablet take 1 tablet by mouth once daily 90 tablet 3  . zolpidem (AMBIEN) 5 MG tablet TAKE 1 TABLET BY MOUTH EVERY NIGHT AT BEDTIME FOR SLEEP 30 tablet 0   No current facility-administered medications for this visit.  OBJECTIVE: Middle-aged white woman who appears well  Vitals:   05/24/17 0928  BP: (!) 146/84  Pulse: 78  Resp: 18  Temp: 97.6 F (36.4 C)  SpO2: 99%     Body mass index is 21.19 kg/m.    ECOG FS: 0 Filed Weights   05/24/17 0928  Weight: 108 lb 8 oz (49.2 kg)   Sclerae unicteric, EOMs intact Oropharynx clear and moist No cervical or supraclavicular adenopathy Lungs no rales or rhonchi Heart regular rate and rhythm Abd soft, nontender, positive bowel sounds MSK no focal spinal tenderness, no upper extremity lymphedema Neuro: nonfocal, well oriented, appropriate affect Breasts: Status post bilateral mastectomies.  There is no evidence of chest wall recurrence.   LAB RESULTS: Lab Results  Component Value Date   WBC 5.1 05/24/2017   NEUTROABS 3.0 05/24/2017   HGB 12.4 11/23/2016   HCT 39.1 05/24/2017   MCV 93.6 05/24/2017   PLT 200 05/24/2017      Chemistry      Component Value Date/Time   NA 140 11/23/2016 0858   K 4.2 11/23/2016 0858   CL 104 06/28/2012 0950   CO2 27 11/23/2016 0858   BUN 24.4 11/23/2016 0858   CREATININE 0.9 11/23/2016 0858      Component Value Date/Time   CALCIUM 9.0 11/23/2016 0858   ALKPHOS 62 11/23/2016 0858   AST 25  11/23/2016 0858   ALT 22 11/23/2016 0858   BILITOT 0.23 11/23/2016 0858      Lab Results  Component Value Date   LABCA2 21 02/18/2012     STUDIES: Most recent bone density results reviewed with the patient  ASSESSMENT: 72 y.o. [place] woman  (1) status post right breast and axillary lymph node biopsy 05/04/2011, both positive for a grade 2 invasive ductal carcinoma, cT2 pN1 or stage IIB,estrogen receptor 82% and progesterone receptor 92% positive, with an MIB-1 of 12%, and HER-2 amplification by CISH with a ratio of 2.86  (2) biopsy 05/19/2011 of a second right breast area showed low-grade invasive ductal carcinoma, estrogen and progesterone receptor both 100% positive, with an MIB-1 of 13%, and no HER-2 amplification  (3) left breast biopsy 05/19/2011 showed a low-grade invasive ductal carcinoma, e-cadherin positive, HER-2 negative  (4) neoadjuvant treatment consisted of carboplatin/ docetaxel/ trastuzumab x6, started 06/02/2011 and completed 09/29/2011  (5) status post bilateral mastectomies at [facility] October 2013 showing  (a) on the right, a residual 1.2 cm invasive ductal carcinoma in the breast, with 3 of 19 lymph nodes involved; an additional lymph node showed only isolated tumor cells, so this is a ypT1c ypN1 result  (b) on the left, there was a 1 mm area of residual invasive ductal carcinoma, with all 5 sentinel lymph nodes negative  (6) the patient completed postmastectomy radiation to the right chest wall and right supraclavicular fossa 02/28/2012  (7) status post-op trastuzumab/ pertuzumab x3, discontinued 01/28/2012  (8) trastuzumab continued 02/18/2012, completed April 2014. Final echocardiogram 06/26/2012 showed a well preserved ejection fraction.  (9) tamoxifen started 03/25/2012  (10) osteopenia, with bone density 05/23/2014 showing a T score of -1.9  (a) bone density scan on 05/20/2017 with a T-score of -2.2 osteopenia.   (11) history of greater than  20 pack year smoking, low-dose screening chest CT SEPT 2015 negative  (a) CT chest 06/04/2016 shows no evidence of malignancy.  (12) blood pressure and blood draws preferentially left arm  PLAN:  I spent approximately 30 minutes with Opal Sidles with most of that time spent discussing  her complex problems.  She had many questions including why there was a diagnosis of anxiety and anemia in her list of problems since she does not have those problems.  We went over her diagnosis list and I try to eliminate all the ones that were not current.  She continues to be very concerned regarding the possibility of lymphedema.  She is having her blood pressure checked in the legs and even that is a little bit of a discomfort for her.  The big question however is whether to continue tamoxifen or not.  She does have cataracts and she understands this will worsen quickly with tamoxifen.  She understands the risk of blood clots with tamoxifen.  She does not have significant issues with hot flashes or vaginal discharge.  Of course the medication is very inexpensive.  On the plus side she does have osteopenia which is worsening, and tamoxifen will help with that.  It will also further lower her risk of recurrence.  After much discussion she decided that she was willing to take tamoxifen for a total of 10 years and that is what we are doing.  We discussed survivorship.  She was not satisfied with the survivorship visit she had some time ago she would like me to see her instead and I am willing to do that for her  We discussed her ferritin.  She understand her ferritin is in the normal range and she has no anemia right now and her MCV is normal  I am delighted that she shows no evidence of disease recurrence.  She will see me again in 6 months.  She knows to call for any other issues that may develop before then.   Amsi Grimley, Virgie Dad, MD  05/24/17 9:52 AM Medical Oncology and Hematology [facility] [address] [place], [place] [address] Tel. [phone]    Fax. [phone]  This document serves as a record of services personally performed by Lurline Del, MD. It was created on his behalf by Sheron Nightingale, a trained medical scribe. The creation of this record is based on the scribe's personal observations and the provider's statements to them.   I have reviewed the above documentation for accuracy and completeness, and I agree with the above.

## 2017-05-24 ENCOUNTER — Telehealth: Payer: Self-pay | Admitting: Oncology

## 2017-05-24 ENCOUNTER — Inpatient Hospital Stay: Payer: Medicare Other | Attending: Oncology | Admitting: Oncology

## 2017-05-24 ENCOUNTER — Inpatient Hospital Stay: Payer: Medicare Other

## 2017-05-24 VITALS — BP 146/84 | HR 78 | Temp 97.6°F | Resp 18 | Ht 60.0 in | Wt 108.5 lb

## 2017-05-24 DIAGNOSIS — C50919 Malignant neoplasm of unspecified site of unspecified female breast: Secondary | ICD-10-CM

## 2017-05-24 DIAGNOSIS — R5383 Other fatigue: Secondary | ICD-10-CM

## 2017-05-24 DIAGNOSIS — Z79810 Long term (current) use of selective estrogen receptor modulators (SERMs): Secondary | ICD-10-CM

## 2017-05-24 DIAGNOSIS — F419 Anxiety disorder, unspecified: Secondary | ICD-10-CM

## 2017-05-24 DIAGNOSIS — M8580 Other specified disorders of bone density and structure, unspecified site: Secondary | ICD-10-CM | POA: Diagnosis not present

## 2017-05-24 DIAGNOSIS — Z170 Estrogen receptor positive status [ER+]: Secondary | ICD-10-CM | POA: Diagnosis not present

## 2017-05-24 DIAGNOSIS — C50411 Malignant neoplasm of upper-outer quadrant of right female breast: Secondary | ICD-10-CM

## 2017-05-24 DIAGNOSIS — D630 Anemia in neoplastic disease: Secondary | ICD-10-CM

## 2017-05-24 DIAGNOSIS — C50011 Malignant neoplasm of nipple and areola, right female breast: Secondary | ICD-10-CM

## 2017-05-24 LAB — COMPREHENSIVE METABOLIC PANEL
ALBUMIN: 3.9 g/dL (ref 3.5–5.0)
ALK PHOS: 66 U/L (ref 40–150)
ALT: 25 U/L (ref 0–55)
ANION GAP: 9 (ref 3–11)
AST: 30 U/L (ref 5–34)
BILIRUBIN TOTAL: 0.3 mg/dL (ref 0.2–1.2)
BUN: 24 mg/dL (ref 7–26)
CO2: 27 mmol/L (ref 22–29)
Calcium: 9.5 mg/dL (ref 8.4–10.4)
Chloride: 101 mmol/L (ref 98–109)
Creatinine, Ser: 0.78 mg/dL (ref 0.60–1.10)
GFR calc Af Amer: >60 mL/min (ref >60)
GFR calc non Af Amer: >60 mL/min (ref >60)
GLUCOSE: 82 mg/dL (ref 70–140)
POTASSIUM: 3.8 mmol/L (ref 3.5–5.1)
SODIUM: 137 mmol/L (ref 136–145)
Total Protein: 7 g/dL (ref 6.4–8.3)

## 2017-05-24 LAB — CBC WITH DIFFERENTIAL (CANCER CENTER ONLY)
Basophils Absolute: 0.1 10*3/uL (ref 0.0–0.1)
Basophils Relative: 1 %
EOS PCT: 2 %
Eosinophils Absolute: 0.1 10*3/uL (ref 0.0–0.5)
HCT: 39.1 % (ref 34.8–46.6)
Hemoglobin: 13.2 g/dL (ref 11.6–15.9)
LYMPHS PCT: 30 %
Lymphs Abs: 1.5 10*3/uL (ref 0.9–3.3)
MCH: 31.6 pg (ref 25.1–34.0)
MCHC: 33.8 g/dL (ref 31.5–36.0)
MCV: 93.6 fL (ref 79.5–101.0)
MONO ABS: 0.5 10*3/uL (ref 0.1–0.9)
Monocytes Relative: 9 %
NEUTROS ABS: 3 10*3/uL (ref 1.5–6.5)
Neutrophils Relative %: 58 %
Platelet Count: 200 10*3/uL (ref 145–400)
RBC: 4.18 MIL/uL (ref 3.70–5.45)
RDW: 12.9 % (ref 11.2–14.5)
WBC Count: 5.1 10*3/uL (ref 3.9–10.3)

## 2017-05-24 LAB — TSH: TSH: 1.806 u[IU]/mL (ref 0.308–3.960)

## 2017-05-24 LAB — T4, FREE: Free T4: 0.92 ng/dL (ref 0.61–1.12)

## 2017-05-24 LAB — FERRITIN: FERRITIN: 39 ng/mL (ref 9–269)

## 2017-05-24 MED ORDER — ZOLPIDEM TARTRATE 5 MG PO TABS: ORAL_TABLET | ORAL | 0 refills | Status: DC

## 2017-05-24 MED ORDER — TAMOXIFEN CITRATE 20 MG PO TABS: 20.0000 mg | ORAL_TABLET | Freq: Every day | ORAL | 3 refills | Status: DC

## 2017-05-24 MED ORDER — GABAPENTIN 300 MG PO CAPS: 600.0000 mg | ORAL_CAPSULE | Freq: Every day | ORAL | 3 refills | Status: DC

## 2017-05-24 NOTE — Telephone Encounter (Signed)
Gave patient AVs and calendar of upcoming October appointments.  °

## 2017-11-01 ENCOUNTER — Other Ambulatory Visit: Payer: Self-pay | Admitting: Internal Medicine

## 2017-11-01 ENCOUNTER — Other Ambulatory Visit: Payer: Medicare Other

## 2017-11-01 DIAGNOSIS — Z201 Contact with and (suspected) exposure to tuberculosis: Secondary | ICD-10-CM | POA: Diagnosis not present

## 2017-11-04 LAB — QUANTIFERON-TB GOLD PLUS
Mitogen-NIL: >10 IU/mL
NIL: 0.02 IU/mL
QUANTIFERON-TB GOLD PLUS: NEGATIVE
TB1-NIL: 0.01 IU/mL
TB2-NIL: 0 [IU]/mL

## 2017-11-07 NOTE — Progress Notes (Signed)
ID: Kirsten Gibson   DOB: 10/19/1945  MR#: [id number]  Kirsten Gibson#:[id number]  PCP: Kirsten Jordan, MD GYN:  SU: Kirsten Gibson, Kirsten Gibson OTHER Kirsten Gibson   HISTORY OF PRESENT ILLNESS: From the original intake note:  She palpated a right breast mass March 2013 and presented for a mammogram. This confirmed the presence of a 1.9 cm mass in the upper outer quadrants. And adjacent mass was noted which was possibly thought to represent a intramammary lymph node. A right axillary lymph node was also noted to be enlarged. Biopsy of the upper outer quadrant mass showed a grade 2 invasive ductal carcinoma which was ER/PR positive HER-2 positive. Ki 67 was 12%. An MRI of the breast was performed 05/10/2011 which showed an overall area of the right breast measuring 5.0 x 2.6 x 2.2 cm of clumped linear enhancement. Majority of this was felt to represent DCIS. A small area of clumped enhancement was noted in the left breast which was not seen on the mammogram. A biopsy of this area was recommended. A biopsy of the right axillary lymph node was also positive for invasive ductal carcinoma.   The patient's subsequent history is as detailed below  INTERVAL HISTORY: Kirsten Gibson today for follow-up of her estrogen receptor positive breast cancer. She continues on tamoxifen, with good tolerance. She tries to take it every morning after breakfast. She has been feeling nauseous over the last year. She started taking it at least 30 minutes before breakfast, which has improved her nausea. She denies issues with hot flashes or increased vaginal discharge.   REVIEW OF SYSTEMS: Kirsten Gibson reports that she feels good. She has not had enough time to exercise like she would like to. She was helping take care of a friend with some medical issues. She denies unusual headaches, visual changes, nausea, vomiting, or dizziness. There has been no unusual cough, phlegm production, or  pleurisy. There has been no change in bowel or bladder habits. She denies unexplained fatigue or unexplained weight loss, bleeding, rash, or fever. A detailed review of systems was otherwise stable.    PAST MEDICAL HISTORY: Past Medical History:  Diagnosis Date  . Anemia due to chemotherapy 08/23/2011  . Anxiety   . Breast cancer ([place])    bilateral infiltrating ductal carcinoma  . Hx of radiation therapy 01/11/12- 02/28/12   right chest wall/supraclav fossa/drain site/scar boost  . Mycosis fungoides    follows up with Kirsten Gibson once to twice per year   . Rosacea   . S/P chemotherapy, time since 4-12 weeks   . TMJ click    improved after changing the way dental exams were done  . Urinary frequency     PAST SURGICAL HISTORY: Past Surgical History:  Procedure Laterality Date  . ABDOMINAL HYSTERECTOMY  1988   partial with on ovary remaining.  . benign tumor removed from neck  1974  . BREAST BIOPSY  05/19/11   left  and right breast  . BREAST EXCISIONAL BIOPSY  1991   benign  . CHOLECYSTECTOMY    . MASTECTOMY  11/2011   bilat, [place] Left 04/06/2012   Procedure: MINOR REMOVAL PORT-A-CATH;  Surgeon: Kirsten Lasso, MD;  Location: [facility];  Service: General;  Laterality: Left;  . PORTACATH PLACEMENT  05/21/2011   Procedure: INSERTION PORT-A-CATH;  Surgeon: Kirsten Lasso, MD;  [facility];  Service: General;  Laterality: Left;    FAMILY  HISTORY Family History  Problem Relation Age of Onset  . Cancer Maternal Aunt        breast to brain  . Cancer Paternal Grandmother        breast and/or ovarian///unknown  . Stomach cancer Paternal Grandfather        stomach  . Colon cancer Neg Hx    the patient's father died at age 46 in January 4431 from complications of diabetes. The patient had no brothers, 3 sisters. One of the patient's mother's 2 sisters was diagnosed with breast cancer in her late 62s. There is no other history  of breast or ovarian cancer in the family. The patient tells me she was evaluated for genetic testing at [facility], but that the insurance would not approve of the test.  GYNECOLOGIC HISTORY: Menarche age 22, she is GX P0. She underwent hysterectomy and unilateral salpingo-oophorectomy in 1988. She took hormone replacement for approximately 3 years.  SOCIAL HISTORY: She used to work at [employer], but is now retired. Her husband, Kirsten Gibson, used to work for the [employer]. He has a son from a prior marriage, and 2 grandchildren. The patient attends [employer] [facility] [employer] [facility].   ADVANCED DIRECTIVES:  patient's husband is her healthcare power of attorney  HEALTH MAINTENANCE: Social History   Tobacco Use  . Smoking status: Former Smoker    Packs/day: 1.00    Years: 20.00    Pack years: 20.00    Types: Cigarettes    Last attempt to quit: 02/08/1986    Years since quitting: 31.7  . Smokeless tobacco: Never Used  Substance Use Topics  . Alcohol use: No  . Drug use: No     Colonoscopy:  April 2014, [facility]  PAP:  Bone density: 05/20/2017 T-score of -2.2 osteopenia  Lipid panel:  Allergies  Allergen Reactions  . Latex Dermatitis and Rash  . Adhesive [Tape]     ALLERGIC TO Kirsten......USE Kirsten Gibson INSTEAD !!!  . Trazodone And Nefazodone Nausea Only  . Zofran [Ondansetron Hcl] Swelling    Swelling&redness to face!!  . Diclofenac Other (See Comments)    Fever, chills  . Naproxen Other (See Comments)    Fever, chills    Current Outpatient Medications  Medication Sig Dispense Refill  . aspirin EC 81 MG tablet Take 1 tablet (81 mg total) by mouth daily.    . calcium carbonate (OS-CAL) 600 MG TABS Take 750 mg by mouth daily.     [name] gabapentin (NEURONTIN) 300 MG capsule Take 2 capsules (600 mg total) by mouth at bedtime. 180 capsule 3  . Multiple Vitamins-Minerals (MULTIVITAMIN PO) Take 1 capsule by mouth daily.    . tamoxifen  (NOLVADEX) 20 MG tablet Take 1 tablet (20 mg total) by mouth daily. 90 tablet 3  . zolpidem (AMBIEN) 5 MG tablet TAKE 1 TABLET BY MOUTH EVERY NIGHT AT BEDTIME FOR SLEEP 30 tablet 0   No current facility-administered medications for this visit.     OBJECTIVE: Middle-aged white woman who appears well  Vitals:   11/08/17 1440  BP: (!) 162/86  Pulse: 80  Resp: 18  Temp: 98.8 F (37.1 C)  SpO2: 100%     Body mass index is 21.56 kg/m.    ECOG FS: 0 Filed Weights   11/08/17 1440  Weight: 110 lb 6.4 oz (50.1 kg)   Sclerae unicteric, EOMs intact Oropharynx clear and moist No cervical or supraclavicular adenopathy Lungs no rales or rhonchi Heart regular rate and rhythm  Abd soft, nontender, positive bowel sounds MSK no focal spinal tenderness, no upper extremity lymphedema Neuro: nonfocal, well oriented, appropriate affect Breasts: Status post bilateral mastectomies.  There is no evidence of disease recurrence.  Both axillae are benign.  LAB RESULTS: Lab Results  Component Value Date   WBC 6.7 11/08/2017   NEUTROABS 4.3 11/08/2017   HGB 12.6 11/08/2017   HCT 37.4 11/08/2017   MCV 93.1 11/08/2017   PLT 194 11/08/2017      Chemistry      Component Value Date/Time   NA 137 05/24/2017 0857   NA 140 11/23/2016 0858   K 3.8 05/24/2017 0857   K 4.2 11/23/2016 0858   CL 101 05/24/2017 0857   CL 104 06/28/2012 0950   CO2 27 05/24/2017 0857   CO2 27 11/23/2016 0858   BUN 24 05/24/2017 0857   BUN 24.4 11/23/2016 0858   CREATININE 0.78 05/24/2017 0857   CREATININE 0.9 11/23/2016 0858      Component Value Date/Time   CALCIUM 9.5 05/24/2017 0857   CALCIUM 9.0 11/23/2016 0858   ALKPHOS 66 05/24/2017 0857   ALKPHOS 62 11/23/2016 0858   AST 30 05/24/2017 0857   AST 25 11/23/2016 0858   ALT 25 05/24/2017 0857   ALT 22 11/23/2016 0858   BILITOT 0.3 05/24/2017 0857   BILITOT 0.23 11/23/2016 0858      Lab Results  Component Value Date   LABCA2 21 02/18/2012      STUDIES: No results found.   ASSESSMENT: 72 y.o. [place] woman  (1) status post right breast and axillary lymph node biopsy 05/04/2011, both positive for a grade 2 invasive ductal carcinoma, cT2 pN1 or stage IIB,estrogen receptor 82% and progesterone receptor 92% positive, with an MIB-1 of 12%, and HER-2 amplification by CISH with a ratio of 2.86  (2) biopsy 05/19/2011 of a second right breast area showed low-grade invasive ductal carcinoma, estrogen and progesterone receptor both 100% positive, with an MIB-1 of 13%, and no HER-2 amplification  (3) left breast biopsy 05/19/2011 showed a low-grade invasive ductal carcinoma, e-cadherin positive, HER-2 negative  (4) neoadjuvant treatment consisted of carboplatin/ docetaxel/ trastuzumab x6, started 06/02/2011 and completed 09/29/2011  (5) status post bilateral [facility]astectomies at [facility] October 2013 showing  (a) on the right, a residual 1.2 cm invasive ductal carcinoma in the breast, with 3 of 19 lymph nodes involved; an additional lymph node showed only isolated tumor cells, so this is a ypT1c ypN1 result  (b) on the left, there was a 1 mm area of residual invasive ductal carcinoma, with all 5 sentinel lymph nodes negative  (6) the patient completed postmastectomy radiation to the right chest wall and right supraclavicular fossa 02/28/2012  (7) status post-op trastuzumab/ pertuzumab x3, discontinued 01/28/2012  (8) trastuzumab continued 02/18/2012, completed April 2014. Final echocardiogram 06/26/2012 showed a well preserved ejection fraction.  (9) tamoxifen started 03/25/2012  (10) osteopenia, with bone density 05/23/2014 showing a T score of -1.9  (a) bone density scan on 05/20/2017 with a T-score of -2.2 osteopenia.   (11) history of greater than 20 pack year smoking, low-dose screening chest CT SEPT 2015 negative  (a) CT chest 06/04/2016 shows no evidence of malignancy.  (12) blood pressure and blood draws preferentially  left arm  PLAN:  Eladia is now 6 years out from definitive surgery for her breast cancer with no evidence of disease recurrence.  This is very favorable.  She continues on tamoxifen with good tolerance.  The plan is to  continue that for a total of 10 years.  She wanted me to update her medication list at which I was glad to do.  She requested a refill on her Ambien and I explained that taking the drug more frequently than every 3 or 4 days will induce tolerance.  She accordingly will take it no more than a maximum of twice a week.  To remove some no longer active diagnoses from her "my chart" and I was glad to do that for her  I offered to see her on a once a year basis but she really does want to see me every 6 months and I will be glad to accommodate that  She knows to call for any other issues that may develop before the next visit.  Izen Petz, Virgie Dad, MD  11/08/17 2:56 PM Medical Oncology and Hematology [facility] [address] [place], [place] [address] Tel. [phone]    Fax. [phone]  Alice Rieger, am acting as scribe for Chauncey Cruel MD.  I, Lurline Del MD, have reviewed the above documentation for accuracy and completeness, and I agree with the above.

## 2017-11-08 ENCOUNTER — Inpatient Hospital Stay: Payer: Medicare Other | Attending: Oncology

## 2017-11-08 ENCOUNTER — Telehealth: Payer: Self-pay | Admitting: Oncology

## 2017-11-08 ENCOUNTER — Inpatient Hospital Stay (HOSPITAL_BASED_OUTPATIENT_CLINIC_OR_DEPARTMENT_OTHER): Payer: Medicare Other | Admitting: Oncology

## 2017-11-08 ENCOUNTER — Other Ambulatory Visit: Payer: Self-pay | Admitting: *Deleted

## 2017-11-08 VITALS — BP 162/86 | HR 80 | Temp 98.8°F | Resp 18 | Ht 60.0 in | Wt 110.4 lb

## 2017-11-08 DIAGNOSIS — Z170 Estrogen receptor positive status [ER+]: Secondary | ICD-10-CM | POA: Insufficient documentation

## 2017-11-08 DIAGNOSIS — C50411 Malignant neoplasm of upper-outer quadrant of right female breast: Secondary | ICD-10-CM | POA: Diagnosis not present

## 2017-11-08 DIAGNOSIS — Z79810 Long term (current) use of selective estrogen receptor modulators (SERMs): Secondary | ICD-10-CM

## 2017-11-08 DIAGNOSIS — C773 Secondary and unspecified malignant neoplasm of axilla and upper limb lymph nodes: Secondary | ICD-10-CM | POA: Diagnosis not present

## 2017-11-08 DIAGNOSIS — C50619 Malignant neoplasm of axillary tail of unspecified female breast: Secondary | ICD-10-CM

## 2017-11-08 DIAGNOSIS — C50412 Malignant neoplasm of upper-outer quadrant of left female breast: Secondary | ICD-10-CM

## 2017-11-08 DIAGNOSIS — F419 Anxiety disorder, unspecified: Secondary | ICD-10-CM

## 2017-11-08 DIAGNOSIS — M8580 Other specified disorders of bone density and structure, unspecified site: Secondary | ICD-10-CM | POA: Diagnosis not present

## 2017-11-08 LAB — CBC WITH DIFFERENTIAL/PLATELET
BASOS ABS: 0.1 10*3/uL (ref 0.0–0.1)
BASOS PCT: 1 %
Eosinophils Absolute: 0.1 10*3/uL (ref 0.0–0.5)
Eosinophils Relative: 1 %
HEMATOCRIT: 37.4 % (ref 34.8–46.6)
Hemoglobin: 12.6 g/dL (ref 11.6–15.9)
Lymphocytes Relative: 26 %
Lymphs Abs: 1.8 10*3/uL (ref 0.9–3.3)
MCH: 31.4 pg (ref 25.1–34.0)
MCHC: 33.7 g/dL (ref 31.5–36.0)
MCV: 93.1 fL (ref 79.5–101.0)
MONO ABS: 0.5 10*3/uL (ref 0.1–0.9)
Monocytes Relative: 7 %
NEUTROS ABS: 4.3 10*3/uL (ref 1.5–6.5)
Neutrophils Relative %: 65 %
PLATELETS: 194 10*3/uL (ref 145–400)
RBC: 4.02 MIL/uL (ref 3.70–5.45)
RDW: 13.3 % (ref 11.2–14.5)
WBC: 6.7 10*3/uL (ref 3.9–10.3)

## 2017-11-08 LAB — COMPREHENSIVE METABOLIC PANEL
ALK PHOS: 57 U/L (ref 38–126)
ALT: 20 U/L (ref 0–44)
ANION GAP: 8 (ref 5–15)
AST: 28 U/L (ref 15–41)
Albumin: 3.9 g/dL (ref 3.5–5.0)
BILIRUBIN TOTAL: 0.4 mg/dL (ref 0.3–1.2)
BUN: 20 mg/dL (ref 8–23)
CALCIUM: 9.5 mg/dL (ref 8.9–10.3)
CO2: 29 mmol/L (ref 22–32)
Chloride: 104 mmol/L (ref 98–111)
Creatinine, Ser: 0.78 mg/dL (ref 0.44–1.00)
GFR calc non Af Amer: >60 mL/min (ref >60)
GLUCOSE: 91 mg/dL (ref 70–99)
Potassium: 3.9 mmol/L (ref 3.5–5.1)
Sodium: 141 mmol/L (ref 135–145)
TOTAL PROTEIN: 6.9 g/dL (ref 6.5–8.1)

## 2017-11-08 LAB — FERRITIN: FERRITIN: 36 ng/mL (ref 11–307)

## 2017-11-08 LAB — TSH: TSH: 1.339 u[IU]/mL (ref 0.308–3.960)

## 2017-11-08 MED ORDER — ZOLPIDEM TARTRATE 5 MG PO TABS: ORAL_TABLET | ORAL | 0 refills | Status: DC

## 2017-11-08 MED ORDER — GABAPENTIN 300 MG PO CAPS: 300.0000 mg | ORAL_CAPSULE | Freq: Every day | ORAL | 3 refills | Status: DC

## 2017-11-08 MED ORDER — TAMOXIFEN CITRATE 20 MG PO TABS: 20.0000 mg | ORAL_TABLET | Freq: Every day | ORAL | 3 refills | Status: DC

## 2017-11-08 NOTE — Telephone Encounter (Signed)
Gave pt avs and calendar  °

## 2018-01-09 DIAGNOSIS — Z808 Family history of malignant neoplasm of other organs or systems: Secondary | ICD-10-CM | POA: Diagnosis not present

## 2018-01-09 DIAGNOSIS — L720 Epidermal cyst: Secondary | ICD-10-CM | POA: Diagnosis not present

## 2018-01-09 DIAGNOSIS — L719 Rosacea, unspecified: Secondary | ICD-10-CM | POA: Diagnosis not present

## 2018-01-09 DIAGNOSIS — L814 Other melanin hyperpigmentation: Secondary | ICD-10-CM | POA: Diagnosis not present

## 2018-01-09 DIAGNOSIS — Z23 Encounter for immunization: Secondary | ICD-10-CM | POA: Diagnosis not present

## 2018-01-09 DIAGNOSIS — D225 Melanocytic nevi of trunk: Secondary | ICD-10-CM | POA: Diagnosis not present

## 2018-01-09 DIAGNOSIS — L821 Other seborrheic keratosis: Secondary | ICD-10-CM | POA: Diagnosis not present

## 2018-04-08 ENCOUNTER — Other Ambulatory Visit: Payer: Self-pay | Admitting: Oncology

## 2018-04-08 DIAGNOSIS — F419 Anxiety disorder, unspecified: Secondary | ICD-10-CM

## 2018-04-08 DIAGNOSIS — C50411 Malignant neoplasm of upper-outer quadrant of right female breast: Secondary | ICD-10-CM

## 2018-04-26 DIAGNOSIS — Z23 Encounter for immunization: Secondary | ICD-10-CM | POA: Diagnosis not present

## 2018-04-26 DIAGNOSIS — L219 Seborrheic dermatitis, unspecified: Secondary | ICD-10-CM | POA: Diagnosis not present

## 2018-04-26 DIAGNOSIS — L820 Inflamed seborrheic keratosis: Secondary | ICD-10-CM | POA: Diagnosis not present

## 2018-05-08 DIAGNOSIS — H93292 Other abnormal auditory perceptions, left ear: Secondary | ICD-10-CM | POA: Diagnosis not present

## 2018-05-08 DIAGNOSIS — H9312 Tinnitus, left ear: Secondary | ICD-10-CM | POA: Diagnosis not present

## 2018-05-09 ENCOUNTER — Telehealth: Payer: Self-pay | Admitting: Oncology

## 2018-05-09 NOTE — Telephone Encounter (Signed)
Returned call to patient regarding rescheduling appointment. Per patient appointment moved from April to July. Message to provider.

## 2018-05-10 ENCOUNTER — Telehealth: Payer: Self-pay

## 2018-05-10 NOTE — Telephone Encounter (Signed)
VM received from pt regarding appt's in July.  Returned call to number provided.  No answer.  LVM for pt to return call.

## 2018-05-16 ENCOUNTER — Other Ambulatory Visit: Payer: Medicare Other

## 2018-05-16 ENCOUNTER — Ambulatory Visit: Payer: Medicare Other | Admitting: Oncology

## 2018-08-15 ENCOUNTER — Ambulatory Visit: Payer: Medicare Other | Admitting: Oncology

## 2018-08-15 ENCOUNTER — Other Ambulatory Visit: Payer: Medicare Other

## 2018-12-01 IMAGING — CT CT CHEST NODULE FOLLOW UP LOW DOSE W/O CM
2 of 4 series · 15 of 36 positions shown, 18 images · non-contrast
Comparison: Chest CT 11/02/2013.

CLINICAL DATA: 71-year-old female with history of right-sided
breast cancer. Follow-up evaluation for pulmonary nodule noted on
prior examination.

EXAM:
CT CHEST WITHOUT CONTRAST
TECHNIQUE: Multidetector CT imaging of the chest was performed following the
standard protocol without IV contrast.

[Series 4: st thins · axial · 0.59mm/px · z∈[-473,-209]mm · 12 of 576 slices shown, 15 images]
[im 24/576  mediastinal]
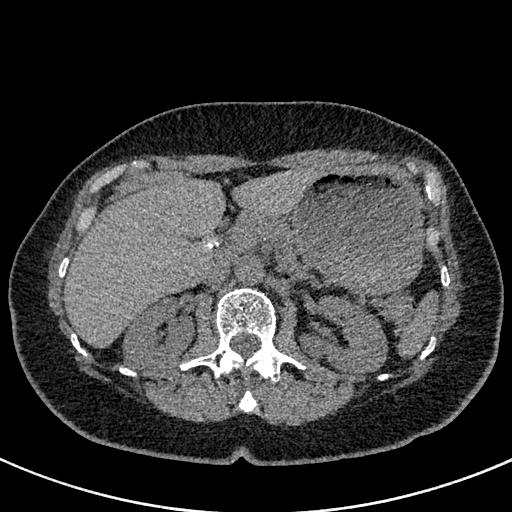
[im 24/576  lung]
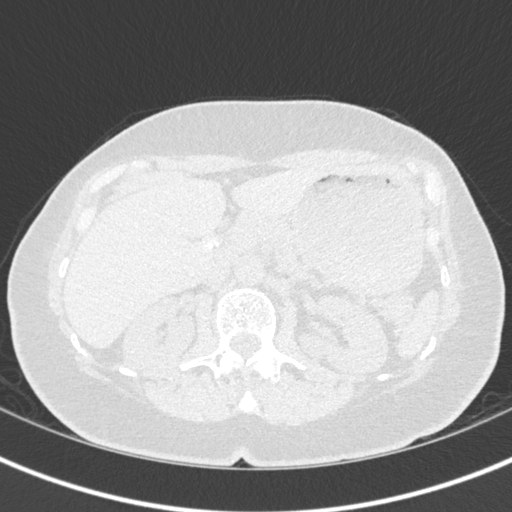
[im 72/576  lung]
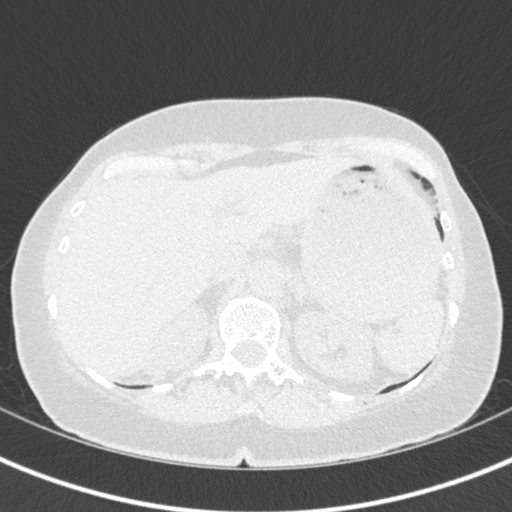
[im 120/576  lung]
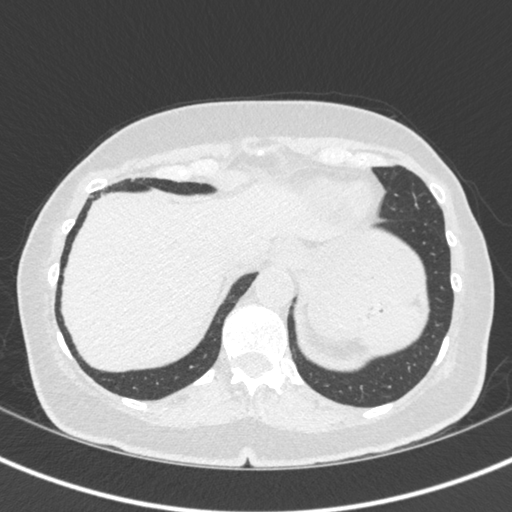
[im 168/576  lung]
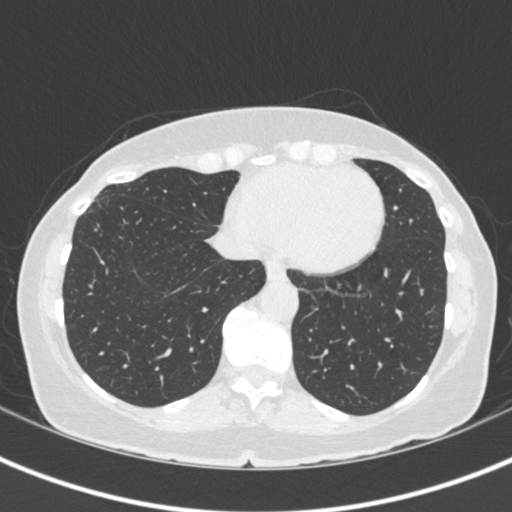
[im 216/576  mediastinal]
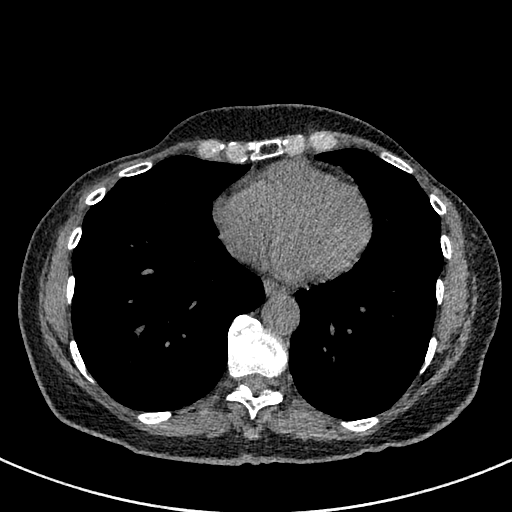
[im 216/576  lung]
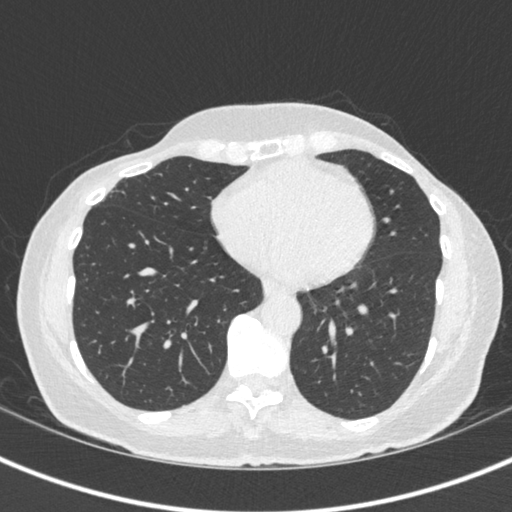
[im 264/576  lung]
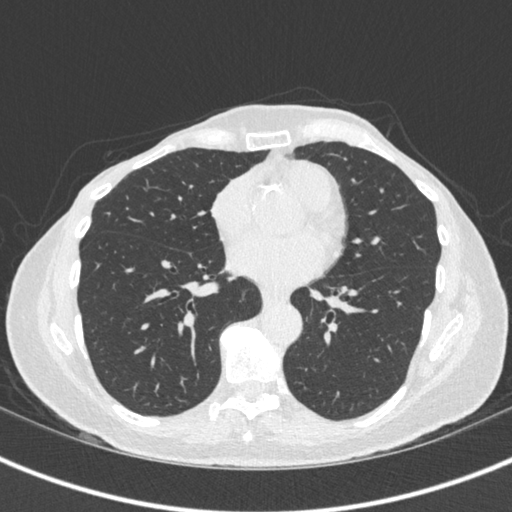
[im 312/576  lung]
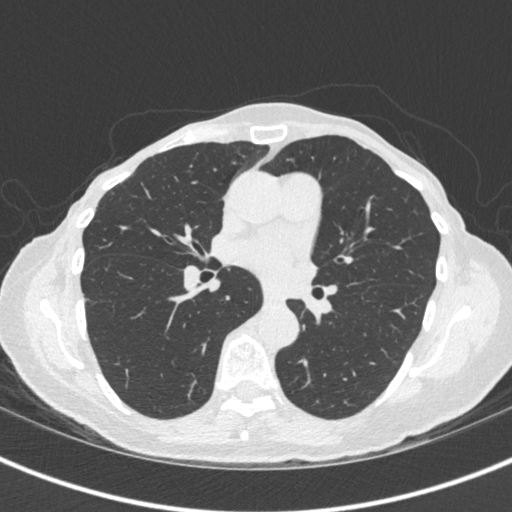
[im 360/576  lung]
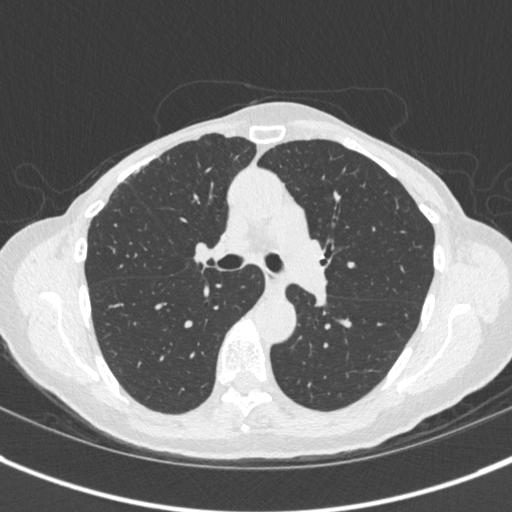
[im 408/576  mediastinal]
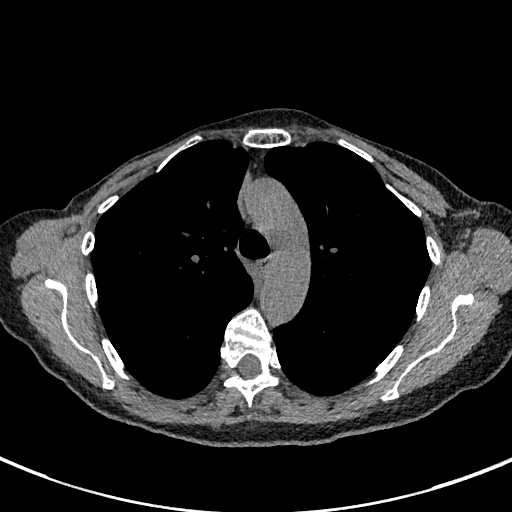
[im 408/576  lung]
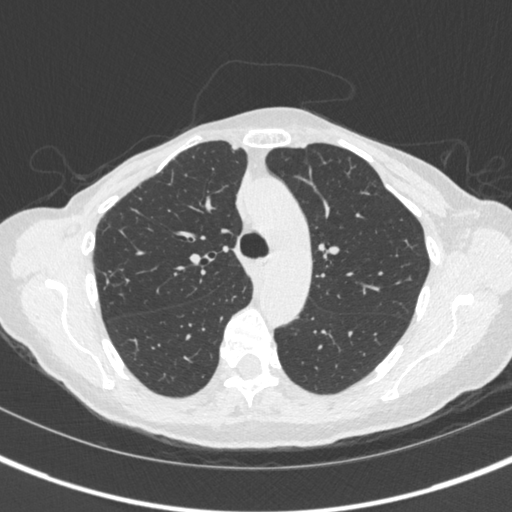
[im 456/576  lung]
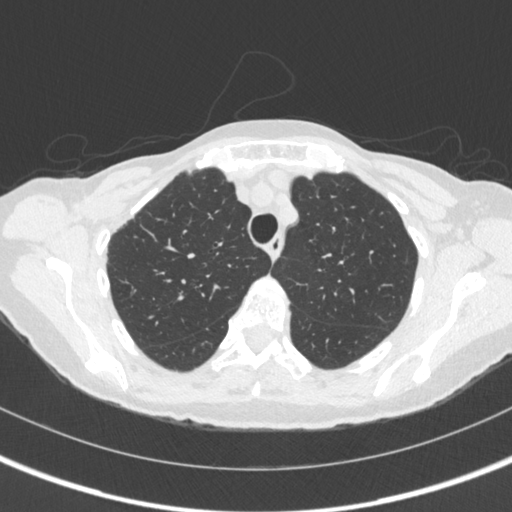
[im 504/576  lung]
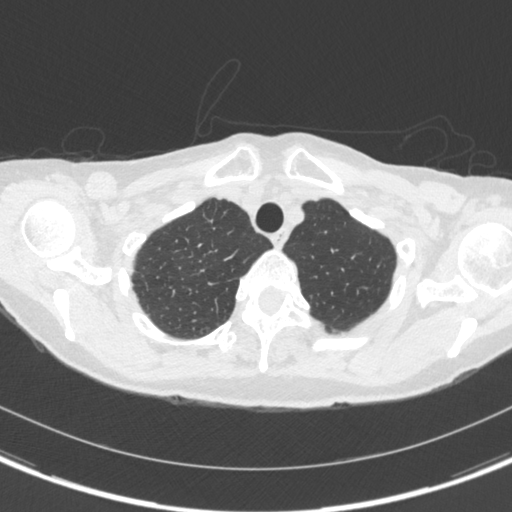
[im 552/576  lung]
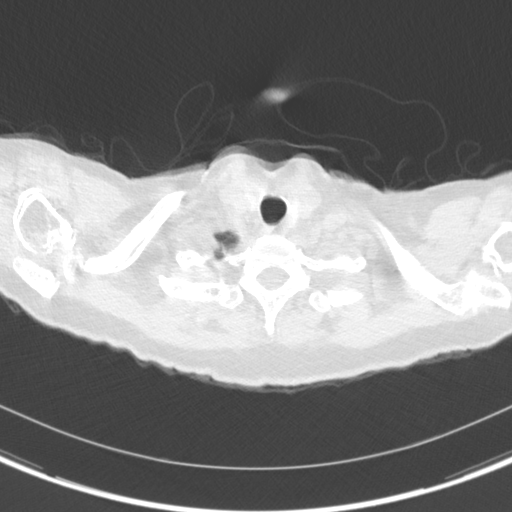

[Series 5: coronal · coronal · 0.62mm/px · 3 of 204 slices shown]
[im 41/204  lung]
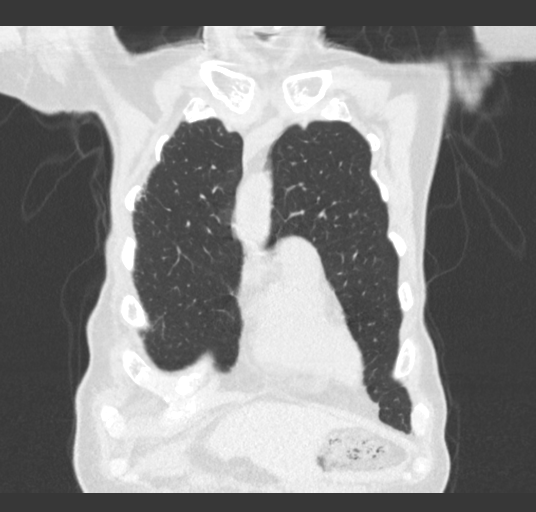
[im 82/204  lung]
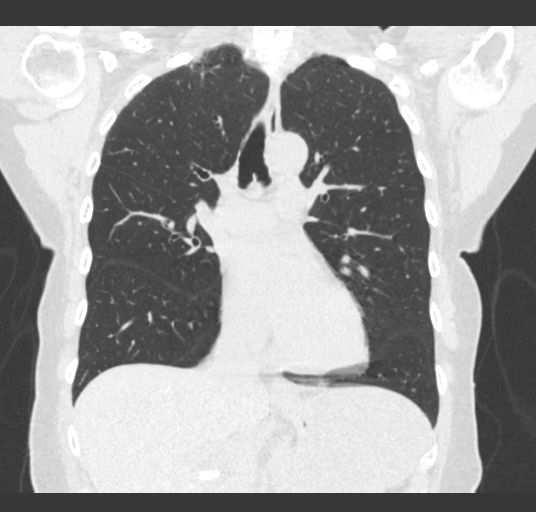
[im 122/204  lung]
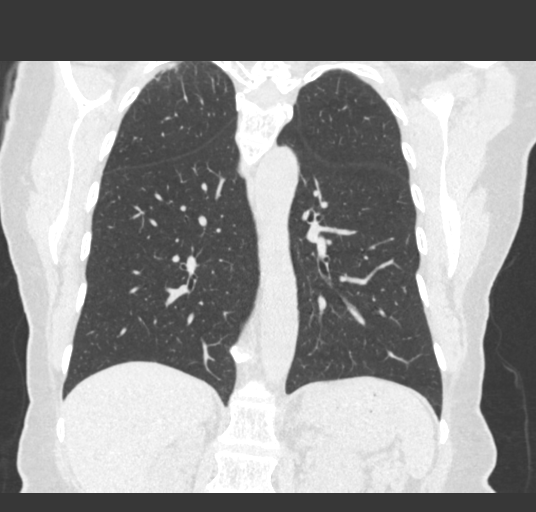

[15 of 36 positions shown; findings below may reference images not displayed]

FINDINGS: Cardiovascular: Heart size is normal. There is no significant
pericardial fluid, thickening or pericardial calcification. Aortic
atherosclerosis. No definite coronary artery calcifications are
identified.

Mediastinum/Nodes: No pathologically enlarged mediastinal or hilar
lymph nodes. Please note that accurate exclusion of hilar adenopathy
is limited on noncontrast CT scans. Esophagus is unremarkable in
appearance. No axillary lymphadenopathy.

Lungs/Pleura: The small cluster of peribronchovascular nodularity in
the lateral aspect of the right upper lobe is still present, but is
less apparent. Specifically, the largest of these nodules have
regressed. This area remains most compatible with regions of mucoid
impaction within terminal bronchioles. No other larger more
suspicious appearing pulmonary nodules or masses are noted. There is
no acute consolidative airspace disease and no pleural effusions.
Bilateral (right greater than left) apical pleuroparenchymal
thickening is again noted. The changes in the right apex are likely
related to prior radiation therapy to the axilla.

Upper Abdomen: 5 mm nonobstructive calculus in the upper pole
collecting system of left kidney. 1.7 cm low-attenuation lesion in
the interpolar region of the right kidney is incompletely
characterized on today's noncontrast CT examination, but is
statistically likely a cyst. Aortic atherosclerosis. Status post
cholecystectomy.

Musculoskeletal: There are no aggressive appearing lytic or blastic
lesions noted in the visualized portions of the skeleton.
IMPRESSION: 1. Small cluster of nodularity in the periphery of the right upper
lobe is less apparent than the prior study, and remains compatible
with a benign area of mucoid impaction within terminal bronchioles.
No suspicious pulmonary nodules are noted.
2. Aortic atherosclerosis.
3. 5 mm nonobstructive calculus in the upper pole collecting system
of the left kidney.
4. Additional chronic findings, as above.

## 2019-01-19 ENCOUNTER — Other Ambulatory Visit: Payer: Self-pay | Admitting: Oncology

## 2019-01-19 DIAGNOSIS — F419 Anxiety disorder, unspecified: Secondary | ICD-10-CM

## 2019-01-19 DIAGNOSIS — C50411 Malignant neoplasm of upper-outer quadrant of right female breast: Secondary | ICD-10-CM

## 2019-01-30 ENCOUNTER — Other Ambulatory Visit: Payer: Self-pay | Admitting: Oncology

## 2019-01-30 DIAGNOSIS — F419 Anxiety disorder, unspecified: Secondary | ICD-10-CM

## 2019-01-30 DIAGNOSIS — C50411 Malignant neoplasm of upper-outer quadrant of right female breast: Secondary | ICD-10-CM

## 2019-01-30 NOTE — Telephone Encounter (Signed)
Due to the zolipedem - please refill - all meds ( I cannot just do the two and leave the zolipidem- thanks val

## 2019-03-29 ENCOUNTER — Telehealth: Payer: Self-pay

## 2019-03-29 NOTE — Telephone Encounter (Signed)
TC from Pt  stating she had her first Covid vaccine done on 03/06/19 Pt stated that she had a double mastectomy and decided to use the left arm which had less lymph nodes removed than the right arm. Pt stated that she had a rash after her injection and wanted to know if she should get the second vaccine in her left arm. Informed Pt that the rash is a delayed reaction to the vaccine which she shouldn't worry about since it went away. Also informed her that in talking with Dr. Jana Hakim his recommendation for her is to get the second injection in the left arm as well. Pt verbalized understanding. No further problems or concerns noted.

## 2019-05-18 ENCOUNTER — Other Ambulatory Visit: Payer: Self-pay | Admitting: *Deleted

## 2019-05-18 DIAGNOSIS — R799 Abnormal finding of blood chemistry, unspecified: Secondary | ICD-10-CM

## 2019-05-18 DIAGNOSIS — C50411 Malignant neoplasm of upper-outer quadrant of right female breast: Secondary | ICD-10-CM

## 2019-05-18 DIAGNOSIS — Z862 Personal history of diseases of the blood and blood-forming organs and certain disorders involving the immune mechanism: Secondary | ICD-10-CM

## 2019-05-18 DIAGNOSIS — Z0189 Encounter for other specified special examinations: Secondary | ICD-10-CM

## 2019-05-18 DIAGNOSIS — R768 Other specified abnormal immunological findings in serum: Secondary | ICD-10-CM

## 2019-05-18 DIAGNOSIS — E559 Vitamin D deficiency, unspecified: Secondary | ICD-10-CM

## 2019-05-18 DIAGNOSIS — Z8639 Personal history of other endocrine, nutritional and metabolic disease: Secondary | ICD-10-CM

## 2019-05-18 DIAGNOSIS — Z9221 Personal history of antineoplastic chemotherapy: Secondary | ICD-10-CM

## 2019-05-18 DIAGNOSIS — Z13220 Encounter for screening for lipoid disorders: Secondary | ICD-10-CM

## 2019-05-18 DIAGNOSIS — Z170 Estrogen receptor positive status [ER+]: Secondary | ICD-10-CM

## 2019-05-18 DIAGNOSIS — M8580 Other specified disorders of bone density and structure, unspecified site: Secondary | ICD-10-CM

## 2019-05-20 NOTE — Progress Notes (Signed)
ID: Kirsten Gibson   DOB: Aug 07, 1945  MR#: [id number]  ZJQ#:[id number]  Patient Care Team: [name], MD as PCP - General (Family Medicine) Thea Silversmith, MD (Radiation Oncology) Sheilah Mins, MD as Surgeon (Surgical Oncology) Deshon Hsiao, Virgie Dad, MD as Consulting Physician (Oncology) Renaldo Harrison, MD as Referring Physician (Dermatology) OTHER MD:  CHIEF COMPLAINT: estrogen receptor positive breast cancer (s/p bilateral mastectomies)  CURRENT TREATMENT: tamoxifen   INTERVAL HISTORY: [name] returns today for follow-up of her estrogen receptor positive breast cancer.   She continues on tamoxifen, with good tolerance. She tries to take it every morning after breakfast.  Hot flashes have not been a major issue.  She has been feeling nauseated over the last year.  This likely was not related to tamoxifen although when she did change the time when she took it things improved.  Since her last visit, she has not undergone any additional studies.   REVIEW OF SYSTEMS: Kirsten Gibson is not exercising regularly.  She used to be very regular and her training but with the pandemic and other things she has fallen out of the habit.  She wonders if taking gabapentin 300 at bedtime is too much.  Sometimes she thinks it is too little.  She would like to try taking 100/200 and see if that works equally well or better.  She would like a refill on her zolpidem.  She received 20 in December and I checked PMP aware and no one else is prescribing this drug for her.  This is discussed further below.  She is concerned about the left lower extremity swelling and redness.  She does have a history of mycosis fungoides which was followed at [facility] but I do not see she has seen Dr. Irish Elders there for a decade.  HISTORY OF PRESENT ILLNESS: From the original intake note:  She palpated a right breast mass March 2013 and presented for a mammogram. This confirmed the presence of a 1.9 cm mass in the upper outer quadrants.  And adjacent mass was noted which was possibly thought to represent a intramammary lymph node. A right axillary lymph node was also noted to be enlarged. Biopsy of the upper outer quadrant mass showed a grade 2 invasive ductal carcinoma which was ER/PR positive HER-2 positive. Ki 67 was 12%. An MRI of the breast was performed 05/10/2011 which showed an overall area of the right breast measuring 5.0 x 2.6 x 2.2 cm of clumped linear enhancement. Majority of this was felt to represent DCIS. A small area of clumped enhancement was noted in the left breast which was not seen on the mammogram. A biopsy of this area was recommended. A biopsy of the right axillary lymph node was also positive for invasive ductal carcinoma.   The patient's subsequent history is as detailed below   PAST MEDICAL HISTORY: Past Medical History:  Diagnosis Date  . Anemia due to chemotherapy 08/23/2011  . Anxiety   . Breast cancer ([place])    bilateral infiltrating ductal carcinoma  . Hx of radiation therapy 01/11/12- 02/28/12   right chest wall/supraclav fossa/drain site/scar boost  . Mycosis fungoides    follows up with [facility] once to twice per year   . Rosacea   . S/P chemotherapy, time since 4-12 weeks   . TMJ click    improved after changing the way dental exams were done  . Urinary frequency     PAST SURGICAL HISTORY: Past Surgical History:  Procedure Laterality Date  . ABDOMINAL HYSTERECTOMY  1988   partial with on ovary remaining.  . benign tumor removed from neck  1974  . BREAST BIOPSY  05/19/11   left  and right breast  . BREAST EXCISIONAL BIOPSY  1991   benign  . CHOLECYSTECTOMY    . MASTECTOMY  11/2011   bilat, [place] Left 04/06/2012   Procedure: MINOR REMOVAL PORT-A-CATH;  Surgeon: [name], MD;  Location: [facility];  Service: General;  Laterality: Left;  . PORTACATH PLACEMENT  05/21/2011   Procedure: INSERTION PORT-A-CATH;  Surgeon: [name], MD;  Location: [facility];  Service: General;  Laterality: Left;    FAMILY HISTORY Family History  Problem Relation Age of Onset  . Cancer Maternal Aunt        breast to brain  . Cancer Paternal Grandmother        breast and/or ovarian///unknown  . Stomach cancer Paternal Grandfather        stomach  . Colon cancer Neg Hx    the patient's father died at age 40 in January 2549 from complications of diabetes. The patient had no brothers, 3 sisters. One of the patient's mother's 2 sisters was diagnosed with breast cancer in her late 26s. There is no other history of breast or ovarian cancer in the family. The patient tells me she was evaluated for genetic testing at [facility], but that the insurance would not approve of the test.   GYNECOLOGIC HISTORY: Menarche age 53, she is GX P0. She underwent hysterectomy and unilateral salpingo-oophorectomy in 1988. She took hormone replacement for approximately 3 years.   SOCIAL HISTORY: She used to work at [employer], but is now retired. Her husband, Kirsten Gibson, used to work for the [employer]. He has a son from a prior marriage, and 2 grandchildren. The patient attends [employer] [place] [employer] [place].   ADVANCED DIRECTIVES:  patient's husband is her healthcare power of attorney   HEALTH MAINTENANCE: Social History   Tobacco Use  . Smoking status: Former Smoker    Packs/day: 1.00    Years: 20.00    Pack years: 20.00    Types: Cigarettes    Quit date: 02/08/1986    Years since quitting: 33.3  . Smokeless tobacco: Never Used  Substance Use Topics  . Alcohol use: No  . Drug use: No     Colonoscopy:  April 2014, [facility]  PAP:  Bone density: 05/20/2017 T-score of -2.2 osteopenia  Lipid panel:  Allergies  Allergen Reactions  . Latex Dermatitis and Rash  . Adhesive [Tape]     ALLERGIC TO OP-SITE......USE OP-SITE FLEXIGRID INSTEAD !!!  . Trazodone And Nefazodone Nausea Only  . Zofran  [Ondansetron Hcl] Swelling    Swelling&redness to face!!  . Diclofenac Other (See Comments)    Fever, chills  . Naproxen Other (See Comments)    Fever, chills    Current Outpatient Medications  Medication Sig Dispense Refill  . aspirin EC 81 MG tablet Take 1 tablet (81 mg total) by mouth once a week.    . calcium carbonate (OS-CAL) 600 MG TABS Take 750 mg by mouth daily.     [name] gabapentin (NEURONTIN) 100 MG capsule Take 1-2 capsules (100-200 mg total) by mouth at bedtime. 90 capsule 4  . gabapentin (NEURONTIN) 300 MG capsule TAKE 1 CAPSULE BY MOUTH AT BEDTIME 180 capsule 3  . Multiple Vitamins-Minerals (MULTIVITAMIN PO) Take 1 capsule by mouth daily.    . tamoxifen (  NOLVADEX) 20 MG tablet TAKE 1 TABLET(20 MG) BY MOUTH DAILY 90 tablet 3  . zolpidem (AMBIEN) 5 MG tablet TAKE 1 TABLET BY MOUTH EVERY DAY AT BEDTIME 20 tablet 0   No current facility-administered medications for this visit.    OBJECTIVE: white woman in no acute distress  Vitals:   05/21/19 1108  BP: (!) 161/88  Pulse: 76  Resp: 18  Temp: 98.2 F (36.8 C)  SpO2: 100%     Body mass index is 19.37 kg/m.    ECOG FS: 0 Filed Weights   05/21/19 1108  Weight: 99 lb 3.2 oz (45 kg)    Sclerae unicteric, EOMs intact Wearing a mask No cervical or supraclavicular adenopathy Lungs no rales or rhonchi Heart regular rate and rhythm Abd soft, nontender, positive bowel sounds MSK no focal spinal tenderness, minimal left lower extremity swelling and slightly prominent vascularity Neuro: nonfocal, well oriented, appropriate affect Breasts: Status post bilateral mastectomies.  No evidence of local recurrence.   Bilateral lower extremities 05/21/2019    LAB RESULTS: Lab Results  Component Value Date   WBC 6.4 05/21/2019   NEUTROABS 3.8 05/21/2019   HGB 13.6 05/21/2019   HCT 41.0 05/21/2019   MCV 94.0 05/21/2019   PLT 216 05/21/2019      Chemistry      Component Value Date/Time   NA 138 05/21/2019 1030   NA 140  11/23/2016 0858   K 3.6 05/21/2019 1030   K 4.2 11/23/2016 0858   CL 103 05/21/2019 1030   CL 104 06/28/2012 0950   CO2 26 05/21/2019 1030   CO2 27 11/23/2016 0858   BUN 16 05/21/2019 1030   BUN 24.4 11/23/2016 0858   CREATININE 0.76 05/21/2019 1030   CREATININE 0.9 11/23/2016 0858      Component Value Date/Time   CALCIUM 9.0 05/21/2019 1030   CALCIUM 9.0 11/23/2016 0858   ALKPHOS 56 05/21/2019 1030   ALKPHOS 62 11/23/2016 0858   AST 29 05/21/2019 1030   AST 25 11/23/2016 0858   ALT 28 05/21/2019 1030   ALT 22 11/23/2016 0858   BILITOT 0.4 05/21/2019 1030   BILITOT 0.23 11/23/2016 0858      Lab Results  Component Value Date   LABCA2 21 02/18/2012    STUDIES: No results found.   ASSESSMENT: 74 y.o. [place] woman  (1) status post right breast and axillary lymph node biopsy 05/04/2011, both positive for a grade 2 invasive ductal carcinoma, cT2 pN1 or stage IIB,estrogen receptor 82% and progesterone receptor 92% positive, with an MIB-1 of 12%, and HER-2 amplification by CISH with a ratio of 2.86  (2) biopsy 05/19/2011 of a second right breast area showed low-grade invasive ductal carcinoma, estrogen and progesterone receptor both 100% positive, with an MIB-1 of 13%, and no HER-2 amplification  (3) left breast biopsy 05/19/2011 showed a low-grade invasive ductal carcinoma, e-cadherin positive, HER-2 negative  (4) neoadjuvant treatment consisted of carboplatin/ docetaxel/ trastuzumab x6, started 06/02/2011 and completed 09/29/2011  (5) status post bilateral mastectomies at [facility] October 2013 showing  (a) on the right, a residual 1.2 cm invasive ductal carcinoma in the breast, with 3 of 19 lymph nodes involved; an additional lymph node showed only isolated tumor cells, so this is a ypT1c ypN1 result  (b) on the left, there was a 1 mm area of residual invasive ductal carcinoma, with all 5 sentinel lymph nodes negative  (6) the patient completed postmastectomy  radiation to the right chest wall and right supraclavicular  fossa 02/28/2012  (7) status post-op trastuzumab/ pertuzumab x3, discontinued 01/28/2012  (8) trastuzumab continued 02/18/2012, completed April 2014. Final echocardiogram 06/26/2012 showed a well preserved ejection fraction.  (9) tamoxifen started 03/25/2012  (10) osteopenia, with bone density 05/23/2014 showing a T score of -1.9  (a) bone density scan on 05/20/2017 with a T-score of -2.2 osteopenia.   (11) history of greater than 20 pack year smoking, low-dose screening chest CT SEPT 2015 negative  (a) CT chest 06/04/2016 shows no evidence of malignancy.  (12) blood pressure and blood draws preferentially left arm  (13) history of mycosis fungoides initially diagnosed October 2006 with a Sezary cell prep of 0; seen by Adelene Idler at [facility].   PLAN:  Raymona is now 7 years out from definitive surgery for her breast cancer with no evidence of disease recurrence.  This is very favorable.  She is tolerating tamoxifen well and the plan is to continue that a total of 10 years.  We discussed this very minimal left lower extremity swelling and slight vascularity.  I think she has venous insufficiency.  I recommended a compression stocking.  After much discussion she agreed to give it a try.  We discussed the possibility of a Doppler.  She already had this 2 years ago for the same problem and I did not feel this was needed.  She was in agreement.  Also this does not look like active mycosis fungoides, which she does have a history of as detailed above  The plan then is to continue tamoxifen to a total of 10 years.  She will see me again a year from now.  She knows to call for any other issue that may develop before then  Total encounter time 35 minutes.*   Amber Guthridge, Virgie Dad, MD  05/21/19 12:46 PM Medical Oncology and Hematology [facility] [place], [place] [address] Tel. [phone]    Fax.  [phone]   I, Wilburn Mylar, am acting as scribe for Dr. Virgie Dad. Darrold Bezek.  I, Lurline Del MD, have reviewed the above documentation for accuracy and completeness, and I agree with the above.   *Total Encounter Time as defined by the Centers for Medicare and Medicaid Services includes, in addition to the face-to-face time of a patient visit (documented in the note above) non-face-to-face time: obtaining and reviewing outside history, ordering and reviewing medications, tests or procedures, care coordination (communications with other health care professionals or caregivers) and documentation in the medical record.

## 2019-05-21 ENCOUNTER — Inpatient Hospital Stay: Payer: Medicare Other | Attending: Oncology | Admitting: Oncology

## 2019-05-21 ENCOUNTER — Other Ambulatory Visit: Payer: Medicare Other

## 2019-05-21 ENCOUNTER — Inpatient Hospital Stay: Payer: Medicare Other

## 2019-05-21 ENCOUNTER — Other Ambulatory Visit: Payer: Self-pay

## 2019-05-21 VITALS — BP 161/88 | HR 76 | Temp 98.2°F | Resp 18 | Ht 60.0 in | Wt 99.2 lb

## 2019-05-21 DIAGNOSIS — Z862 Personal history of diseases of the blood and blood-forming organs and certain disorders involving the immune mechanism: Secondary | ICD-10-CM

## 2019-05-21 DIAGNOSIS — C50411 Malignant neoplasm of upper-outer quadrant of right female breast: Secondary | ICD-10-CM

## 2019-05-21 DIAGNOSIS — Z79899 Other long term (current) drug therapy: Secondary | ICD-10-CM | POA: Diagnosis not present

## 2019-05-21 DIAGNOSIS — L719 Rosacea, unspecified: Secondary | ICD-10-CM

## 2019-05-21 DIAGNOSIS — Z79810 Long term (current) use of selective estrogen receptor modulators (SERMs): Secondary | ICD-10-CM | POA: Diagnosis not present

## 2019-05-21 DIAGNOSIS — M8580 Other specified disorders of bone density and structure, unspecified site: Secondary | ICD-10-CM | POA: Diagnosis not present

## 2019-05-21 DIAGNOSIS — Z13220 Encounter for screening for lipoid disorders: Secondary | ICD-10-CM

## 2019-05-21 DIAGNOSIS — Z8639 Personal history of other endocrine, nutritional and metabolic disease: Secondary | ICD-10-CM

## 2019-05-21 DIAGNOSIS — E559 Vitamin D deficiency, unspecified: Secondary | ICD-10-CM | POA: Diagnosis not present

## 2019-05-21 DIAGNOSIS — F419 Anxiety disorder, unspecified: Secondary | ICD-10-CM | POA: Diagnosis not present

## 2019-05-21 DIAGNOSIS — Z170 Estrogen receptor positive status [ER+]: Secondary | ICD-10-CM

## 2019-05-21 DIAGNOSIS — M7989 Other specified soft tissue disorders: Secondary | ICD-10-CM | POA: Diagnosis not present

## 2019-05-21 DIAGNOSIS — C50911 Malignant neoplasm of unspecified site of right female breast: Secondary | ICD-10-CM | POA: Diagnosis not present

## 2019-05-21 DIAGNOSIS — Z0189 Encounter for other specified special examinations: Secondary | ICD-10-CM

## 2019-05-21 DIAGNOSIS — R030 Elevated blood-pressure reading, without diagnosis of hypertension: Secondary | ICD-10-CM | POA: Diagnosis not present

## 2019-05-21 DIAGNOSIS — I998 Other disorder of circulatory system: Secondary | ICD-10-CM | POA: Diagnosis not present

## 2019-05-21 DIAGNOSIS — R768 Other specified abnormal immunological findings in serum: Secondary | ICD-10-CM

## 2019-05-21 DIAGNOSIS — Z8572 Personal history of non-Hodgkin lymphomas: Secondary | ICD-10-CM | POA: Insufficient documentation

## 2019-05-21 DIAGNOSIS — R799 Abnormal finding of blood chemistry, unspecified: Secondary | ICD-10-CM

## 2019-05-21 DIAGNOSIS — Z0183 Encounter for blood typing: Secondary | ICD-10-CM | POA: Diagnosis not present

## 2019-05-21 DIAGNOSIS — Z9221 Personal history of antineoplastic chemotherapy: Secondary | ICD-10-CM

## 2019-05-21 LAB — CBC WITH DIFFERENTIAL (CANCER CENTER ONLY)
Abs Immature Granulocytes: 0.01 10*3/uL (ref 0.00–0.07)
Basophils Absolute: 0.1 10*3/uL (ref 0.0–0.1)
Basophils Relative: 1 %
Eosinophils Absolute: 0.1 10*3/uL (ref 0.0–0.5)
Eosinophils Relative: 1 %
HCT: 41 % (ref 36.0–46.0)
Hemoglobin: 13.6 g/dL (ref 12.0–15.0)
Immature Granulocytes: 0 %
Lymphocytes Relative: 29 %
Lymphs Abs: 1.9 10*3/uL (ref 0.7–4.0)
MCH: 31.2 pg (ref 26.0–34.0)
MCHC: 33.2 g/dL (ref 30.0–36.0)
MCV: 94 fL (ref 80.0–100.0)
Monocytes Absolute: 0.6 10*3/uL (ref 0.1–1.0)
Monocytes Relative: 9 %
Neutro Abs: 3.8 10*3/uL (ref 1.7–7.7)
Neutrophils Relative %: 60 %
Platelet Count: 216 10*3/uL (ref 150–400)
RBC: 4.36 MIL/uL (ref 3.87–5.11)
RDW: 12.9 % (ref 11.5–15.5)
WBC Count: 6.4 10*3/uL (ref 4.0–10.5)
nRBC: 0 % (ref 0.0–0.2)

## 2019-05-21 LAB — LIPID PANEL
Cholesterol: 212 mg/dL — ABNORMAL HIGH (ref 0–200)
HDL: 91 mg/dL (ref >40)
LDL Cholesterol: 109 mg/dL — ABNORMAL HIGH (ref 0–99)
Total CHOL/HDL Ratio: 2.3 RATIO
Triglycerides: 59 mg/dL (ref <150)
VLDL: 12 mg/dL (ref 0–40)

## 2019-05-21 LAB — CMP (CANCER CENTER ONLY)
ALT: 28 U/L (ref 0–44)
AST: 29 U/L (ref 15–41)
Albumin: 3.9 g/dL (ref 3.5–5.0)
Alkaline Phosphatase: 56 U/L (ref 38–126)
Anion gap: 9 (ref 5–15)
BUN: 16 mg/dL (ref 8–23)
CO2: 26 mmol/L (ref 22–32)
Calcium: 9 mg/dL (ref 8.9–10.3)
Chloride: 103 mmol/L (ref 98–111)
Creatinine: 0.76 mg/dL (ref 0.44–1.00)
GFR, Est AFR Am: >60 mL/min (ref >60)
GFR, Estimated: >60 mL/min (ref >60)
Glucose, Bld: 87 mg/dL (ref 70–99)
Potassium: 3.6 mmol/L (ref 3.5–5.1)
Sodium: 138 mmol/L (ref 135–145)
Total Bilirubin: 0.4 mg/dL (ref 0.3–1.2)
Total Protein: 7.1 g/dL (ref 6.5–8.1)

## 2019-05-21 LAB — FERRITIN: Ferritin: 57 ng/mL (ref 11–307)

## 2019-05-21 LAB — VITAMIN D 25 HYDROXY (VIT D DEFICIENCY, FRACTURES): Vit D, 25-Hydroxy: 38.41 ng/mL (ref 30–100)

## 2019-05-21 MED ORDER — ZOLPIDEM TARTRATE 5 MG PO TABS: ORAL_TABLET | ORAL | 0 refills | Status: DC

## 2019-05-21 MED ORDER — GABAPENTIN 100 MG PO CAPS: 100.0000 mg | ORAL_CAPSULE | Freq: Every day | ORAL | 4 refills | Status: DC

## 2019-05-22 LAB — HEMOGLOBIN A1C
Hgb A1c MFr Bld: 5.6 % (ref 4.8–5.6)
Mean Plasma Glucose: 114 mg/dL

## 2019-05-31 ENCOUNTER — Other Ambulatory Visit: Payer: Self-pay | Admitting: *Deleted

## 2019-05-31 DIAGNOSIS — C50411 Malignant neoplasm of upper-outer quadrant of right female breast: Secondary | ICD-10-CM

## 2019-05-31 DIAGNOSIS — Z170 Estrogen receptor positive status [ER+]: Secondary | ICD-10-CM

## 2019-05-31 DIAGNOSIS — I998 Other disorder of circulatory system: Secondary | ICD-10-CM

## 2019-05-31 DIAGNOSIS — M7989 Other specified soft tissue disorders: Secondary | ICD-10-CM

## 2019-05-31 DIAGNOSIS — M79662 Pain in left lower leg: Secondary | ICD-10-CM

## 2019-05-31 NOTE — Telephone Encounter (Signed)
Pt left VM stating need for referral to the lymphedema clinic to obtain appropriate measurements for compression stockings for her lower extremity swelling.  She states she is having a hard time getting proper measurements.  Order entered.

## 2019-06-01 ENCOUNTER — Other Ambulatory Visit: Payer: Self-pay

## 2019-06-01 ENCOUNTER — Ambulatory Visit: Payer: Medicare Other | Attending: Oncology

## 2019-06-01 DIAGNOSIS — Z170 Estrogen receptor positive status [ER+]: Secondary | ICD-10-CM | POA: Diagnosis not present

## 2019-06-01 DIAGNOSIS — C50411 Malignant neoplasm of upper-outer quadrant of right female breast: Secondary | ICD-10-CM | POA: Insufficient documentation

## 2019-06-01 DIAGNOSIS — I890 Lymphedema, not elsewhere classified: Secondary | ICD-10-CM | POA: Insufficient documentation

## 2019-06-01 NOTE — Patient Instructions (Signed)
Deep breathing throughout the day 3-5x take 5 deep breathes in and out  Breathe in and stomach should expand, breathe out stomach should push in.   Elevate your legs whenever you are sitting longer than 20 minutes  Massage the lymph nodes on the inside of your upper thigh and behind your knees 10x,  3x/day.   [email or web address]

## 2019-06-01 NOTE — Therapy (Signed)
[place], [place], [address] Phone: [phone]   Fax:  [phone]  Physical Therapy Evaluation  Patient Details  Name: Kirsten Gibson MRN: [id number] Date of Birth: 1945-08-17 Referring Provider (PT): Lurline Del MD   Encounter Date: 06/01/2019  PT End of Session - 06/01/19 1224    Visit Number  1    Number of Visits  7    Date for PT Re-Evaluation  06/29/19    PT Start Time  0810    PT Stop Time  0900    PT Time Calculation (min)  50 min    Activity Tolerance  Patient tolerated treatment well    Behavior During Therapy  [facility] for tasks assessed/performed       Past Medical History:  Diagnosis Date  . Anemia due to chemotherapy 08/23/2011  . Anxiety   . Breast cancer ([place])    bilateral infiltrating ductal carcinoma  . Hx of radiation therapy 01/11/12- 02/28/12   right chest wall/supraclav fossa/drain site/scar boost  . Mycosis fungoides    follows up with [facility] once to twice per year   . Rosacea   . S/P chemotherapy, time since 4-12 weeks   . TMJ click    improved after changing the way dental exams were done  . Urinary frequency     Past Surgical History:  Procedure Laterality Date  . ABDOMINAL HYSTERECTOMY  1988   partial with on ovary remaining.  . benign tumor removed from neck  1974  . BREAST BIOPSY  05/19/11   left  and right breast  . BREAST EXCISIONAL BIOPSY  1991   benign  . CHOLECYSTECTOMY    . MASTECTOMY  11/2011   bilat, [place] Left 04/06/2012   Procedure: MINOR REMOVAL PORT-A-CATH;  Surgeon: [name], MD;  Location: [facility] [place];  Service: General;  Laterality: Left;  . PORTACATH PLACEMENT  05/21/2011   Procedure: INSERTION PORT-A-CATH;  Surgeon: [name], MD;  Location: [facility];  Service: General;  Laterality: Left;    There were no vitals filed for this visit.   Subjective Assessment - 06/01/19  0808    Subjective  Pt states that the swelling started in her legs about 4 years ago. She has been to the vascular doctor and had no significant findings as far as pt is aware. Pt states that she likes to walk fast and has lost some weight and now needs to gain weight. Pt states that at the end of the day her legs feel heavy and tired.    Pertinent History  History of Bil masectomy with 19 lymph nodes removed    Patient Stated Goals  I want to get the swelling down.    Currently in Pain?  No/denies    Multiple Pain Sites  No         [facility] PT Assessment - 06/01/19 0001      Assessment   Medical Diagnosis  Bil LE lymphedema     Referring Provider (PT)  Lurline Del MD    Hand Dominance  Right    Prior Therapy  Not for this condition      Precautions   Precautions  Other (comment)    Precaution Comments  Hx bil masectomy with 19 lymph node removal       Restrictions   Weight Bearing Restrictions  No      Balance Screen   Has the patient  fallen in the past 6 months  No    Has the patient had a decrease in activity level because of a fear of falling?   Yes    Is the patient reluctant to leave their home because of a fear of falling?   No      Home Environment   Living Environment  Private residence    Living Arrangements  Spouse/significant other    Type of [place]      Prior Function   Level of Independence  Independent      Cognition   Overall Cognitive Status  Within Functional Limits for tasks assessed      Observation/Other Assessments   Observations  pink discoloration in the lower L leg       Posture/Postural Control   Posture/Postural Control  Postural limitations    Postural Limitations  Rounded Shoulders;Forward head        LYMPHEDEMA/ONCOLOGY QUESTIONNAIRE - 06/01/19 0833      Type   Cancer Type  R/L breast cancer      Surgeries   Mastectomy Date  11/09/11    Sentinel Lymph Node Biopsy Date  11/09/11    Axillary Lymph Node Dissection Date   11/09/11      Treatment   Active Chemotherapy Treatment  No    Past Chemotherapy Treatment  Yes    Active Radiation Treatment  No    Past Radiation Treatment  Yes    Current Hormone Treatment  Yes    Drug Name  tamoxifen       What other symptoms do you have   Are you Having Heaviness or Tightness  Yes    Are you having Pain  Yes    Are you having pitting edema  No    Is it Hard or Difficult finding clothes that fit  No    Do you have infections  No    Is there Decreased scar mobility  No      Lymphedema Stage   Stage  STAGE 1 SPONTANEOUSLY REVERSIBLE      Lymphedema Assessments   Lymphedema Assessments  Lower extremities      Right Lower Extremity Lymphedema   20 cm Proximal to Suprapatella  45.3 cm    10 cm Proximal to Suprapatella  35.5 cm    At Midpatella/Popliteal Crease  30.9 cm    30 cm Proximal to Floor at Lateral Plantar Foot  30.3 cm    20 cm Proximal to Floor at Lateral Plantar Foot  25.2 1    10  cm Proximal to Floor at Lateral Malleoli  19 cm    5 cm Proximal to 1st MTP Joint  18.8 cm    Across MTP Joint  19.7 cm    Around Proximal Great Toe  5.9 cm      Left Lower Extremity Lymphedema   20 cm Proximal to Suprapatella  43.7 cm    10 cm Proximal to Suprapatella  34.7 cm    At Midpatella/Popliteal Crease  30.1 cm    30 cm Proximal to Floor at Lateral Plantar Foot  30.8 cm    20 cm Proximal to Floor at Lateral Plantar Foot  27.3 cm    10 cm Proximal to Floor at Lateral Malleoli  19.8 cm    5 cm Proximal to 1st MTP Joint  18.8 cm    Across MTP Joint  18.8 cm    Around Proximal Great Toe  6 cm  [facility]patient [facility]  Lymphedema Life Impact Scale Total Score  13.24 %      Objective measurements completed on examination: See above findings.      [facility] Adult PT Treatment/Exercise - 06/01/19 0001      Manual Therapy   Manual Therapy  Edema management    Edema Management   diaphragmatic breathing, education on leg elevation, inguinal and popliteal node stimulation with demonstration and return demonstration.              PT Education - 06/01/19 1222    Education Details  Educated extensively on the anatomy/physiology of the lymphatic system. Pt was educated on the different types of lymphedema and how to manage lymphedema in the legs. Discussed precautions due to previous masectomy with 19 lymph node removal including fluid will not be directed toward the axillary nodes. Discussed diaphragmatic breathing, leg elevation and node stimluation at the inguinal/popliteal nodes in order to promote fluid flow out of the legs. She was educated on the use of compression and risk reduction precautions including decreasing hot shower time to 15 min, and wearing compression when working out. Discussesd POC with pt including learning how to self wrap and reducing slightly before getting fit for a garment.    Person(s) Educated  Patient    Methods  Explanation;Demonstration;Handout;Verbal cues    Comprehension  Verbalized understanding;Returned demonstration       PT Short Term Goals - 06/01/19 1229      PT SHORT TERM GOAL #1   Title  Pt will be independent in self wrapping.    Baseline  pt does not know how to self wrap    Time  1    Period  Weeks    Status  New    Target Date  06/15/19        PT Long Term Goals - 06/01/19 1229      PT LONG TERM GOAL #1   Title  Pt will have 2 cm reduction in her L lower leg circumferential measurements in order to demonstrate decreased fluid in her L lower leg.    Baseline  see measurements.    Time  3    Period  Weeks    Status  New    Target Date  06/29/19      PT LONG TERM GOAL #2   Title  Pt will be measured for and fitted with a garment to wear on a daily basis on bil lower legs in order to manage lymphedema at home.    Baseline  pt currently does not have garments.    Time  3    Period  Weeks    Status  New     Target Date  06/29/19      PT LONG TERM GOAL #3   Title  Pt will report 50% improvement in pain/aching and decreased edema in the Bil lower extremities in order to demonstrate a subjective improvement in quality of life with Lymphedema.    Baseline  pt states she has significant aching by the end of the day.    Time  3    Period  Weeks    Status  New    Target Date  06/29/19      PT LONG TERM GOAL #4   Title  Pt will be able to state lymphedema management and reduction practices including elevation, compression and diaphragmatic breathing in order to demonstrate autonomy with home management of lymphedema.  Baseline  pt was just taught concepts today.    Time  3    Period  Weeks    Status  New    Target Date  06/29/19             Plan - 06/01/19 1225    Clinical Impression Statement  Pt presents to physical therapy with soft edema in her BLE with the L > R. She reports aching in her lower extremities that gets worse as the day progresses and is improved when she wakes up in the morning indicating stage 1 spontaneously reversible lymphedema most likely related to venous issues. Extra time spent today educating pt on the lymphatic system and her precuations due to previous masectomy with lymph node removal. Discussed POC including learning how to self wrap and then returning to physical therapy to get fitted for garment when she is slightly smaller on the LLE. Pt will benefit from skilled physical therapy services to address the above limitations to decrease risk for infection and immobility.    Personal Factors and Comorbidities  Comorbidity 1    Comorbidities  hx masectomy with 19 lymph node removal    Stability/Clinical Decision Making  Stable/Uncomplicated    Clinical Decision Making  Low    Rehab Potential  Good    PT Frequency  2x / week   1-2x/week to learn self wrapping   PT Duration  3 weeks    PT Treatment/Interventions  Neuromuscular re-education;Therapeutic  exercise;Therapeutic activities;Manual techniques    PT Next Visit Plan  begin teaching self wrapping and teach MLD only for the leg in conjunction with diaphramagtic breathing do not teach trunk. Sign up for time with Melissa.    PT Home Exercise Plan  see pt instructions.    Recommended Other Services  sign up for time with Melissa.    Consulted and Agree with Plan of Care  Patient       Patient will benefit from skilled therapeutic intervention in order to improve the following deficits and impairments:  Increased edema, Pain  Visit Diagnosis: Malignant neoplasm of upper-outer quadrant of right breast in female, estrogen receptor positive ([place])  Lymphedema, not elsewhere classified     Problem List Patient Active Problem List   Diagnosis Date Noted  . Vascular insufficiency of extremity 05/21/2019  . Lung nodule seen on imaging study 05/24/2016  . OAB (overactive bladder) 01/21/2016  . Vaginal atrophy 01/21/2016  . White coat syndrome without hypertension 06/26/2012  . Pain of left calf 06/26/2012  . TMJ click   . S/P chemotherapy, time since 4-12 weeks   . Rosacea   . History of external beam radiation therapy   . Red blood cell antibody positive, compatible PRBC difficult to obtain 12/29/2011  . Neoplastic disease 09/28/2011  . Malignant neoplasm of upper-outer quadrant of right breast in female, estrogen receptor positive ([place]) 05/19/2011  . Primary cancer of upper outer quadrant of right female breast ([place]) 05/06/2011    Ander Purpura, PT 06/01/2019, 12:33 PM  [place], [place], [address] Phone: [phone]   Fax:  [phone]  Name: Kirsten Gibson MRN: [id number] Date of Birth: 1945/08/31

## 2019-06-11 ENCOUNTER — Other Ambulatory Visit: Payer: Self-pay

## 2019-06-11 ENCOUNTER — Ambulatory Visit: Payer: Medicare Other | Attending: Oncology

## 2019-06-11 DIAGNOSIS — Z170 Estrogen receptor positive status [ER+]: Secondary | ICD-10-CM | POA: Diagnosis not present

## 2019-06-11 DIAGNOSIS — C50411 Malignant neoplasm of upper-outer quadrant of right female breast: Secondary | ICD-10-CM | POA: Diagnosis not present

## 2019-06-11 DIAGNOSIS — I890 Lymphedema, not elsewhere classified: Secondary | ICD-10-CM | POA: Diagnosis not present

## 2019-06-11 NOTE — Therapy (Signed)
[place], [place], [address] Phone: [phone]   Fax:  [phone]  Physical Therapy Treatment  Patient Details  Name: Kirsten Gibson MRN: [id number] Date of Birth: 1946/02/09 Referring Provider (PT): Lurline Del MD   Encounter Date: 06/11/2019  PT End of Session - 06/11/19 1406    Visit Number  2    Number of Visits  7    Date for PT Re-Evaluation  06/29/19    PT Start Time  [id number]    PT Stop Time  1458    PT Time Calculation (min)  53 min    Activity Tolerance  Patient tolerated treatment well    Behavior During Therapy  [facility] for tasks assessed/performed       Past Medical History:  Diagnosis Date  . Anemia due to chemotherapy 08/23/2011  . Anxiety   . Breast cancer ([place])    bilateral infiltrating ductal carcinoma  . Hx of radiation therapy 01/11/12- 02/28/12   right chest wall/supraclav fossa/drain site/scar boost  . Mycosis fungoides    follows up with [facility] once to twice per year   . Rosacea   . S/P chemotherapy, time since 4-12 weeks   . TMJ click    improved after changing the way dental exams were done  . Urinary frequency     Past Surgical History:  Procedure Laterality Date  . ABDOMINAL HYSTERECTOMY  1988   partial with on ovary remaining.  . benign tumor removed from neck  1974  . BREAST BIOPSY  05/19/11   left  and right breast  . BREAST EXCISIONAL BIOPSY  1991   benign  . CHOLECYSTECTOMY    . MASTECTOMY  11/2011   bilat, [place] Left 04/06/2012   Procedure: MINOR REMOVAL PORT-A-CATH;  Surgeon: [name], MD;  Location: [facility];  Service: General;  Laterality: Left;  . PORTACATH PLACEMENT  05/21/2011   Procedure: INSERTION PORT-A-CATH;  Surgeon: [name], MD;  [facility];  Service: General;  Laterality: Left;    There were no vitals filed for this visit.  Subjective Assessment - 06/11/19 1406     Subjective  Pt reports no pain today. She states that everything is about the same from her initial evaluation.    Pertinent History  History of Bil masectomy with 19 lymph nodes removed    Patient Stated Goals  I want to get the swelling down.    Currently in Pain?  No/denies    Multiple Pain Sites  No                  [facility]patient [facility]  Lymphedema Life Impact Scale Total Score  13.24 %           [facility] Adult PT Treatment/Exercise - 06/11/19 0001      Posture/Postural Control   Posture/Postural Control  Postural limitations      Manual Therapy   Manual Therapy  Edema management;Compression Bandaging    Edema Management  Pt was educated on how to perform compression wrap of the L lower leg with demonstration followed by return demonstration using 2 short stretch bandages 1 artiflex and TG soft small for base layer.              PT Education - 06/11/19 1729    Education Details  Pt was educated on how to don layered compression wrap to  the L lower leg. She was educated throughout on tension, direction and layers. She was provided with handout to practice at home.    Person(s) Educated  Patient    Methods  Explanation;Demonstration;Verbal cues;Handout    Comprehension  Verbalized understanding;Returned demonstration       PT Short Term Goals - 06/01/19 1229      PT SHORT TERM GOAL #1   Title  Pt will be independent in self wrapping.    Baseline  pt does not know how to self wrap    Time  1    Period  Weeks    Status  New    Target Date  06/15/19        PT Long Term Goals - 06/01/19 1229      PT LONG TERM GOAL #1   Title  Pt will have 2 cm reduction in her L lower leg circumferential measurements in order to demonstrate decreased fluid in her L lower leg.    Baseline  see measurements.    Time  3    Period  Weeks    Status  New    Target Date  06/29/19      PT LONG TERM GOAL #2    Title  Pt will be measured for and fitted with a garment to wear on a daily basis on bil lower legs in order to manage lymphedema at home.    Baseline  pt currently does not have garments.    Time  3    Period  Weeks    Status  New    Target Date  06/29/19      PT LONG TERM GOAL #3   Title  Pt will report 50% improvement in pain/aching and decreased edema in the Bil lower extremities in order to demonstrate a subjective improvement in quality of life with Lymphedema.    Baseline  pt states she has significant aching by the end of the day.    Time  3    Period  Weeks    Status  New    Target Date  06/29/19      PT LONG TERM GOAL #4   Title  Pt will be able to state lymphedema management and reduction practices including elevation, compression and diaphragmatic breathing in order to demonstrate autonomy with home management of lymphedema.    Baseline  pt was just taught concepts today.    Time  3    Period  Weeks    Status  New    Target Date  06/29/19            Plan - 06/11/19 1405    Clinical Impression Statement  Pt presents to physical therapy with continued edema in her Bil LE. She brought an imaging report from 2018 today that stated she did not have a DVT, which pt was hoping had mentioned something about venous return. She was taught how to peform compression wrap of the lower extremity this session and for the sake of learning and accuracy pt will just focus on wrapping the L lower leg until she returns to physical therapy. She was educated that she needs to wear the wrap as much as possible even at night. Wrap was removed today and physical therapist did not re-wrap after practice and education due to pt shoe did not fit over the wrap and it was raining outside. Pt will benefit from continued POC at this time.    Personal Factors and Comorbidities  Comorbidity 1    Comorbidities  hx masectomy with 19 lymph node removal    Rehab Potential  Good    PT Frequency  2x / week     PT Duration  3 weeks    PT Treatment/Interventions  Neuromuscular re-education;Therapeutic exercise;Therapeutic activities;Manual techniques    PT Next Visit Plan  begin teaching self wrapping and teach MLD only for the leg in conjunction with diaphramagtic breathing do not teach trunk. Sign up for time with Melissa.    PT Home Exercise Plan  see pt instructions.    Consulted and Agree with Plan of Care  Patient       Patient will benefit from skilled therapeutic intervention in order to improve the following deficits and impairments:  Increased edema, Pain  Visit Diagnosis: Malignant neoplasm of upper-outer quadrant of right breast in female, estrogen receptor positive ([place])  Lymphedema, not elsewhere classified     Problem List Patient Active Problem List   Diagnosis Date Noted  . Vascular insufficiency of extremity 05/21/2019  . Lung nodule seen on imaging study 05/24/2016  . OAB (overactive bladder) 01/21/2016  . Vaginal atrophy 01/21/2016  . White coat syndrome without hypertension 06/26/2012  . Pain of left calf 06/26/2012  . TMJ click   . S/P chemotherapy, time since 4-12 weeks   . Rosacea   . History of external beam radiation therapy   . Red blood cell antibody positive, compatible PRBC difficult to obtain 12/29/2011  . Neoplastic disease 09/28/2011  . Malignant neoplasm of upper-outer quadrant of right breast in female, estrogen receptor positive ([place]) 05/19/2011  . Primary cancer of upper outer quadrant of right female breast ([place]) 05/06/2011    Ander Purpura, PT 06/11/2019, 5:34 PM  [place], [place], [address] Phone: [phone]   Fax:  [phone]  Name: Kirsten Gibson MRN: [id number] Date of Birth: 01-24-1946

## 2019-06-13 ENCOUNTER — Ambulatory Visit: Payer: Medicare Other

## 2019-06-13 ENCOUNTER — Other Ambulatory Visit: Payer: Self-pay

## 2019-06-13 DIAGNOSIS — I890 Lymphedema, not elsewhere classified: Secondary | ICD-10-CM

## 2019-06-13 DIAGNOSIS — Z170 Estrogen receptor positive status [ER+]: Secondary | ICD-10-CM

## 2019-06-13 DIAGNOSIS — C50411 Malignant neoplasm of upper-outer quadrant of right female breast: Secondary | ICD-10-CM | POA: Diagnosis not present

## 2019-06-13 NOTE — Patient Instructions (Signed)
Deep Effective Breath   Standing, sitting, or laying down place both hands on the belly. Take a deep breath IN, expanding the belly; then breath OUT, contracting the belly. Repeat __5__ times. Do __2-3__ sessions per day and before each self massage.  http://gt2.exer.us/866   Copyright  VHI. All rights reserved.   LEG: Knee  Flat fingers behind knee IF ABLE and make in-place circles. Do _10_ times of each sequence.  Do _1__ time per day.  Copyright  VHI. All rights reserved.  LEG: Ankle to Hip Sweep   Hands on sides of ankle of involved leg, pump _5__ times up both sides of lower leg (stretch the skin let it relax, stretch the skin let it relax, repeat) Make tiny overlapping stretches. Just get the skin stretch and then relax.  Do __1_ time per day.  Copyright  VHI. All rights reserved.  FOOT: Dorsum of Foot   One hand on top of foot make _5_ stationary circles or pumps. Then retrace all steps pumping back up both sides of lower leg with hands on both sides of one leg then do the other 3x, re-work the lymph nodes behind the knee and at the groin 5x. Do 5 more deep breathes.   Do _1__ time per day.  Copyright  VHI. All rights reserved.

## 2019-06-13 NOTE — Therapy (Signed)
[place], [place], [address] Phone: [phone]   Fax:  [phone]  Physical Therapy Treatment  Patient Details  Name: Kirsten Gibson MRN: [id number] Date of Birth: 19-Aug-1945 Referring Provider (PT): Lurline Del MD   Encounter Date: 06/13/2019  PT End of Session - 06/13/19 1502    Visit Number  3    Number of Visits  7    Date for PT Re-Evaluation  06/29/19    PT Start Time  1500    PT Stop Time  1608    PT Time Calculation (min)  68 min    Activity Tolerance  Patient tolerated treatment well    Behavior During Therapy  [facility] for tasks assessed/performed       Past Medical History:  Diagnosis Date  . Anemia due to chemotherapy 08/23/2011  . Anxiety   . Breast cancer ([place])    bilateral infiltrating ductal carcinoma  . Hx of radiation therapy 01/11/12- 02/28/12   right chest wall/supraclav fossa/drain site/scar boost  . Mycosis fungoides    follows up with [facility] once to twice per year   . Rosacea   . S/P chemotherapy, time since 4-12 weeks   . TMJ click    improved after changing the way dental exams were done  . Urinary frequency     Past Surgical History:  Procedure Laterality Date  . ABDOMINAL HYSTERECTOMY  1988   partial with on ovary remaining.  . benign tumor removed from neck  1974  . BREAST BIOPSY  05/19/11   left  and right breast  . BREAST EXCISIONAL BIOPSY  1991   benign  . CHOLECYSTECTOMY    . MASTECTOMY  11/2011   bilat, [place] Left 04/06/2012   Procedure: MINOR REMOVAL PORT-A-CATH;  Surgeon: [name], MD;  Location: [place];  Service: General;  Laterality: Left;  . PORTACATH PLACEMENT  05/21/2011   Procedure: INSERTION PORT-A-CATH;  Surgeon: [name], MD;  Location: [facility];  Service: General;  Laterality: Left;    There were no vitals filed for this visit.  Subjective Assessment - 06/13/19 1502     Subjective  Pt reports that the first night that she wrapped her leg she noticed a cramp in her calf at night so she removed the wrap.    Pertinent History  History of Bil masectomy with 19 lymph nodes removed    Patient Stated Goals  I want to get the swelling down.    Currently in Pain?  No/denies                  Outpatient Re[facility]  Lymphedema Life Impact Scale Total Score  13.24 %           [facility] Adult PT Treatment/Exercise - 06/13/19 0001      Manual Therapy   Manual Therapy  Compression Bandaging;Edema management;Manual Lymphatic Drainage (MLD)    Edema Management  Continued education for self wrapping this session pt had demonstration with wrapping of the L lower leg and then pt performed compression wrapping for the R lower leg with physical therapist supervision and instruction throughout.     Manual Lymphatic Drainage (MLD)  In sitting pt was taught how to perform modified MLD of the bil LE: diaphragmatic breathing 5 breathes, work inguinal then popliteal nodes,     Compression Bandaging  TG soft for base layer, 1  artiflex and 2 short stretch bandages from foot to below the knee. Toes were not wrapped due to previous wrap did not cover the toes and pt had no increase in toe edema.              PT Education - 06/13/19 1507    Education Details  Pt was educated that she did the correct thing removing the wrap when she felt cramping at night. Educated that she most likely has circulation issues that possibly caused that and was provided with exercises for synergist and antagonist for ankle PF to help fluid flow in this area. Also Hamstring stretch added to help decreased tightness in this area. Pt was educated on modified self MLD and continued education on wrapping    Person(s) Educated  Patient    Methods  Explanation;Demonstration;Verbal cues;Handout    Comprehension  Verbalized  understanding;Returned demonstration       PT Short Term Goals - 06/01/19 1229      PT SHORT TERM GOAL #1   Title  Pt will be independent in self wrapping.    Baseline  pt does not know how to self wrap    Time  1    Period  Weeks    Status  New    Target Date  06/15/19        PT Long Term Goals - 06/01/19 1229      PT LONG TERM GOAL #1   Title  Pt will have 2 cm reduction in her L lower leg circumferential measurements in order to demonstrate decreased fluid in her L lower leg.    Baseline  see measurements.    Time  3    Period  Weeks    Status  New    Target Date  06/29/19      PT LONG TERM GOAL #2   Title  Pt will be measured for and fitted with a garment to wear on a daily basis on bil lower legs in order to manage lymphedema at home.    Baseline  pt currently does not have garments.    Time  3    Period  Weeks    Status  New    Target Date  06/29/19      PT LONG TERM GOAL #3   Title  Pt will report 50% improvement in pain/aching and decreased edema in the Bil lower extremities in order to demonstrate a subjective improvement in quality of life with Lymphedema.    Baseline  pt states she has significant aching by the end of the day.    Time  3    Period  Weeks    Status  New    Target Date  06/29/19      PT LONG TERM GOAL #4   Title  Pt will be able to state lymphedema management and reduction practices including elevation, compression and diaphragmatic breathing in order to demonstrate autonomy with home management of lymphedema.    Baseline  pt was just taught concepts today.    Time  3    Period  Weeks    Status  New    Target Date  06/29/19            Plan - 06/13/19 1501    Clinical Impression Statement  Pt was educated on modified MLD to avoid pushing fluid toward the axilla due to preivous masectomy just to the knee today to see if this helps relieve edema in the area.  Spend extra time on dipahragmatic breathing due to pt is a thoracic breather  and tends to pull air into the ribs utilizing accessory musculature rather than pulling aire in using the diaphragm. Practiced wrapping the bil lower legs with demonstration on the L lower leg and patient wrapped the R lower leg for compression bandaging. Pt was provided with yellow theraband and [employer] videos with print out for exercises/stretching for the lower legs to help improve blood flow. Pt will benefit from continued POC at this time.    Personal Factors and Comorbidities  Comorbidity 1    Comorbidities  hx masectomy with 19 lymph node removal    Rehab Potential  Good    PT Frequency  2x / week    PT Duration  3 weeks    PT Treatment/Interventions  Neuromuscular re-education;Therapeutic exercise;Therapeutic activities;Manual techniques    PT Next Visit Plan  continue with self wrapping and MLD only for the leg in conjunction with diaphramagtic breathing do not teach trunk MLD due to masectomy . Sign up for time with Melissa.    PT Home Exercise Plan  see pt instructions.    Consulted and Agree with Plan of Care  Patient       Patient will benefit from skilled therapeutic intervention in order to improve the following deficits and impairments:  Increased edema, Pain  Visit Diagnosis: Malignant neoplasm of upper-outer quadrant of right breast in female, estrogen receptor positive ([place])  Lymphedema, not elsewhere classified     Problem List Patient Active Problem List   Diagnosis Date Noted  . Vascular insufficiency of extremity 05/21/2019  . Lung nodule seen on imaging study 05/24/2016  . OAB (overactive bladder) 01/21/2016  . Vaginal atrophy 01/21/2016  . White coat syndrome without hypertension 06/26/2012  . Pain of left calf 06/26/2012  . TMJ click   . S/P chemotherapy, time since 4-12 weeks   . Rosacea   . History of external beam radiation therapy   . Red blood cell antibody positive, compatible PRBC difficult to obtain 12/29/2011  . Neoplastic disease 09/28/2011   . Malignant neoplasm of upper-outer quadrant of right breast in female, estrogen receptor positive ([place]) 05/19/2011  . Primary cancer of upper outer quadrant of right female breast ([place]) 05/06/2011    Ander Purpura, PT 06/13/2019, 5:24 PM  [place], [place], [address] Phone: [phone]   Fax:  [phone]  Name: Kirsten Gibson MRN: [id number] Date of Birth: 1945-04-27

## 2019-06-18 DIAGNOSIS — Z0000 Encounter for general adult medical examination without abnormal findings: Secondary | ICD-10-CM | POA: Diagnosis not present

## 2019-06-19 DIAGNOSIS — L719 Rosacea, unspecified: Secondary | ICD-10-CM | POA: Diagnosis not present

## 2019-06-19 DIAGNOSIS — L578 Other skin changes due to chronic exposure to nonionizing radiation: Secondary | ICD-10-CM | POA: Diagnosis not present

## 2019-06-19 DIAGNOSIS — L820 Inflamed seborrheic keratosis: Secondary | ICD-10-CM | POA: Diagnosis not present

## 2019-06-19 DIAGNOSIS — L821 Other seborrheic keratosis: Secondary | ICD-10-CM | POA: Diagnosis not present

## 2019-06-19 DIAGNOSIS — Z808 Family history of malignant neoplasm of other organs or systems: Secondary | ICD-10-CM | POA: Diagnosis not present

## 2019-06-19 DIAGNOSIS — I872 Venous insufficiency (chronic) (peripheral): Secondary | ICD-10-CM | POA: Diagnosis not present

## 2019-06-19 DIAGNOSIS — L814 Other melanin hyperpigmentation: Secondary | ICD-10-CM | POA: Diagnosis not present

## 2019-06-19 DIAGNOSIS — D225 Melanocytic nevi of trunk: Secondary | ICD-10-CM | POA: Diagnosis not present

## 2019-06-22 ENCOUNTER — Ambulatory Visit: Payer: Medicare Other

## 2019-06-22 ENCOUNTER — Other Ambulatory Visit: Payer: Self-pay

## 2019-06-22 DIAGNOSIS — Z170 Estrogen receptor positive status [ER+]: Secondary | ICD-10-CM

## 2019-06-22 DIAGNOSIS — I890 Lymphedema, not elsewhere classified: Secondary | ICD-10-CM

## 2019-06-22 DIAGNOSIS — C50411 Malignant neoplasm of upper-outer quadrant of right female breast: Secondary | ICD-10-CM | POA: Diagnosis not present

## 2019-06-22 NOTE — Patient Instructions (Signed)
Access Code: [email or web address] URL: [email or web address] Date: 06/22/2019 Prepared by: Tomma Rakers  Exercises Supine Core Bicycle - 1 x daily - 7 x weekly - 10 reps - 3 sets Long Sitting Ankle Pumps - 1 x daily - 7 x weekly - 2 sets - 10 reps - do this laying on your back after you do the "pedal pushers" hold Bilateral Long Arc Quad - 1 x daily - 7 x weekly - 2 sets - 10 reps - 3 do this laying on your back after you do the "pedal pushers" hold Seated Ankle Alphabet - 1 x daily - 7 x weekly - 3 sets - 10 reps - you can do this on your back after doing the "pedal pushers" or you can do sitting. hold

## 2019-06-22 NOTE — Therapy (Signed)
[place], [place], [address] Phone: [phone]   Fax:  [phone]  Physical Therapy Treatment  Patient Details  Name: Kirsten Gibson MRN: [id number] Date of Birth: [birth year] Referring Provider (PT): Lurline Del MD   Encounter Date: 06/22/2019  PT End of Session - 06/22/19 1101    Visit Number  4    Number of Visits  7    Date for PT Re-Evaluation  06/29/19    PT Start Time  1100    PT Stop Time  1210    PT Time Calculation (min)  70 min    Activity Tolerance  Patient tolerated treatment well    Behavior During Therapy  [facility] for tasks assessed/performed       Past Medical History:  Diagnosis Date  . Anemia due to chemotherapy 08/23/2011  . Anxiety   . Breast cancer ([place])    bilateral infiltrating ductal carcinoma  . Hx of radiation therapy 01/11/12- 02/28/12   right chest wall/supraclav fossa/drain site/scar boost  . Mycosis fungoides    follows up with [facility] once to twice per year   . Rosacea   . S/P chemotherapy, time since 4-12 weeks   . TMJ click    improved after changing the way dental exams were done  . Urinary frequency     Past Surgical History:  Procedure Laterality Date  . ABDOMINAL HYSTERECTOMY  1988   partial with on ovary remaining.  . benign tumor removed from neck  1974  . BREAST BIOPSY  05/19/11   left  and right breast  . BREAST EXCISIONAL BIOPSY  1991   benign  . CHOLECYSTECTOMY    . MASTECTOMY  11/2011   bilat, [place] Left 04/06/2012   Procedure: MINOR REMOVAL PORT-A-CATH;  Surgeon: [name], MD;  Location: [place];  Service: General;  Laterality: Left;  . PORTACATH PLACEMENT  05/21/2011   Procedure: INSERTION PORT-A-CATH;  Surgeon: [name], MD;  [facility];  Service: General;  Laterality: Left;    There were no vitals filed for this visit.  Subjective Assessment - 06/22/19 1101     Subjective  Pt states that her legs look great in the morning and are down with no swelling. As the day progresses her legs even in the wraps continue to swell and will be red when she removes the wrap.    Pertinent History  History of Bil masectomy with 19 lymph nodes removed    Patient Stated Goals  I want to get the swelling down.    Currently in Pain?  No/denies    Multiple Pain Sites  No            LYMPHEDEMA/ONCOLOGY QUESTIONNAIRE - 06/22/19 1122      Right Lower Extremity Lymphedema   30 cm Proximal to Floor at Lateral Plantar Foot  29 cm    20 cm Proximal to Floor at Lateral Plantar Foot  24.3 1    10  cm Proximal to Floor at Lateral Malleoli  17.5 cm    5 cm Proximal to 1st MTP Joint  17.3 cm    Across MTP Joint  18 cm    Around Proximal Great Toe  6 cm      Left Lower Extremity Lymphedema   At Midpatella/Popliteal Crease  30.1 cm    30 cm Proximal to Floor at Lateral Plantar Foot  30 cm    20  cm Proximal to Floor at Lateral Plantar Foot  24.5 cm    10 cm Proximal to Floor at Lateral Malleoli  18 cm    5 cm Proximal to 1st MTP Joint  17.7 cm    Across MTP Joint  17.3 cm    Around Proximal Great Toe  5.9 cm            Outpatient Rehab from [facility]  Lymphedema Life Impact Scale Total Score  13.24 %           [facility] Adult PT Treatment/Exercise - 06/22/19 0001      Manual Therapy   Manual Therapy  Edema management;Compression Bandaging    Edema Management  circumferential measurements were taken this session.     Compression Bandaging  TG soft for base layer, 1 artiflex and 2 short stretch bandages from foot to below the knee. Toes were not wrapped due to previous wrap did not cover the toes and pt had no increase in toe edema 1/4 inch gray foam trialed today to see if this doesn't help the painful area in lower legs by the end of the day.              PT Education - 06/22/19 1119    Education  Details  Access Code: [email or web address], educated pt on use of exercise to help pump fluid upward and out of the legs to improve fluid flow out of the legs. Discussed new exercises and then compression garments that will be appropriate.    Person(s) Educated  Patient    Methods  Explanation;Demonstration;Verbal cues;Handout    Comprehension  Verbalized understanding;Returned demonstration       PT Short Term Goals - 06/01/19 1229      PT SHORT TERM GOAL #1   Title  Pt will be independent in self wrapping.    Baseline  pt does not know how to self wrap    Time  1    Period  Weeks    Status  New    Target Date  06/15/19        PT Long Term Goals - 06/01/19 1229      PT LONG TERM GOAL #1   Title  Pt will have 2 cm reduction in her L lower leg circumferential measurements in order to demonstrate decreased fluid in her L lower leg.    Baseline  see measurements.    Time  3    Period  Weeks    Status  New    Target Date  06/29/19      PT LONG TERM GOAL #2   Title  Pt will be measured for and fitted with a garment to wear on a daily basis on bil lower legs in order to manage lymphedema at home.    Baseline  pt currently does not have garments.    Time  3    Period  Weeks    Status  New    Target Date  06/29/19      PT LONG TERM GOAL #3   Title  Pt will report 50% improvement in pain/aching and decreased edema in the Bil lower extremities in order to demonstrate a subjective improvement in quality of life with Lymphedema.    Baseline  pt states she has significant aching by the end of the day.    Time  3    Period  Weeks    Status  New    Target  Date  06/29/19      PT LONG TERM GOAL #4   Title  Pt will be able to state lymphedema management and reduction practices including elevation, compression and diaphragmatic breathing in order to demonstrate autonomy with home management of lymphedema.    Baseline  pt was just taught concepts today.    Time  3    Period  Weeks    Status  New     Target Date  06/29/19            Plan - 06/22/19 1101    Clinical Impression Statement  Pt HEP was updated this session due to concerns of it aggravating her plantar fascia which pt has had difficulty with in the past. She was given exercises in supine with legs in the area including ankle pumps, knee flexion/extension, and ankle alphabet in order to facilitate fluid flow out of the lower extremities. Discussed wrapping and level of compression with patient including that the level of compression needs to be tolerated but it may not be what is necessary to prevent edema which will then caused increased pain in the legs. Trialed 1/4 inch gray foam over the most painful area in the bil lower legs this session to see if this doesn't help decrease pain as well as thin stockinette instead of TG soft to see if this doesn't better facilitate fluid flow out of the leg. Throughout wrapping pt questions were answered and discussed rational. Pt was provided with measurement sheet for her bil lower legs due to pt states that her bil lower legs are much smaller when she first wakes up in the morning and this may be the ideal time to measure for her garment. Pt will benefit from continued POC at this time.    Personal Factors and Comorbidities  Comorbidity 1    Comorbidities  hx masectomy with 19 lymph node removal    Rehab Potential  Good    PT Frequency  2x / week    PT Duration  3 weeks    PT Treatment/Interventions  Neuromuscular re-education;Therapeutic exercise;Therapeutic activities;Manual techniques    PT Next Visit Plan  continue with self wrapping and MLD only for the leg in conjunction with diaphramagtic breathing do not teach trunk MLD due to masectomy . Sign up for time with Melissa.    PT Home Exercise Plan  see pt instructions.    Consulted and Agree with Plan of Care  Patient       Patient will benefit from skilled therapeutic intervention in order to improve the following deficits and  impairments:  Increased edema, Pain  Visit Diagnosis: Malignant neoplasm of upper-outer quadrant of right breast in female, estrogen receptor positive ([place])  Lymphedema, not elsewhere classified     Problem List Patient Active Problem List   Diagnosis Date Noted  . Vascular insufficiency of extremity 05/21/2019  . Lung nodule seen on imaging study 05/24/2016  . OAB (overactive bladder) 01/21/2016  . Vaginal atrophy 01/21/2016  . White coat syndrome without hypertension 06/26/2012  . Pain of left calf 06/26/2012  . TMJ click   . S/P chemotherapy, time since 4-12 weeks   . Rosacea   . History of external beam radiation therapy   . Red blood cell antibody positive, compatible PRBC difficult to obtain 12/29/2011  . Neoplastic disease 09/28/2011  . Malignant neoplasm of upper-outer quadrant of right breast in female, estrogen receptor positive ([place]) 05/19/2011  . Primary cancer of upper outer quadrant of right female  breast ([place]) 05/06/2011    Ander Purpura, PT 06/22/2019, 12:23 PM  [place], [place], [address] Phone: [phone]   Fax:  [phone]  Name: SHAKYA FEUER MRN: [id number] Date of Birth: [birth year]

## 2019-06-27 ENCOUNTER — Ambulatory Visit: Payer: Medicare Other

## 2019-06-27 ENCOUNTER — Other Ambulatory Visit: Payer: Self-pay

## 2019-06-27 DIAGNOSIS — Z170 Estrogen receptor positive status [ER+]: Secondary | ICD-10-CM | POA: Diagnosis not present

## 2019-06-27 DIAGNOSIS — C50411 Malignant neoplasm of upper-outer quadrant of right female breast: Secondary | ICD-10-CM

## 2019-06-27 DIAGNOSIS — I890 Lymphedema, not elsewhere classified: Secondary | ICD-10-CM | POA: Diagnosis not present

## 2019-06-27 NOTE — Patient Instructions (Signed)
Pt took measurements at home when she first woke up which did not differ much from the measurements that we took last session Base of the great toe: R 6.5, L 6.5 Metatarsal heads: R 19 L 19 5 cm from the base of the great toe: R 17.5, L 17.5 10 cm from the heel: R 18, L 18.5 20 cm from the heel: R 23.5, L 24 30 cm from the heel R 29 , L 29.5

## 2019-06-27 NOTE — Therapy (Signed)
[place], [place], [address] Phone: [phone]   Fax:  [phone]  Physical Therapy Treatment  Patient Details  Name: Kirsten Gibson MRN: [id number] Date of Birth: 02-09-1946 Referring Provider (PT): Lurline Del MD   Encounter Date: 06/27/2019  PT End of Session - 06/27/19 0923    Visit Number  5    Number of Visits  7    Date for PT Re-Evaluation  06/29/19    PT Start Time  0910    PT Stop Time  1003    PT Time Calculation (min)  53 min    Activity Tolerance  Patient tolerated treatment well    Behavior During Therapy  [facility] for tasks assessed/performed       Past Medical History:  Diagnosis Date  . Anemia due to chemotherapy 08/23/2011  . Anxiety   . Breast cancer ([place])    bilateral infiltrating ductal carcinoma  . Hx of radiation therapy 01/11/12- 02/28/12   right chest wall/supraclav fossa/drain site/scar boost  . Mycosis fungoides    follows up with [facility] once to twice per year   . Rosacea   . S/P chemotherapy, time since 4-12 weeks   . TMJ click    improved after changing the way dental exams were done  . Urinary frequency     Past Surgical History:  Procedure Laterality Date  . ABDOMINAL HYSTERECTOMY  1988   partial with on ovary remaining.  . benign tumor removed from neck  1974  . BREAST BIOPSY  05/19/11   left  and right breast  . BREAST EXCISIONAL BIOPSY  1991   benign  . CHOLECYSTECTOMY    . MASTECTOMY  11/2011   bilat, [place] Left 04/06/2012   Procedure: MINOR REMOVAL PORT-A-CATH;  Surgeon: [name], MD;  Location: [facility];  Service: General;  Laterality: Left;  . PORTACATH PLACEMENT  05/21/2011   Procedure: INSERTION PORT-A-CATH;  Surgeon: [name], MD;  Location: [facility];  Service: General;  Laterality: Left;    There were no vitals filed for this visit.  Subjective Assessment - 06/27/19 0923     Subjective  Pt states that the foam did not seem to help but her legs are less red and itchy when she removes the bandages.    Pertinent History  History of Bil masectomy with 19 lymph nodes removed    Patient Stated Goals  I want to get the swelling down.    Currently in Pain?  No/denies    Multiple Pain Sites  No                   Outpatient Rehab from 06/01/2019 in [place]  Lymphedema Life Impact Scale Total Score  13.24 %                   PT Education - 06/27/19 0925    Education Details  Extra time spent today discussing vascular MD with patient and how they are the ones who will be able to diagnose any type of vascular issue in the legs including CVI or PAD. She was educated on the differences and how sometimes it is possible to have both at the same time as well as what this means for compression within the physical therapy scope of practice. Pt was measured for and taught how to measure the length of her compression garment. Pt was provided  with two different types of compression garments with sizing printed out for her to order at home. Discussed silicone band vs. non-silicone band and that she may benefit from silicone band over none to prevent sliding.    Person(s) Educated  Patient    Methods  Explanation    Comprehension  Verbalized understanding       PT Short Term Goals - 06/01/19 1229      PT SHORT TERM GOAL #1   Title  Pt will be independent in self wrapping.    Baseline  pt does not know how to self wrap    Time  1    Period  Weeks    Status  New    Target Date  06/15/19        PT Long Term Goals - 06/01/19 1229      PT LONG TERM GOAL #1   Title  Pt will have 2 cm reduction in her L lower leg circumferential measurements in order to demonstrate decreased fluid in her L lower leg.    Baseline  see measurements.    Time  3    Period  Weeks    Status  New    Target Date  06/29/19      PT LONG  TERM GOAL #2   Title  Pt will be measured for and fitted with a garment to wear on a daily basis on bil lower legs in order to manage lymphedema at home.    Baseline  pt currently does not have garments.    Time  3    Period  Weeks    Status  New    Target Date  06/29/19      PT LONG TERM GOAL #3   Title  Pt will report 50% improvement in pain/aching and decreased edema in the Bil lower extremities in order to demonstrate a subjective improvement in quality of life with Lymphedema.    Baseline  pt states she has significant aching by the end of the day.    Time  3    Period  Weeks    Status  New    Target Date  06/29/19      PT LONG TERM GOAL #4   Title  Pt will be able to state lymphedema management and reduction practices including elevation, compression and diaphragmatic breathing in order to demonstrate autonomy with home management of lymphedema.    Baseline  pt was just taught concepts today.    Time  3    Period  Weeks    Status  New    Target Date  06/29/19            Plan - 06/27/19 [id number]    Clinical Impression Statement  Pt was assisted with choosing compression garments this session with 2 recommendations filled out with size and compression level for patient. She was educated on the difference between silicone top vs. non-silicone top. Pt time was spent with education today and assistance finding the correct garment. Pt questions were answered concerning the wraps and different types/levels of compression. Pt will benefit from continued POC at this time. She will return once she recieves her compression garments.    Personal Factors and Comorbidities  Comorbidity 1    Comorbidities  hx masectomy with 19 lymph node removal    Rehab Potential  Good    PT Frequency  2x / week    PT Duration  3 weeks    PT Treatment/Interventions  Neuromuscular re-education;Therapeutic exercise;Therapeutic activities;Manual techniques    PT Next Visit Plan  continue with self wrapping  and MLD only for the leg in conjunction with diaphramagtic breathing do not teach trunk MLD due to masectomy . Sign up for time with Melissa.    PT Home Exercise Plan  see pt instructions.    Consulted and Agree with Plan of Care  Patient       Patient will benefit from skilled therapeutic intervention in order to improve the following deficits and impairments:  Increased edema, Pain  Visit Diagnosis: Malignant neoplasm of upper-outer quadrant of right breast in female, estrogen receptor positive ([place])  Lymphedema, not elsewhere classified     Problem List Patient Active Problem List   Diagnosis Date Noted  . Vascular insufficiency of extremity 05/21/2019  . Lung nodule seen on imaging study 05/24/2016  . OAB (overactive bladder) 01/21/2016  . Vaginal atrophy 01/21/2016  . White coat syndrome without hypertension 06/26/2012  . Pain of left calf 06/26/2012  . TMJ click   . S/P chemotherapy, time since 4-12 weeks   . Rosacea   . History of external beam radiation therapy   . Red blood cell antibody positive, compatible PRBC difficult to obtain 12/29/2011  . Neoplastic disease 09/28/2011  . Malignant neoplasm of upper-outer quadrant of right breast in female, estrogen receptor positive ([place]) 05/19/2011  . Primary cancer of upper outer quadrant of right female breast ([place]) 05/06/2011    Ander Purpura 06/27/2019, 12:08 PM  [place], [place], [address] Phone: [phone]   Fax:  [phone]  Name: JAKIAH WATTLEY MRN: [id number] Date of Birth: 02/19/1945

## 2019-07-04 ENCOUNTER — Ambulatory Visit: Payer: Medicare Other

## 2019-07-04 ENCOUNTER — Other Ambulatory Visit: Payer: Self-pay

## 2019-07-04 DIAGNOSIS — I890 Lymphedema, not elsewhere classified: Secondary | ICD-10-CM

## 2019-07-04 DIAGNOSIS — Z170 Estrogen receptor positive status [ER+]: Secondary | ICD-10-CM | POA: Diagnosis not present

## 2019-07-04 DIAGNOSIS — C50411 Malignant neoplasm of upper-outer quadrant of right female breast: Secondary | ICD-10-CM | POA: Diagnosis not present

## 2019-07-04 NOTE — Therapy (Addendum)
[place], [place], [address] Phone: [phone]   Fax:  [phone]  Physical Therapy Discharge Note  Patient Details  Name: Kirsten Gibson MRN: [id number] Date of Birth: 08/06/1945 Referring Provider (PT): Lurline Del MD   Encounter Date: 07/04/2019  PT End of Session - 07/04/19 0908    Visit Number  6    Number of Visits  7    Date for PT Re-Evaluation  06/29/19    PT Start Time  0907    PT Stop Time  1000    PT Time Calculation (min)  53 min    Activity Tolerance  Patient tolerated treatment well    Behavior During Therapy  [facility] for tasks assessed/performed       Past Medical History:  Diagnosis Date  . Anemia due to chemotherapy 08/23/2011  . Anxiety   . Breast cancer ([place])    bilateral infiltrating ductal carcinoma  . Hx of radiation therapy 01/11/12- 02/28/12   right chest wall/supraclav fossa/drain site/scar boost  . Mycosis fungoides    follows up with [facility] once to twice per year   . Rosacea   . S/P chemotherapy, time since 4-12 weeks   . TMJ click    improved after changing the way dental exams were done  . Urinary frequency     Past Surgical History:  Procedure Laterality Date  . ABDOMINAL HYSTERECTOMY  1988   partial with on ovary remaining.  . benign tumor removed from neck  1974  . BREAST BIOPSY  05/19/11   left  and right breast  . BREAST EXCISIONAL BIOPSY  1991   benign  . CHOLECYSTECTOMY    . MASTECTOMY  11/2011   bilat, [place] Left 04/06/2012   Procedure: MINOR REMOVAL PORT-A-CATH;  Surgeon: [name], MD;  Location: [facility];  Service: General;  Laterality: Left;  . PORTACATH PLACEMENT  05/21/2011   Procedure: INSERTION PORT-A-CATH;  Surgeon: [name], MD;  [facility];  Service: General;  Laterality: Left;    There were no vitals filed for this visit.  Subjective Assessment - 07/04/19  0909    Subjective  Pt reports that she has recieved her compression garments and that she would like to see what I think. She states that she has no pain.    Pertinent History  History of Bil masectomy with 19 lymph nodes removed    Patient Stated Goals  I want to get the swelling down.    Currently in Pain?  No/denies    Multiple Pain Sites  No            LYMPHEDEMA/ONCOLOGY QUESTIONNAIRE - 07/04/19 0911      Right Lower Extremity Lymphedema   30 cm Proximal to Floor at Lateral Plantar Foot  29 cm    20 cm Proximal to Floor at Lateral Plantar Foot  _0 cm Proximal to Floor at Lateral Malleoli  17.8 cm    5 cm Proximal to 1st MTP Joint  17.3 cm    Across MTP Joint  18 cm    Around Proximal Great Toe  6 cm      Left Lower Extremity Lymphedema   At Midpatella/Popliteal Crease  30 cm    30 cm Proximal to Floor at Lateral Plantar Foot  30.5 cm    20 cm Proximal to Floor at Lateral Plantar Foot  25 cm  10 cm Proximal to Floor at Lateral Malleoli  18.5 cm    5 cm Proximal to 1st MTP Joint  18 cm    Across MTP Joint  17.5 cm    Around Proximal Great Toe  5.9 cm            Outpatient Rehab from 06/01/2019 in [place]  Lymphedema Life Impact Scale Total Score  13.24 %                   PT Education - 07/04/19 0926    Education Details  Extra time was spent today answering pt questions including level of compression, thigh high vs. leggings vs. knee highs. Discussed different types of leggings that pt can use and discussed it is important to make sure that she knows how her legs feel without swelling before trying mulitple different products. If she notices no increase in swelling with leggings then she can wear the leggings as much as she prefers. Pt was educated on how to don compression socks including putting on the hand flipping inside out then pulling over the heel and then pulling the compression garment up the leg.   Discussed when and how to wear her compression. Pt will be discharged today and is aware.    Person(s) Educated  Patient    Methods  Explanation;Demonstration;Verbal cues    Comprehension  Verbalized understanding;Returned demonstration       PT Short Term Goals - 07/04/19 0959      PT SHORT TERM GOAL #1   Title  Pt will be independent in self wrapping.    Baseline  Pt is independent with self wrapping.    Status  Achieved        PT Long Term Goals - 07/04/19 1000      PT LONG TERM GOAL #1   Title  Pt will have 2 cm reduction in her L lower leg circumferential measurements in order to demonstrate decreased fluid in her L lower leg.    Baseline  see measurements pt has had a decreased 2 cm circumferential measurements in the L mid lower leg.    Status  Achieved      PT LONG TERM GOAL #2   Title  Pt will be measured for and fitted with a garment to wear on a daily basis on bil lower legs in order to manage lymphedema at home.    Baseline  pt currently has 2 garments that she can wear on a daily basis.    Status  Achieved      PT LONG TERM GOAL #3   Title  Pt will report 50% improvement in pain/aching and decreased edema in the Bil lower extremities in order to demonstrate a subjective improvement in quality of life with Lymphedema.    Baseline  pt reports no pain    Status  Achieved      PT LONG TERM GOAL #4   Title  Pt will be able to state lymphedema management and reduction practices including elevation, compression and diaphragmatic breathing in order to demonstrate autonomy with home management of lymphedema.    Baseline  pt understand that she needs to wear compression daily, exercise and perform MLD 2x/week.    Status  Achieved            Plan - 07/04/19 0908    Clinical Impression Statement  Pt has met all of her goals. Discussed donning compression garments and types of compression garments  today including how to tell if compression is sufficient including: no  increase in aching or swelling. Pt will be discharged at this time to home management.    Personal Factors and Comorbidities  Comorbidity 1    Comorbidities  hx masectomy with 19 lymph node removal    Rehab Potential  Good    PT Frequency  2x / week    PT Duration  3 weeks    PT Treatment/Interventions  Neuromuscular re-education;Therapeutic exercise;Therapeutic activities;Manual techniques    PT Next Visit Plan  Pt will be discharged today    PT Home Exercise Plan  see pt instructions.    Consulted and Agree with Plan of Care  Patient       Patient will benefit from skilled therapeutic intervention in order to improve the following deficits and impairments:  Increased edema, Pain  Visit Diagnosis: Malignant neoplasm of upper-outer quadrant of right breast in female, estrogen receptor positive ([place])  Lymphedema, not elsewhere classified     Problem List Patient Active Problem List   Diagnosis Date Noted  . Vascular insufficiency of extremity 05/21/2019  . Lung nodule seen on imaging study 05/24/2016  . OAB (overactive bladder) 01/21/2016  . Vaginal atrophy 01/21/2016  . White coat syndrome without hypertension 06/26/2012  . Pain of left calf 06/26/2012  . TMJ click   . S/P chemotherapy, time since 4-12 weeks   . Rosacea   . History of external beam radiation therapy   . Red blood cell antibody positive, compatible PRBC difficult to obtain 12/29/2011  . Neoplastic disease 09/28/2011  . Malignant neoplasm of upper-outer quadrant of right breast in female, estrogen receptor positive ([place]) 05/19/2011  . Primary cancer of upper outer quadrant of right female breast ([place]) 05/06/2011   PHYSICAL THERAPY DISCHARGE SUMMARY   Plan: Patient agrees to discharge.  Patient goals were not met. Patient is being discharged due to meeting the stated rehab goals.  ?????       Ander Purpura, PT 07/04/2019, 10:08 AM  [place], [place], [address] Phone: [phone]   Fax:  [phone]  Name: Kirsten Gibson MRN: [id number] Date of Birth: 06/29/1945

## 2019-07-24 ENCOUNTER — Other Ambulatory Visit: Payer: Self-pay | Admitting: *Deleted

## 2019-07-24 MED ORDER — GABAPENTIN 100 MG PO CAPS: 300.0000 mg | ORAL_CAPSULE | Freq: Every day | ORAL | 4 refills | Status: DC

## 2019-07-24 NOTE — Telephone Encounter (Signed)
Pt called stating she noted " how good I felt after missing a dose of my tamoxifen " with inquiry Dr Gerarda Fraction perspective of stopping medication- she has been on it for 71/2 years.  This RN informed pt per MD review she may stop - with discussion of reason 10 years recommended with pt understanding the small difference in benefit of continuing.  Discussed also is best for her to monitor how she is feeling so if symptoms return that she felt was related to the tamoxifen she would know the cause was likely not this medication.  [place] verbalized understanding of above.  She also wanted to clarify her current use of gabapentin and how it was refilled- " I think it was misunderstood ".  She likes to use the 100 mg tablet and taper from using 1 tablet to 3 tablets depending on the neuropathy in her legs and feet.  This RN corrected prescription so the fill amount allows her to use as above.

## 2019-11-15 DIAGNOSIS — R194 Change in bowel habit: Secondary | ICD-10-CM | POA: Diagnosis not present

## 2020-01-21 DIAGNOSIS — H2513 Age-related nuclear cataract, bilateral: Secondary | ICD-10-CM | POA: Diagnosis not present

## 2020-01-21 DIAGNOSIS — H11123 Conjunctival concretions, bilateral: Secondary | ICD-10-CM | POA: Diagnosis not present

## 2020-02-28 DIAGNOSIS — L820 Inflamed seborrheic keratosis: Secondary | ICD-10-CM | POA: Diagnosis not present

## 2020-02-28 DIAGNOSIS — D485 Neoplasm of uncertain behavior of skin: Secondary | ICD-10-CM | POA: Diagnosis not present

## 2020-02-28 DIAGNOSIS — L821 Other seborrheic keratosis: Secondary | ICD-10-CM | POA: Diagnosis not present

## 2020-05-02 DIAGNOSIS — B356 Tinea cruris: Secondary | ICD-10-CM | POA: Diagnosis not present

## 2020-05-02 DIAGNOSIS — I10 Essential (primary) hypertension: Secondary | ICD-10-CM | POA: Diagnosis not present

## 2020-05-02 DIAGNOSIS — L739 Follicular disorder, unspecified: Secondary | ICD-10-CM | POA: Diagnosis not present

## 2020-05-18 NOTE — Progress Notes (Signed)
ID: [facility]   DOB: [birth year]  MR#: [id number]  INO#:[id number]  Patient Care Team: [name], MD as PCP - General (Family Medicine) Thea Silversmith, MD (Radiation Oncology) Sheilah Mins, MD as Surgeon (Surgical Oncology) Vibhav Waddill, Virgie Dad, MD as Consulting Physician (Oncology) Renaldo Harrison, MD as Referring Physician (Dermatology) OTHER MD:  CHIEF COMPLAINT: estrogen receptor positive breast cancer (s/p bilateral mastectomies)  CURRENT TREATMENT: Observation   INTERVAL HISTORY: [facility] returns today for follow-up of her estrogen receptor positive breast cancer.   She went off tamoxifen July 2021.  She feels she has fewer hot flashes, more energy, and also less of a queasy stomach.  Since her last visit, she has not undergone any additional studies.  She is concerned that she had some changes in the low-dose CT of the chest obtained April 2018 and wonders if she should have a chest x-ray at this point.   REVIEW OF SYSTEMS: [facility] denies unusual headaches visual changes cough phlegm production pleurisy shortness of breath or change in bowel or bladder habits.  There is no unexplained weight loss or unexplained fatigue.  She has not noted any skin changes of concern.  She does have a yeast infection which is being taken care of by her primary care physician.  A detailed review of systems today was otherwise stable.   COVID 19 VACCINATION STATUS: Status post Moderna x2+ booster x2   HISTORY OF PRESENT ILLNESS: From the original intake note:  She palpated a right breast mass March 2013 and presented for a mammogram. This confirmed the presence of a 1.9 cm mass in the upper outer quadrants. And adjacent mass was noted which was possibly thought to represent a intramammary lymph node. A right axillary lymph node was also noted to be enlarged. Biopsy of the upper outer quadrant mass showed a grade 2 invasive ductal carcinoma which was ER/PR positive HER-2 positive. Ki 67 was  12%. An MRI of the breast was performed 05/10/2011 which showed an overall area of the right breast measuring 5.0 x 2.6 x 2.2 cm of clumped linear enhancement. Majority of this was felt to represent DCIS. A small area of clumped enhancement was noted in the left breast which was not seen on the mammogram. A biopsy of this area was recommended. A biopsy of the right axillary lymph node was also positive for invasive ductal carcinoma.   The patient's subsequent history is as detailed below   PAST MEDICAL HISTORY: Past Medical History:  Diagnosis Date  . Anemia due to chemotherapy 08/23/2011  . Anxiety   . Breast cancer ([facility])    bilateral infiltrating ductal carcinoma  . Hx of radiation therapy 01/11/12- 02/28/12   right chest wall/supraclav fossa/drain site/scar boost  . Mycosis fungoides    follows up with [facility] once to twice per year   . Rosacea   . S/P chemotherapy, time since 4-12 weeks   . TMJ click    improved after changing the way dental exams were done  . Urinary frequency     PAST SURGICAL HISTORY: Past Surgical History:  Procedure Laterality Date  . ABDOMINAL HYSTERECTOMY  1988   partial with on ovary remaining.  . benign tumor removed from neck  1974  . BREAST BIOPSY  05/19/11   left  and right breast  . BREAST EXCISIONAL BIOPSY  1991   benign  . CHOLECYSTECTOMY    . MASTECTOMY  11/2011   bilat, [facility] Left 04/06/2012  Procedure: MINOR REMOVAL PORT-A-CATH;  Surgeon: [name], MD;  Location: [facility];  Service: General;  Laterality: Left;  . PORTACATH PLACEMENT  05/21/2011   Procedure: INSERTION PORT-A-CATH;  Surgeon: [name], MD;  Location: [facility];  Service: General;  Laterality: Left;    FAMILY HISTORY Family History  Problem Relation Age of Onset  . Cancer Maternal Aunt        breast to brain  . Cancer Paternal Grandmother        breast and/or ovarian///unknown  . Stomach cancer Paternal  Grandfather        stomach  . Colon cancer Neg Hx    the patient's father died at age 80 in January 4034 from complications of diabetes. The patient had no brothers, 3 sisters. One of the patient's mother's 2 sisters was diagnosed with breast cancer in her late 38s. There is no other history of breast or ovarian cancer in the family. The patient tells me she was evaluated for genetic testing at [facility], but that the insurance would not approve of the test.   GYNECOLOGIC HISTORY: Menarche age 20, she is GX P0. She underwent hysterectomy and unilateral salpingo-oophorectomy in 1988. She took hormone replacement for approximately 3 years.   SOCIAL HISTORY: She used to work at [employer], but is now retired. Her husband, [name], used to work for the [employer]. He has a son from a prior marriage, and 2 grandchildren. The patient attends [employer].    ADVANCED DIRECTIVES:  patient's husband is her healthcare power of attorney   HEALTH MAINTENANCE: Social History   Tobacco Use  . Smoking status: Former Smoker    Packs/day: 1.00    Years: 20.00    Pack years: 20.00    Types: Cigarettes    Quit date: 02/08/1986    Years since quitting: 34.2  . Smokeless tobacco: Never Used  Substance Use Topics  . Alcohol use: No  . Drug use: No     Colonoscopy:  April 2014, [facility]  PAP:  Bone density: 05/20/2017 T-score of -2.2 osteopenia  Lipid panel:  Allergies  Allergen Reactions  . Latex Dermatitis and Rash  . Adhesive [Tape]     ALLERGIC TO OP-SITE......USE OP-SITE FLEXIGRID INSTEAD !!!  . Trazodone And Nefazodone Nausea Only  . Zofran [Ondansetron Hcl] Swelling    Swelling&redness to face!!  . Diclofenac Other (See Comments)    Fever, chills  . Naproxen Other (See Comments)    Fever, chills    Current Outpatient Medications  Medication Sig Dispense Refill  . aspirin EC 81 MG tablet Take 1 tablet (81 mg total) by  mouth once a week.    . calcium carbonate (OS-CAL) 600 MG TABS Take 750 mg by mouth daily.     [name] gabapentin (NEURONTIN) 100 MG capsule Take 3 capsules (300 mg total) by mouth at bedtime. 270 capsule 4  . Multiple Vitamins-Minerals (MULTIVITAMIN PO) Take 1 capsule by mouth daily.    . tamoxifen (NOLVADEX) 20 MG tablet TAKE 1 TABLET(20 MG) BY MOUTH DAILY 90 tablet 3  . zolpidem (AMBIEN) 5 MG tablet TAKE 1 TABLET BY MOUTH EVERY DAY AT BEDTIME 20 tablet 0   No current facility-administered medications for this visit.    OBJECTIVE: white woman who appears stated age 56:   05/19/20 1033  BP: 140/74  Pulse: 63  Resp: 18  Temp: (!) 97.5 F (36.4 C)  SpO2: 97%  Body mass index is 21.35 kg/m.    ECOG FS: 0 Filed Weights   05/19/20 1033  Weight: 109 lb 4.8 oz (49.6 kg)    Sclerae unicteric, EOMs intact Wearing a mask No cervical or supraclavicular adenopathy Lungs no rales or rhonchi Heart regular rate and rhythm Abd soft, nontender, positive bowel sounds MSK no focal spinal tenderness, no upper extremity lymphedema Neuro: nonfocal, well oriented, appropriate affect Breasts: Status post bilateral mastectomies.  There is no evidence of chest wall recurrence.  Both axillae are benign.  LAB RESULTS: Lab Results  Component Value Date   WBC 6.0 05/19/2020   NEUTROABS 3.2 05/19/2020   HGB 14.2 05/19/2020   HCT 42.2 05/19/2020   MCV 90.8 05/19/2020   PLT 245 05/19/2020      Chemistry      Component Value Date/Time   NA 138 05/21/2019 1030   NA 140 11/23/2016 0858   K 3.6 05/21/2019 1030   K 4.2 11/23/2016 0858   CL 103 05/21/2019 1030   CL 104 06/28/2012 0950   CO2 26 05/21/2019 1030   CO2 27 11/23/2016 0858   BUN 16 05/21/2019 1030   BUN 24.4 11/23/2016 0858   CREATININE 0.76 05/21/2019 1030   CREATININE 0.9 11/23/2016 0858      Component Value Date/Time   CALCIUM 9.0 05/21/2019 1030   CALCIUM 9.0 11/23/2016 0858   ALKPHOS 56 05/21/2019 1030   ALKPHOS 62  11/23/2016 0858   AST 29 05/21/2019 1030   AST 25 11/23/2016 0858   ALT 28 05/21/2019 1030   ALT 22 11/23/2016 0858   BILITOT 0.4 05/21/2019 1030   BILITOT 0.23 11/23/2016 0858      Lab Results  Component Value Date   LABCA2 21 02/18/2012    STUDIES: No results found.   ASSESSMENT: 75 y.o. [place] woman  (1) status post right breast and axillary lymph node biopsy 05/04/2011, both positive for a grade 2 invasive ductal carcinoma, cT2 pN1 or stage IIB,estrogen receptor 82% and progesterone receptor 92% positive, with an MIB-1 of 12%, and HER-2 amplification by CISH with a ratio of 2.86  (2) biopsy 05/19/2011 of a second right breast area showed low-grade invasive ductal carcinoma, estrogen and progesterone receptor both 100% positive, with an MIB-1 of 13%, and no HER-2 amplification  (3) left breast biopsy 05/19/2011 showed a low-grade invasive ductal carcinoma, e-cadherin positive, HER-2 negative  (4) neoadjuvant treatment consisted of carboplatin/ docetaxel/ trastuzumab x6, started 06/02/2011 and completed 09/29/2011  (5) status post bilateral [facility]omies [facility] October 2013 showing  (a) on the right, a residual 1.2 cm invasive ductal carcinoma in the breast, with 3 of 19 lymph nodes involved; an additional lymph node showed only isolated tumor cells, so this is a ypT1c ypN1 result  (b) on the left, there was a 1 mm area of residual invasive ductal carcinoma, with all 5 sentinel lymph nodes negative  (6) the patient completed postmastectomy radiation to the right chest wall and right supraclavicular fossa 02/28/2012  (7) status post-op trastuzumab/ pertuzumab x3, discontinued 01/28/2012  (8) trastuzumab continued 02/18/2012, completed April 2014. Final echocardiogram 06/26/2012 showed a well preserved ejection fraction.  (9) tamoxifen started 03/25/2012, discontinued July 2021  (10) osteopenia, with bone density 05/23/2014 showing a T score of -1.9  (a) bone  density scan on 05/20/2017 with a T-score of -2.2 osteopenia.   (11) history of greater than 20 pack year smoking, low-dose screening chest CT SEPT 2015 negative  (a) CT chest 06/04/2016 shows  no evidence of malignancy.  (12) blood pressure and blood draws preferentially left arm  (13) history of mycosis fungoides initially diagnosed October 2006 with a Sezary cell prep of 0; [facility].  (14) discharged to survivorship after April 2022 visit   PLAN:  [facility] is now 8-1/2 years out from definitive surgery for her breast cancer with no evidence of disease recurrence.  This is very favorable.  She took tamoxifen for 8years.  She generally feels better off the medication and there is no reason to return to an antiestrogen at this point.  She is very concerned about recurrence in the lungs.  Currently she is asymptomatic and I advised her against obtaining a chest x-ray since it is much more likely to show false positives then a real problem and she might end up needing a biopsy.  I reviewed the prior CT scan with her.  However after much discussion she remains anxious and she would strongly want to obtain a chest x-ray for reassurance.  That order was placed.  I offered to release her to her primary care physician at this point but she would like to continue to be seen here in a yearly basis.  We can certainly accommodate that through our survivorship program and we discussed that.  She is amenable so she will see our survivorship nurse practitioner in 1 year and yearly thereafter  I will be glad to see [facility] again at any point in the future if and when the need arises but as of now are making her no further routine appointments with me here.  Total encounter time 25 minutes.*  [facility], Virgie Dad, MD  05/19/20 10:38 AM Medical Oncology and Hematology [facility] [place], [place] [address] Tel. [phone]    Fax. [phone]   I, Wilburn Mylar, am acting as scribe for Dr. Virgie Dad. Otelia Hettinger.  I, Lurline Del MD, have reviewed the above documentation for accuracy and completeness, and I agree with the above.   *Total Encounter Time as defined by the Centers for Medicare and Medicaid Services includes, in addition to the face-to-face time of a patient visit (documented in the note above) non-face-to-face time: obtaining and reviewing outside history, ordering and reviewing medications, tests or procedures, care coordination (communications with other health care professionals or caregivers) and documentation in the medical record.

## 2020-05-19 ENCOUNTER — Other Ambulatory Visit: Payer: Self-pay

## 2020-05-19 ENCOUNTER — Inpatient Hospital Stay: Payer: Medicare Other

## 2020-05-19 ENCOUNTER — Ambulatory Visit (HOSPITAL_COMMUNITY)
Admission: RE | Admit: 2020-05-19 | Discharge: 2020-05-19 | Disposition: A | Discharge status: Home / Self Care | Payer: Medicare Other | Source: Ambulatory Visit | Attending: Oncology | Admitting: Oncology

## 2020-05-19 ENCOUNTER — Inpatient Hospital Stay: Payer: Medicare Other | Attending: Oncology | Admitting: Oncology

## 2020-05-19 VITALS — BP 140/74 | HR 63 | Temp 97.5°F | Resp 18 | Ht 60.0 in | Wt 109.3 lb

## 2020-05-19 DIAGNOSIS — Z170 Estrogen receptor positive status [ER+]: Secondary | ICD-10-CM

## 2020-05-19 DIAGNOSIS — C50411 Malignant neoplasm of upper-outer quadrant of right female breast: Secondary | ICD-10-CM

## 2020-05-19 DIAGNOSIS — M8580 Other specified disorders of bone density and structure, unspecified site: Secondary | ICD-10-CM | POA: Insufficient documentation

## 2020-05-19 DIAGNOSIS — F419 Anxiety disorder, unspecified: Secondary | ICD-10-CM

## 2020-05-19 DIAGNOSIS — Z853 Personal history of malignant neoplasm of breast: Secondary | ICD-10-CM | POA: Diagnosis not present

## 2020-05-19 DIAGNOSIS — I700 Atherosclerosis of aorta: Secondary | ICD-10-CM | POA: Diagnosis not present

## 2020-05-19 LAB — CBC WITH DIFFERENTIAL/PLATELET
Abs Immature Granulocytes: 0.01 10*3/uL (ref 0.00–0.07)
Basophils Absolute: 0.1 10*3/uL (ref 0.0–0.1)
Basophils Relative: 1 %
Eosinophils Absolute: 0.1 10*3/uL (ref 0.0–0.5)
Eosinophils Relative: 1 %
HCT: 42.2 % (ref 36.0–46.0)
Hemoglobin: 14.2 g/dL (ref 12.0–15.0)
Immature Granulocytes: 0 %
Lymphocytes Relative: 35 %
Lymphs Abs: 2.1 10*3/uL (ref 0.7–4.0)
MCH: 30.5 pg (ref 26.0–34.0)
MCHC: 33.6 g/dL (ref 30.0–36.0)
MCV: 90.8 fL (ref 80.0–100.0)
Monocytes Absolute: 0.5 10*3/uL (ref 0.1–1.0)
Monocytes Relative: 9 %
Neutro Abs: 3.2 10*3/uL (ref 1.7–7.7)
Neutrophils Relative %: 54 %
Platelets: 245 10*3/uL (ref 150–400)
RBC: 4.65 MIL/uL (ref 3.87–5.11)
RDW: 12.7 % (ref 11.5–15.5)
WBC: 6 10*3/uL (ref 4.0–10.5)
nRBC: 0 % (ref 0.0–0.2)

## 2020-05-19 LAB — COMPREHENSIVE METABOLIC PANEL
ALT: 23 U/L (ref 0–44)
AST: 27 U/L (ref 15–41)
Albumin: 4.2 g/dL (ref 3.5–5.0)
Alkaline Phosphatase: 84 U/L (ref 38–126)
Anion gap: 13 (ref 5–15)
BUN: 26 mg/dL — ABNORMAL HIGH (ref 8–23)
CO2: 25 mmol/L (ref 22–32)
Calcium: 9.5 mg/dL (ref 8.9–10.3)
Chloride: 102 mmol/L (ref 98–111)
Creatinine, Ser: 0.76 mg/dL (ref 0.44–1.00)
GFR, Estimated: >60 mL/min (ref >60)
Glucose, Bld: 95 mg/dL (ref 70–99)
Potassium: 4.2 mmol/L (ref 3.5–5.1)
Sodium: 140 mmol/L (ref 135–145)
Total Bilirubin: 0.5 mg/dL (ref 0.3–1.2)
Total Protein: 7.4 g/dL (ref 6.5–8.1)

## 2020-05-19 MED ORDER — ZOLPIDEM TARTRATE 5 MG PO TABS: ORAL_TABLET | ORAL | 0 refills | Status: DC

## 2020-08-15 DIAGNOSIS — L814 Other melanin hyperpigmentation: Secondary | ICD-10-CM | POA: Diagnosis not present

## 2020-08-15 DIAGNOSIS — Z808 Family history of malignant neoplasm of other organs or systems: Secondary | ICD-10-CM | POA: Diagnosis not present

## 2020-08-15 DIAGNOSIS — L821 Other seborrheic keratosis: Secondary | ICD-10-CM | POA: Diagnosis not present

## 2020-08-15 DIAGNOSIS — D225 Melanocytic nevi of trunk: Secondary | ICD-10-CM | POA: Diagnosis not present

## 2020-08-15 DIAGNOSIS — L719 Rosacea, unspecified: Secondary | ICD-10-CM | POA: Diagnosis not present

## 2020-08-15 DIAGNOSIS — L578 Other skin changes due to chronic exposure to nonionizing radiation: Secondary | ICD-10-CM | POA: Diagnosis not present

## 2021-05-06 ENCOUNTER — Telehealth: Payer: Self-pay | Admitting: Adult Health

## 2021-05-06 NOTE — Telephone Encounter (Signed)
Rescheduled appointment per provider PAL. Patient is aware. 

## 2021-05-19 ENCOUNTER — Inpatient Hospital Stay: Payer: Medicare Other | Admitting: Adult Health

## 2021-05-19 ENCOUNTER — Inpatient Hospital Stay: Payer: Medicare Other

## 2021-06-11 ENCOUNTER — Inpatient Hospital Stay: Payer: Medicare Other

## 2021-06-11 ENCOUNTER — Inpatient Hospital Stay: Payer: Medicare Other | Admitting: Adult Health

## 2021-06-11 ENCOUNTER — Other Ambulatory Visit: Payer: Self-pay | Admitting: Hematology and Oncology

## 2021-06-11 ENCOUNTER — Other Ambulatory Visit: Payer: Self-pay

## 2021-06-11 ENCOUNTER — Encounter: Payer: Self-pay | Admitting: Hematology and Oncology

## 2021-06-11 ENCOUNTER — Inpatient Hospital Stay: Payer: Medicare Other | Attending: Adult Health | Admitting: Hematology and Oncology

## 2021-06-11 VITALS — BP 139/73 | HR 71 | Temp 98.1°F | Wt 118.6 lb

## 2021-06-11 DIAGNOSIS — Z08 Encounter for follow-up examination after completed treatment for malignant neoplasm: Secondary | ICD-10-CM | POA: Diagnosis not present

## 2021-06-11 DIAGNOSIS — Z170 Estrogen receptor positive status [ER+]: Secondary | ICD-10-CM | POA: Diagnosis not present

## 2021-06-11 DIAGNOSIS — Z853 Personal history of malignant neoplasm of breast: Secondary | ICD-10-CM | POA: Diagnosis not present

## 2021-06-11 DIAGNOSIS — M899 Disorder of bone, unspecified: Secondary | ICD-10-CM | POA: Diagnosis not present

## 2021-06-11 DIAGNOSIS — C50411 Malignant neoplasm of upper-outer quadrant of right female breast: Secondary | ICD-10-CM

## 2021-06-11 DIAGNOSIS — M8580 Other specified disorders of bone density and structure, unspecified site: Secondary | ICD-10-CM | POA: Diagnosis not present

## 2021-06-11 DIAGNOSIS — Z79899 Other long term (current) drug therapy: Secondary | ICD-10-CM | POA: Diagnosis not present

## 2021-06-11 DIAGNOSIS — E7800 Pure hypercholesterolemia, unspecified: Secondary | ICD-10-CM | POA: Diagnosis not present

## 2021-06-11 LAB — COMPREHENSIVE METABOLIC PANEL
ALT: 22 U/L (ref 0–44)
AST: 26 U/L (ref 15–41)
Albumin: 4.1 g/dL (ref 3.5–5.0)
Alkaline Phosphatase: 71 U/L (ref 38–126)
Anion gap: 7 (ref 5–15)
BUN: 20 mg/dL (ref 8–23)
CO2: 28 mmol/L (ref 22–32)
Calcium: 9.5 mg/dL (ref 8.9–10.3)
Chloride: 101 mmol/L (ref 98–111)
Creatinine, Ser: 0.72 mg/dL (ref 0.44–1.00)
GFR, Estimated: >60 mL/min (ref >60)
Glucose, Bld: 94 mg/dL (ref 70–99)
Potassium: 3.9 mmol/L (ref 3.5–5.1)
Sodium: 136 mmol/L (ref 135–145)
Total Bilirubin: 0.4 mg/dL (ref 0.3–1.2)
Total Protein: 7.1 g/dL (ref 6.5–8.1)

## 2021-06-11 LAB — CBC WITH DIFFERENTIAL/PLATELET
Abs Immature Granulocytes: 0.01 10*3/uL (ref 0.00–0.07)
Basophils Absolute: 0.1 10*3/uL (ref 0.0–0.1)
Basophils Relative: 1 %
Eosinophils Absolute: 0.1 10*3/uL (ref 0.0–0.5)
Eosinophils Relative: 2 %
HCT: 36.9 % (ref 36.0–46.0)
Hemoglobin: 12.3 g/dL (ref 12.0–15.0)
Immature Granulocytes: 0 %
Lymphocytes Relative: 31 %
Lymphs Abs: 1.9 10*3/uL (ref 0.7–4.0)
MCH: 30.5 pg (ref 26.0–34.0)
MCHC: 33.3 g/dL (ref 30.0–36.0)
MCV: 91.6 fL (ref 80.0–100.0)
Monocytes Absolute: 0.6 10*3/uL (ref 0.1–1.0)
Monocytes Relative: 10 %
Neutro Abs: 3.6 10*3/uL (ref 1.7–7.7)
Neutrophils Relative %: 56 %
Platelets: 246 10*3/uL (ref 150–400)
RBC: 4.03 MIL/uL (ref 3.87–5.11)
RDW: 13.2 % (ref 11.5–15.5)
WBC: 6.3 10*3/uL (ref 4.0–10.5)
nRBC: 0 % (ref 0.0–0.2)

## 2021-06-11 NOTE — Progress Notes (Signed)
ID: [facility]   DOB: 1945/11/19  MR#: [id number]  BMS#:[id number] ? ?Patient Care Team: ?[name], MD as PCP - General (Family Medicine) ?Thea Silversmith, MD (Radiation Oncology) ?Sheilah Mins, MD as Surgeon (Surgical Oncology) ?Magrinat, Virgie Dad, MD (Inactive) as Consulting Physician (Oncology) ?Renaldo Harrison, MD as Referring Physician (Dermatology) ?OTHER MD: ? ?CHIEF COMPLAINT: estrogen receptor positive breast cancer (s/p bilateral mastectomies) ? ?CURRENT TREATMENT: Observation ? ? ?INTERVAL HISTORY: ?[facility] returns today for follow-up of her estrogen receptor positive breast cancer.  ?She went off tamoxifen July 2021.  In hindsight, she feels like she should have continue tamoxifen for longer time but she says it did not agree with her, she had an upset stomach while she was on the medication.  She denies any new health changes.  She has had bilateral mastectomy and no reconstruction.  She denies any changes in breathing or bowel habits or urinary habits.  No new headaches or falls or any other symptoms that are concerning for recurrence. ?Basically she did not complain of any symptoms today for [place] to address. ? ? COVID 19 VACCINATION STATUS: Status post Moderna x2+ booster x2 ? ? ?HISTORY OF PRESENT ILLNESS: ?From the original intake note: ? ?She palpated a right breast mass March 2013 and presented for a mammogram. This confirmed the presence of a 1.9 cm mass in the upper outer quadrants. And adjacent mass was noted which was possibly thought to represent a intramammary lymph node. A right axillary lymph node was also noted to be enlarged. Biopsy of the upper outer quadrant mass showed a grade 2 invasive ductal carcinoma which was ER/PR positive HER-2 positive. Ki 67 was 12%. An MRI of the breast was performed 05/10/2011 which showed an overall area of the right breast measuring 5.0 x 2.6 x 2.2 cm of clumped linear enhancement. Majority of this was felt to represent DCIS. A small area of  clumped enhancement was noted in the left breast which was not seen on the mammogram. A biopsy of this area was recommended. A biopsy of the right axillary lymph node was also positive for invasive ductal carcinoma. ? ? The patient's subsequent history is as detailed below ? ? ?PAST MEDICAL HISTORY: ?Past Medical History:  ?Diagnosis Date  ? Anemia due to chemotherapy 08/23/2011  ? Anxiety   ? Breast cancer ([place])   ? bilateral infiltrating ductal carcinoma  ? Hx of radiation therapy 01/11/12- 02/28/12  ? right chest wall/supraclav fossa/drain site/scar boost  ? Mycosis fungoides   ? follows up with [facility] once to twice per year   ? Rosacea   ? S/P chemotherapy, time since 4-12 weeks   ? TMJ click   ? improved after changing the way dental exams were done  ? Urinary frequency   ? ? ?PAST SURGICAL HISTORY: ?Past Surgical History:  ?Procedure Laterality Date  ? ABDOMINAL HYSTERECTOMY  1988  ? partial with on ovary remaining.  ? benign tumor removed from neck  1974  ? BREAST BIOPSY  05/19/11  ? left  and right breast  ? BREAST EXCISIONAL BIOPSY  1991  ? benign  ? CHOLECYSTECTOMY    ? MASTECTOMY  11/2011  ? bilat, [facility] Left 04/06/2012  ? Procedure: MINOR REMOVAL PORT-A-CATH;  Surgeon: [name], MD;  Location: [facility];  Service: General;  Laterality: Left;  ? PORTACATH PLACEMENT  05/21/2011  ? Procedure: INSERTION PORT-A-CATH;  Surgeon: [name], MD;  Location:  [facility];  Service: General;  Laterality: Left;  ? ? ?FAMILY HISTORY ?Family History  ?Problem Relation Age of Onset  ? Cancer Maternal Aunt   ?     breast to brain  ? Cancer Paternal Grandmother   ?     breast and/or ovarian///unknown  ? Stomach cancer Paternal Grandfather   ?     stomach  ? Colon cancer Neg Hx   ? the patient's father died at age 79 in January 3419 from complications of diabetes. The patient had no brothers, 3 sisters. One of the patient's mother's 2 sisters was diagnosed with  breast cancer in her late 32s. There is no other history of breast or ovarian cancer in the family. The patient tells me she was evaluated for genetic testing at [facility], but that the insurance would not approve of the test. ? ? ?GYNECOLOGIC HISTORY: ?Menarche age 32, she is GX P0. She underwent hysterectomy and unilateral salpingo-oophorectomy in 1988. She took hormone replacement for approximately 3 years. ? ? ?SOCIAL HISTORY: ?She used to work at [employer], but is now retired. Her husband, [name], used to work for the [employer]. He has a son from a prior marriage, and 2 grandchildren. The patient attends [employer].  ? ? ADVANCED DIRECTIVES:  patient's husband is her healthcare power of attorney ? ? ?HEALTH MAINTENANCE: ?Social History  ? ?Tobacco Use  ? Smoking status: Former  ?  Packs/day: 1.00  ?  Years: 20.00  ?  Pack years: 20.00  ?  Types: Cigarettes  ?  Quit date: 02/08/1986  ?  Years since quitting: 35.3  ? Smokeless tobacco: Never  ?Substance Use Topics  ? Alcohol use: No  ? Drug use: No  ? ? ? Colonoscopy:  April 2014, [facility] ? PAP: ? Bone density: 05/20/2017 T-score of -2.2 osteopenia ? Lipid panel: ? ?Allergies  ?Allergen Reactions  ? Latex Dermatitis and Rash  ? Adhesive [Tape]   ?  ALLERGIC TO OP-SITE......USE OP-SITE FLEXIGRID INSTEAD !!!  ? Trazodone And Nefazodone Nausea Only  ? Zofran [Ondansetron Hcl] Swelling  ?  Swelling&redness to face!!  ? Diclofenac Other (See Comments)  ?  Fever, chills  ? Naproxen Other (See Comments)  ?  Fever, chills  ? ? ?Current Outpatient Medications  ?Medication Sig Dispense Refill  ? aspirin EC 81 MG tablet Take 1 tablet (81 mg total) by mouth once a week.    ? calcium carbonate (OS-CAL) 600 MG TABS Take 750 mg by mouth daily.     ? gabapentin (NEURONTIN) 100 MG capsule Take 3 capsules (300 mg total) by mouth at bedtime. 270 capsule 4  ? Multiple Vitamins-Minerals (MULTIVITAMIN PO) Take 1 capsule  by mouth daily.    ? tamoxifen (NOLVADEX) 20 MG tablet TAKE 1 TABLET(20 MG) BY MOUTH DAILY 90 tablet 3  ? zolpidem (AMBIEN) 5 MG tablet TAKE 1 TABLET BY MOUTH EVERY DAY AT BEDTIME 20 tablet 0  ? ?No current facility-administered medications for this visit.  ? ? ?OBJECTIVE: white woman who appears stated age ?Vitals:  ? 06/11/21 0848  ?BP: 139/73  ?Pulse: 71  ?Temp: 98.1 ?F (36.7 ?C)  ?SpO2: 100%  ?   Body mass index is 23.16 kg/m?[name]    ECOG FS: 0 ?Filed Weights  ? 06/11/21 0848  ?Weight: 118 lb 9.6 oz (53.8 kg)  ? ?Physical Exam ?Constitutional:   ?   Appearance: Normal appearance.  ?Cardiovascular:  ?   Rate and  Rhythm: Normal rate and regular rhythm.  ?Chest:  ?   Comments: Status post bilateral mastectomies.  No chest wall recurrence or palpable regional adenopathy ?Musculoskeletal:     ?   General: No swelling or tenderness.  ?   Cervical back: Normal range of motion and neck supple. No rigidity.  ?Lymphadenopathy:  ?   Cervical: No cervical adenopathy.  ?Skin: ?   General: Skin is warm and dry.  ?Neurological:  ?   General: No focal deficit present.  ?   Mental Status: She is alert.  ?Psychiatric:     ?   Mood and Affect: Mood normal.  ? ? ? ?LAB RESULTS: ?Lab Results  ?Component Value Date  ? WBC 6.3 06/11/2021  ? NEUTROABS 3.6 06/11/2021  ? HGB 12.3 06/11/2021  ? HCT 36.9 06/11/2021  ? MCV 91.6 06/11/2021  ? PLT 246 06/11/2021  ? ? ?  Chemistry   ?   ?Component Value Date/Time  ? NA 140 05/19/2020 0955  ? NA 140 11/23/2016 0858  ? K 4.2 05/19/2020 0955  ? K 4.2 11/23/2016 0858  ? CL 102 05/19/2020 0955  ? CL 104 06/28/2012 0950  ? CO2 25 05/19/2020 0955  ? CO2 27 11/23/2016 0858  ? BUN 26 (H) 05/19/2020 0955  ? BUN 24.4 11/23/2016 0858  ? CREATININE 0.76 05/19/2020 0955  ? CREATININE 0.76 05/21/2019 1030  ? CREATININE 0.9 11/23/2016 0858  ?    ?Component Value Date/Time  ? CALCIUM 9.5 05/19/2020 0955  ? CALCIUM 9.0 11/23/2016 0858  ? ALKPHOS 84 05/19/2020 0955  ? ALKPHOS 62 11/23/2016 0858  ? AST 27 05/19/2020  0955  ? AST 29 05/21/2019 1030  ? AST 25 11/23/2016 0858  ? ALT 23 05/19/2020 0955  ? ALT 28 05/21/2019 1030  ? ALT 22 11/23/2016 0858  ? BILITOT 0.5 05/19/2020 0955  ? BILITOT 0.4 05/21/2019 1030  ? BILIT

## 2021-06-25 DIAGNOSIS — C50919 Malignant neoplasm of unspecified site of unspecified female breast: Secondary | ICD-10-CM | POA: Diagnosis not present

## 2021-06-25 DIAGNOSIS — Z9013 Acquired absence of bilateral breasts and nipples: Secondary | ICD-10-CM | POA: Diagnosis not present

## 2021-06-25 DIAGNOSIS — H9190 Unspecified hearing loss, unspecified ear: Secondary | ICD-10-CM | POA: Diagnosis not present

## 2021-06-25 DIAGNOSIS — I890 Lymphedema, not elsewhere classified: Secondary | ICD-10-CM | POA: Diagnosis not present

## 2021-06-25 DIAGNOSIS — E7800 Pure hypercholesterolemia, unspecified: Secondary | ICD-10-CM | POA: Diagnosis not present

## 2021-06-25 DIAGNOSIS — Z0000 Encounter for general adult medical examination without abnormal findings: Secondary | ICD-10-CM | POA: Diagnosis not present

## 2021-06-25 DIAGNOSIS — M8588 Other specified disorders of bone density and structure, other site: Secondary | ICD-10-CM | POA: Diagnosis not present

## 2021-06-25 DIAGNOSIS — G620 Drug-induced polyneuropathy: Secondary | ICD-10-CM | POA: Diagnosis not present

## 2021-06-25 DIAGNOSIS — I10 Essential (primary) hypertension: Secondary | ICD-10-CM | POA: Diagnosis not present

## 2021-06-29 ENCOUNTER — Other Ambulatory Visit: Payer: Self-pay | Admitting: *Deleted

## 2021-06-29 DIAGNOSIS — M7989 Other specified soft tissue disorders: Secondary | ICD-10-CM

## 2021-06-29 DIAGNOSIS — Z170 Estrogen receptor positive status [ER+]: Secondary | ICD-10-CM

## 2021-06-30 NOTE — Therapy (Addendum)
OUTPATIENT PHYSICAL THERAPY ONCOLOGY EVALUATION  Patient Name: Kirsten Gibson MRN: [id number] DOB:1945/09/01, 76 y.o., female Today's Date: 07/01/2021   PT End of Session - 07/01/21 0842     Visit Number 1    Number of Visits 1    PT Start Time 0801    PT Stop Time 0840    PT Time Calculation (min) 39 min    Activity Tolerance Patient tolerated treatment well    Behavior During Therapy [facility] for tasks assessed/performed             Past Medical History:  Diagnosis Date   Anemia due to chemotherapy 08/23/2011   Anxiety    Breast cancer ([place])    bilateral infiltrating ductal carcinoma   Hx of radiation therapy 01/11/12- 02/28/12   right chest wall/supraclav fossa/drain site/scar boost   Mycosis fungoides    follows up with [facility] once to twice per year    Rosacea    S/P chemotherapy, time since 8-41 weeks    TMJ click    improved after changing the way dental exams were done   Urinary frequency    Past Surgical History:  Procedure Laterality Date   ABDOMINAL HYSTERECTOMY  1988   partial with on ovary remaining.   benign tumor removed from neck  [place]  05/19/11   left  and right breast   BREAST EXCISIONAL BIOPSY  1991   benign   CHOLECYSTECTOMY     MASTECTOMY  11/2011   [facility]at, [facility]   PORT-A-CATH REMOVAL Left 04/06/2012   Procedure: MINOR REMOVAL PORT-A-CATH;  Surgeon: [name], MD;  Location: [place];  Service: General;  Laterality: Left;   PORTACATH PLACEMENT  05/21/2011   Procedure: INSERTION PORT-A-CATH;  Surgeon: [name], MD;  Location: [facility];  Service: General;  Laterality: Left;   Patient Active Problem List   Diagnosis Date Noted   Vascular insufficiency of extremity 05/21/2019   Lung nodule seen on imaging study 05/24/2016   OAB (overactive bladder) 01/21/2016   Vaginal atrophy 01/21/2016   White coat syndrome without hypertension 06/26/2012   Pain of left calf 66/07/3014   TMJ click    S/P  chemotherapy, time since 4-12 weeks    Rosacea    History of external beam radiation therapy    Red blood cell antibody positive, compatible PRBC difficult to obtain 12/29/2011   Neoplastic disease 09/28/2011   Malignant neoplasm of upper-outer quadrant of right breast in female, estrogen receptor positive ([place]) 05/19/2011   Primary cancer of upper outer quadrant of right female breast ([place]) 05/06/2011    PCP: [name], MD  REFERRING PROVIDER: Benay Pike, MD  REFERRING DIAG: Left UE swelling  THERAPY DIAG:  Malignant neoplasm of upper-outer quadrant of left female breast, unspecified estrogen receptor status ([place])  Lymphedema, not elsewhere classified  ONSET DATE: 06/11/2021  Rationale for Evaluation and Treatment Rehabilitation  SUBJECTIVE  SUBJECTIVE STATEMENT: I feel like there might be something wrong on the Left. On May 4 I had a check up. I had blood drawn on the left at the [facility]. They didn't use a butterfly needle as she requested and the tech jabbed it in and left the tourniquet on longer.  When I got home my arm felt heavy and warm and I had some tingling in my fingers. No pain in arm or present problems.  PERTINENT HISTORY:    neoadjuvant treatment consisted of carboplatin/ docetaxel/ trastuzumab x6, started 06/02/2011 and completed 09/29/2011    status post bilateral mastectomies at [facility] October 2013 showing             (a) on the right, a residual 1.2 cm invasive ductal carcinoma in the breast, with 3 of 19 lymph nodes involved; an additional lymph node showed only isolated tumor cells, so this is a ypT1c ypN1 result             (b) on the left, there was a 1 mm area of residual invasive ductal carcinoma, with all 5 sentinel lymph nodes negative  (6) the patient  completed postmastectomy radiation to the right chest wall and right supraclavicular fossa 02/28/2012   PAIN:  Are you having pain? No  PRECAUTIONS: lymphedema risk  WEIGHT BEARING RESTRICTIONS No  FALLS:  Has patient fallen in last 6 months? No  LIVING ENVIRONMENT: Lives with: lives with their spouse Lives in: House/apartment Stairs: Yes; Internal: to attic and basement steps; yes rails OCCUPATION: not working  LEISURE: reading,  HAND DOMINANCE : right   PRIOR LEVEL OF FUNCTION: Independent  PATIENT GOALS concerned about high risk, be proactive, avoid Lymphedema   OBJECTIVE  COGNITION:  Overall cognitive status: Within functional limits for tasks assessed     OBSERVATIONS / OTHER ASSESSMENTS: No visible swelling, no measureable swelling, no pitting edema  POSTURE: forward head rounded shoulders    UPPER EXTREMITY STRENGTH: WFL    LYMPHEDEMA ASSESSMENTS:   SURGERY TYPE/DATE: 2013 bilateral mastectomies  NUMBER OF LYMPH NODES REMOVED: 3/19 on right, 0/5 on Left  CHEMOTHERAPY: yes  RADIATION:yes on right  HORMONE TREATMENT: yes  INFECTIONS: no  LYMPHEDEMA ASSESSMENTS:   LANDMARK RIGHT  Eval 07/01/2021  10 cm proximal to olecranon process 26.8  Olecranon process 21.8  10 cm proximal to ulnar styloid process 18.6  Just proximal to ulnar styloid process 13.6  Across hand at thumb web space 16.45  At base of 2nd digit 5.5  (Blank rows = not tested)  LANDMARK LEFT  Eval  10 cm proximal to olecranon process 26.6  Olecranon process 21.9  10 cm proximal to ulnar styloid process 18.0  Just proximal to ulnar styloid process 13.5  Across hand at thumb web space 16.3  At base of 2nd digit 5.4  (Blank rows = not tested)      TODAY'S TREATMENT  No treatment required. Pt is doing very well and has no signs of lymphedema. Discussed briefly precautions for preventing lymphedema/infection  PATIENT EDUCATION:  Education details: signs of infection,  skin care, precautions Person educated: Patient Education method: Explanation Education comprehension: verbalized understanding   HOME EXERCISE PROGRAM: NA  ASSESSMENT:  CLINICAL IMPRESSION: Patient is a 76 y.o. female who was seen today for physical therapy evaluation and treatment for concerns of  left UE swelling/lymphedema after having blood drawn and BP taken on the left side in early May. She had bilateral mastectomies performed in 2013 with 0/5 LN's on  the left and 3/19 LN's on the right. There is no visible or measureable signs of edema today . Briefly discussed precautions and  skin care and signs of infection.   OBJECTIVE IMPAIRMENTS none.   ACTIVITY LIMITATIONS none  PARTICIPATION LIMITATIONS: none  PERSONAL FACTORS Bilateral mastectomies with LN removal are also affecting patient's functional outcome.   REHAB POTENTIAL: Excellent  CLINICAL DECISION MAKING: Stable/uncomplicated  EVALUATION COMPLEXITY: Low  GOALS: Goals reviewed with patient? No, pt does not require treatment at this time    PT FREQUENCY:  no further appts set up. Pt does not have lymphedema, but visit helped alleviate pt. concerns  PT DURATION: 1 visit only  PLANNED INTERVENTIONS: Patient/Family education  PLAN FOR NEXT SESSION: No further sessions required. Pt is discharged. PHYSICAL THERAPY DISCHARGE SUMMARY  Visits from Start of Care: 1  Current functional level related to goals / functional outcomes: No further needs identified   Remaining deficits: none   Education / Equipment: Skin care, warning signs of infection   Patient agrees to discharge. Patient goals were  not made . Patient is being discharged due to  no further needs are identified.   Claris Pong, PT 07/01/2021, 8:50 AM

## 2021-07-01 ENCOUNTER — Ambulatory Visit: Payer: Medicare Other | Attending: Hematology and Oncology

## 2021-07-01 DIAGNOSIS — I890 Lymphedema, not elsewhere classified: Secondary | ICD-10-CM | POA: Insufficient documentation

## 2021-07-01 DIAGNOSIS — M7989 Other specified soft tissue disorders: Secondary | ICD-10-CM | POA: Diagnosis not present

## 2021-07-01 DIAGNOSIS — C50412 Malignant neoplasm of upper-outer quadrant of left female breast: Secondary | ICD-10-CM | POA: Insufficient documentation

## 2021-07-03 ENCOUNTER — Other Ambulatory Visit: Payer: Medicare Other

## 2021-08-31 DIAGNOSIS — L814 Other melanin hyperpigmentation: Secondary | ICD-10-CM | POA: Diagnosis not present

## 2021-08-31 DIAGNOSIS — L719 Rosacea, unspecified: Secondary | ICD-10-CM | POA: Diagnosis not present

## 2021-08-31 DIAGNOSIS — D225 Melanocytic nevi of trunk: Secondary | ICD-10-CM | POA: Diagnosis not present

## 2021-08-31 DIAGNOSIS — Z808 Family history of malignant neoplasm of other organs or systems: Secondary | ICD-10-CM | POA: Diagnosis not present

## 2021-08-31 DIAGNOSIS — L821 Other seborrheic keratosis: Secondary | ICD-10-CM | POA: Diagnosis not present

## 2021-08-31 DIAGNOSIS — L578 Other skin changes due to chronic exposure to nonionizing radiation: Secondary | ICD-10-CM | POA: Diagnosis not present

## 2021-09-01 ENCOUNTER — Encounter: Payer: Self-pay | Admitting: Family Medicine

## 2021-09-01 ENCOUNTER — Ambulatory Visit (INDEPENDENT_AMBULATORY_CARE_PROVIDER_SITE_OTHER): Payer: Medicare Other | Admitting: Family Medicine

## 2021-09-01 VITALS — BP 136/68 | HR 76 | Temp 98.1°F | Ht 60.0 in | Wt 119.3 lb

## 2021-09-01 DIAGNOSIS — R739 Hyperglycemia, unspecified: Secondary | ICD-10-CM

## 2021-09-01 DIAGNOSIS — Z780 Asymptomatic menopausal state: Secondary | ICD-10-CM | POA: Diagnosis not present

## 2021-09-01 DIAGNOSIS — Z853 Personal history of malignant neoplasm of breast: Secondary | ICD-10-CM | POA: Diagnosis not present

## 2021-09-01 DIAGNOSIS — Z9189 Other specified personal risk factors, not elsewhere classified: Secondary | ICD-10-CM | POA: Diagnosis not present

## 2021-09-01 DIAGNOSIS — R7303 Prediabetes: Secondary | ICD-10-CM | POA: Diagnosis not present

## 2021-09-01 DIAGNOSIS — I10 Essential (primary) hypertension: Secondary | ICD-10-CM | POA: Diagnosis not present

## 2021-09-01 DIAGNOSIS — C773 Secondary and unspecified malignant neoplasm of axilla and upper limb lymph nodes: Secondary | ICD-10-CM | POA: Insufficient documentation

## 2021-09-01 LAB — POCT GLYCOSYLATED HEMOGLOBIN (HGB A1C): Hemoglobin A1C: 5.8 % — AB (ref 4.0–5.6)

## 2021-09-01 NOTE — Assessment & Plan Note (Signed)
Patient is concerned that her fasting blood glucose is increasing. Pt reports she has a significant family history of diabetes and would like to be screened for it today. A1C in office

## 2021-09-01 NOTE — Assessment & Plan Note (Signed)
BP today in office is well controlled, on olmesartan 5 mg daily. I reviewed her most recent bloodwork from her oncologist's office

## 2021-09-01 NOTE — Assessment & Plan Note (Signed)
Pt reports she is due for her DEXA scan, will order this today.

## 2021-09-01 NOTE — Assessment & Plan Note (Signed)
NEW diagnosis today in office. I gave the patient handouts on healthy eating and I recommended increasing exercise to at least 30 minutes daily of moderate walking. All of the patient's questions were answered and I will have her return to the office in 6 months for another A1C check.  I advised that if she feels overwhelmed then to contact the office and I will send her to a dietician for education.

## 2021-09-01 NOTE — Progress Notes (Addendum)
New Patient Office Visit  Subjective    Patient ID: Kirsten Gibson, female    DOB: [birth year]  Age: 76 y.o. MRN: [id number]  CC:  Chief Complaint  Patient presents with   Establish Care    HPI [name] presents to establish care Patient is here as a new patient. Was previously seeing Dr. Stephanie Acre and her last visit was in May 2023 for her annual exam. States that she has a history of cancer. She had a double mastectomy in 2013 and underwent chemo, has been in remission for the last 10 years.  HTN - pt reports she was recently started on olmesartan 5 mg daily for her BP. She reports she checks her BP at home daily and is it usually in the 120's/ 80's range. Reports no chest pain, no SOB or headaches. Denies any dizziness or lightheadedness. States she is doing well on the medication, no side effects reported.  Patient also has concerns about her blood sugar today. She states that she has noticed that her fasting blood sugar is trending up on her past labs,and she has a very strong family history of diabetes, in her siblings and her parents. She states that she would like to be screened today for this.  Her last A1C check was 5.6 in 2021. Today her A1C is 5.8.  Outpatient Encounter Medications as of 09/01/2021  Medication Sig   calcium carbonate (OS-CAL) 600 MG TABS Take 750 mg by mouth daily.    cholecalciferol (VITAMIN D3) 25 MCG (1000 UNIT) tablet Take 1,000 Units by mouth daily.   Multiple Vitamins-Minerals (MULTIVITAMIN PO) Take 1 capsule by mouth daily.   olmesartan (BENICAR) 5 MG tablet Take by mouth daily.   [DISCONTINUED] aspirin EC 81 MG tablet Take 1 tablet (81 mg total) by mouth once a week. (Patient not taking: Reported on 07/01/2021)   [DISCONTINUED] gabapentin (NEURONTIN) 100 MG capsule Take 3 capsules (300 mg total) by mouth at bedtime. (Patient not taking: Reported on 07/01/2021)   [DISCONTINUED] tamoxifen (NOLVADEX) 20 MG tablet TAKE 1 TABLET(20 MG) BY MOUTH DAILY  (Patient not taking: Reported on 07/01/2021)   [DISCONTINUED] zolpidem (AMBIEN) 5 MG tablet TAKE 1 TABLET BY MOUTH EVERY DAY AT BEDTIME (Patient not taking: Reported on 07/01/2021)   No facility-administered encounter medications on file as of 09/01/2021.    Past Medical History:  Diagnosis Date   Anemia due to chemotherapy 08/23/2011   Anxiety    Breast cancer ([place])    bilateral infiltrating ductal carcinoma   Hx of radiation therapy 01/11/12- 02/28/12   right chest wall/supraclav fossa/drain site/scar boost   Mycosis fungoides    follows up with [facility] once to twice per year    Rosacea    S/P chemotherapy, time since 1-49 weeks    TMJ click    improved after changing the way dental exams were done   Urinary frequency     Past Surgical History:  Procedure Laterality Date   ABDOMINAL HYSTERECTOMY  1988   partial with on ovary remaining.   benign tumor removed from neck  [place]  05/19/11   left  and right breast   BREAST EXCISIONAL BIOPSY  1991   benign   CHOLECYSTECTOMY     MASTECTOMY  11/2011   bilat, [facility]   PORT-A-CATH REMOVAL Left 04/06/2012   Procedure: MINOR REMOVAL PORT-A-CATH;  Surgeon: [name], MD;  Location: [place];  Service: General;  Laterality: Left;  PORTACATH PLACEMENT  05/21/2011   Procedure: INSERTION PORT-A-CATH;  Surgeon: [name], MD;  [facility];  Service: General;  Laterality: Left;    Family History  Problem Relation Age of Onset   Cancer Maternal Aunt        breast to brain   Cancer Paternal Grandmother        breast and/or ovarian///unknown   Stomach cancer Paternal Grandfather        stomach   Colon cancer Neg Hx     Social History   Socioeconomic History   Marital status: Married    Spouse name: Not on file   Number of children: Not on file   Years of education: Not on file   Highest education level: Not on file  Occupational History   Not on file  Tobacco Use    Smoking status: Former    Packs/day: 1.00    Years: 20.00    Total pack years: 20.00    Types: Cigarettes    Quit date: 02/08/1986    Years since quitting: 35.6   Smokeless tobacco: Never  Substance and Sexual Activity   Alcohol use: No   Drug use: No   Sexual activity: Yes    Birth control/protection: Post-menopausal  Other Topics Concern   Not on file  Social History Narrative   Not on file   Social Determinants of Health   Financial Resource Strain: Not on file  Food Insecurity: Not on file  Transportation Needs: Not on file  Physical Activity: Not on file  Stress: Not on file  Social Connections: Not on file  Intimate Partner Violence: Not on file    Review of Systems  All other systems reviewed and are negative.   Objective    BP 136/68   Pulse 76   Temp 98.1 F (36.7 C) (Oral)   Ht 5' (1.524 m)   Wt 119 lb 4.8 oz (54.1 kg)   LMP 02/09/1996   SpO2 96%   BMI 23.30 kg/m   Physical Exam Vitals reviewed.  Constitutional:      Appearance: Normal appearance. She is well-groomed and normal weight.  HENT:     Head: Normocephalic and atraumatic.     Mouth/Throat:     Mouth: Mucous membranes are moist.     Pharynx: Oropharynx is clear.  Eyes:     Extraocular Movements: Extraocular movements intact.     Conjunctiva/sclera: Conjunctivae normal.     Pupils: Pupils are equal, round, and reactive to light.  Cardiovascular:     Rate and Rhythm: Normal rate and regular rhythm.     Pulses: Normal pulses.     Heart sounds: S1 normal and S2 normal.  Pulmonary:     Effort: Pulmonary effort is normal.     Breath sounds: Normal breath sounds and air entry.  Abdominal:     General: Abdomen is flat. Bowel sounds are normal.     Palpations: Abdomen is soft.  Musculoskeletal:        General: Normal range of motion.     Cervical back: Normal range of motion and neck supple.     Right lower leg: No edema.     Left lower leg: No edema.  Skin:    General: Skin is warm  and dry.  Neurological:     Mental Status: She is alert and oriented to person, place, and time. Mental status is at baseline.     Gait: Gait is intact.  Psychiatric:  Mood and Affect: Mood and affect normal.        Speech: Speech normal.        Behavior: Behavior normal.        Judgment: Judgment normal.         Assessment & Plan:   Problem List Items Addressed This Visit       Cardiovascular and Mediastinum   HTN (hypertension), benign - Primary    BP today in office is well controlled, on olmesartan 5 mg daily. I reviewed her most recent bloodwork from her oncologist's office        Other   Postmenopausal state    Pt reports she is due for her DEXA scan, will order this today.      Relevant Orders   DG Bone Density   Prediabetes    NEW diagnosis today in office. I gave the patient handouts on healthy eating and I recommended increasing exercise to at least 30 minutes daily of moderate walking. All of the patient's questions were answered and I will have her return to the office in 6 months for another A1C check.  I advised that if she feels overwhelmed then to contact the office and I will send her to a dietician for education.      Personal history of malignant neoplasm of breast ([place])    Stable, has been in remission for >10 years.      RESOLVED: At risk for hyperglycemia    Patient is concerned that her fasting blood glucose is increasing. Pt reports she has a significant family history of diabetes and would like to be screened for it today. A1C in office      Other Visit Diagnoses     Hyperglycemia       Relevant Orders   POC HgB A1c (Completed)       Return in about 6 months (around 03/04/2022) for prediabetes A1C.   Farrel Conners, MD

## 2021-09-07 ENCOUNTER — Telehealth: Payer: Self-pay | Admitting: Family Medicine

## 2021-09-07 DIAGNOSIS — R739 Hyperglycemia, unspecified: Secondary | ICD-10-CM

## 2021-09-07 DIAGNOSIS — Z853 Personal history of malignant neoplasm of breast: Secondary | ICD-10-CM | POA: Insufficient documentation

## 2021-09-07 NOTE — Telephone Encounter (Signed)
Left a detailed message at the patient's home number stating the dietician referral entered as below and someone will contact her with appt info.

## 2021-09-07 NOTE — Assessment & Plan Note (Signed)
Stable, has been in remission for >10 years.

## 2021-09-07 NOTE — Telephone Encounter (Signed)
Pt is requesting a referral to see:  Dietician: [facility] [phone]  Please advise [phone]

## 2021-11-12 ENCOUNTER — Ambulatory Visit (INDEPENDENT_AMBULATORY_CARE_PROVIDER_SITE_OTHER): Payer: Medicare Other | Admitting: *Deleted

## 2021-11-12 DIAGNOSIS — Z23 Encounter for immunization: Secondary | ICD-10-CM | POA: Diagnosis not present

## 2021-11-23 ENCOUNTER — Telehealth: Payer: Self-pay | Admitting: Family Medicine

## 2021-11-23 DIAGNOSIS — R739 Hyperglycemia, unspecified: Secondary | ICD-10-CM

## 2021-11-23 NOTE — Telephone Encounter (Signed)
Spoke with the patient and scheduled an appt on 10/26 per patient's request.

## 2021-11-23 NOTE — Telephone Encounter (Signed)
Ok to order the A1C

## 2021-11-23 NOTE — Telephone Encounter (Signed)
Pt requesting an A1C check. Recently diagnosed pre diabetic. Requests a call to let her know if the test will be ordered

## 2021-12-03 ENCOUNTER — Other Ambulatory Visit: Payer: Medicare Other

## 2021-12-03 DIAGNOSIS — R739 Hyperglycemia, unspecified: Secondary | ICD-10-CM

## 2021-12-03 LAB — HEMOGLOBIN A1C: Hgb A1c MFr Bld: 6 % (ref 4.6–6.5)

## 2021-12-07 ENCOUNTER — Telehealth: Payer: Self-pay | Admitting: Family Medicine

## 2021-12-07 NOTE — Telephone Encounter (Signed)
Pt is calling and would like a a1c result

## 2021-12-08 NOTE — Telephone Encounter (Signed)
No need for referral at this time, I just recommend reducing sugar and starches in her diet, eat more green veggies and protein. We will recheck this at her next visit in January

## 2021-12-08 NOTE — Telephone Encounter (Signed)
It was 6.0 last week.

## 2021-12-08 NOTE — Telephone Encounter (Signed)
Patient informed of the results.  Patient states since the number is elevated, she questioned if she needs to see an endocrinologist?  Message sent to PCP.

## 2021-12-09 NOTE — Telephone Encounter (Signed)
Patient informed of the message below.

## 2022-01-11 ENCOUNTER — Telehealth: Payer: Self-pay | Admitting: Family Medicine

## 2022-01-11 ENCOUNTER — Other Ambulatory Visit: Payer: Self-pay | Admitting: Family Medicine

## 2022-01-11 DIAGNOSIS — R7303 Prediabetes: Secondary | ICD-10-CM

## 2022-01-11 MED ORDER — BLOOD GLUCOSE TEST STRIPS 333 VI STRP: 1.0000 | ORAL_STRIP | Freq: Every day | 3 refills | Status: AC

## 2022-01-11 NOTE — Telephone Encounter (Signed)
Message sent to PCP as Rx is not on the current medication list.

## 2022-01-11 NOTE — Telephone Encounter (Signed)
Rx sent 

## 2022-01-11 NOTE — Telephone Encounter (Signed)
Pt called to say she needs a pack of 100, instead of the 25.  This is because Pharmacy is telling her it is easier to get a pack of 100.

## 2022-01-11 NOTE — Telephone Encounter (Signed)
Patient requesting test strips for her Freestyle Lite, requesting a pack of 25 with refills.

## 2022-02-15 DIAGNOSIS — K130 Diseases of lips: Secondary | ICD-10-CM | POA: Diagnosis not present

## 2022-03-04 ENCOUNTER — Ambulatory Visit (INDEPENDENT_AMBULATORY_CARE_PROVIDER_SITE_OTHER): Payer: Medicare Other | Admitting: Family Medicine

## 2022-03-04 ENCOUNTER — Encounter: Payer: Self-pay | Admitting: Family Medicine

## 2022-03-04 VITALS — BP 132/84 | HR 76 | Temp 98.9°F | Ht 60.0 in | Wt 103.0 lb

## 2022-03-04 DIAGNOSIS — R7303 Prediabetes: Secondary | ICD-10-CM | POA: Diagnosis not present

## 2022-03-04 DIAGNOSIS — M8580 Other specified disorders of bone density and structure, unspecified site: Secondary | ICD-10-CM

## 2022-03-04 DIAGNOSIS — I10 Essential (primary) hypertension: Secondary | ICD-10-CM

## 2022-03-04 DIAGNOSIS — M85851 Other specified disorders of bone density and structure, right thigh: Secondary | ICD-10-CM

## 2022-03-04 DIAGNOSIS — Z780 Asymptomatic menopausal state: Secondary | ICD-10-CM | POA: Diagnosis not present

## 2022-03-04 NOTE — Progress Notes (Signed)
Established Patient Office Visit  Subjective   Patient ID: Kirsten Gibson, female    DOB: 03/16/1945  Age: 77 y.o. MRN: [id number]  Chief Complaint  Patient presents with   Medication Management    Patient is here for follow up.  HTN-- patient is reporting that her blood pressure is running low at home so she stopped taking her BP medication at home. She has been monitoring her BP at home daily and brought in her BP results and we reviewed them together. BP in office today is 132/84 without her medication.   PreDM-- patient reports she is concerned about her blood sugars. She reports she was so nervous after the appointment that she reduced her eating and has lost weight since her last appointment. We had a long discussion about foods and which ones are listed on the diabetic diet, handout was given to patient.    Current Outpatient Medications  Medication Instructions   calcium carbonate (OS-CAL) 750 mg, Oral, Daily   cholecalciferol (VITAMIN D3) 1,000 Units, Oral, Daily   Glucose Blood (BLOOD GLUCOSE TEST STRIPS 333) STRP 1 strip, In Vitro, Daily   Multiple Vitamins-Minerals (MULTIVITAMIN PO) 1 capsule, Daily   olmesartan (BENICAR) 5 MG tablet Oral, Daily     Patient Active Problem List   Diagnosis Date Noted   Personal history of malignant neoplasm of breast 09/07/2021   Secondary and unspecified malignant neoplasm of axilla and upper limb lymph nodes ([place]) 09/01/2021   HTN (hypertension), benign 09/01/2021   Postmenopausal state 09/01/2021   Prediabetes 09/01/2021   Vascular insufficiency of extremity 05/21/2019   Lung nodule seen on imaging study 05/24/2016   OAB (overactive bladder) 01/21/2016   Vaginal atrophy 01/21/2016   White coat syndrome without hypertension 06/26/2012   Pain of left calf 19/50/9326   TMJ click    S/P chemotherapy, time since 4-12 weeks    Rosacea    History of external beam radiation therapy    Red blood cell antibody positive, compatible PRBC  difficult to obtain 12/29/2011   Neoplastic disease 09/28/2011   Malignant neoplasm of upper-outer quadrant of right breast in female, estrogen receptor positive ([place]) 05/19/2011   Primary cancer of upper outer quadrant of right female breast ([place]) 05/06/2011      Review of Systems  All other systems reviewed and are negative.     Objective:     BP 132/84 (BP Location: Right Arm) Comment: BP performed by PCP--jaf  Pulse 76   Temp 98.9 F (37.2 C) (Oral)   Ht 5' (1.524 m)   Wt 103 lb (46.7 kg)   LMP 02/09/1996   SpO2 98%   BMI 20.12 kg/m    Physical Exam Vitals reviewed.  Constitutional:      Appearance: Normal appearance. She is well-groomed and normal weight.  Eyes:     Conjunctiva/sclera: Conjunctivae normal.  Neck:     Thyroid: No thyromegaly.  Cardiovascular:     Rate and Rhythm: Normal rate and regular rhythm.     Pulses: Normal pulses.     Heart sounds: S1 normal and S2 normal.  Pulmonary:     Effort: Pulmonary effort is normal.     Breath sounds: Normal breath sounds and air entry.  Abdominal:     General: Bowel sounds are normal.  Musculoskeletal:     Right lower leg: No edema.     Left lower leg: No edema.  Neurological:     Mental Status: She is alert and oriented to  person, place, and time. Mental status is at baseline.     Gait: Gait is intact.  Psychiatric:        Mood and Affect: Mood and affect normal.        Speech: Speech normal.        Behavior: Behavior normal.        Judgment: Judgment normal.       The 10-year ASCVD risk score (Arnett DK, et al., 2019) is: 22.5%    Assessment & Plan:   Problem List Items Addressed This Visit       Unprioritized   HTN (hypertension), benign - Primary    Patient has stopped her benicar 5 mg daily, BP at home appears WNL and today in office is WNL. She may continue to monitor her BP at home daily and if it increases to >140/90 she should resume her medication.       Postmenopausal state    Relevant Orders   DG Bone Density   Prediabetes    Will recheck A1C today, pt prefers a blood draw so the test was ordered. I advised her on healthy eating and moderate exercise. Handouts given again for advice. I advised that she should not lose any more weight, BMI is down to 20 today, I gave her advice on calorie consumption also.       Relevant Orders   Hemoglobin A1c (Completed)   Other Visit Diagnoses     Osteopenia, unspecified location       Relevant Orders   Vitamin D, 25-hydroxy (Completed)   Other specified disorders of bone density and structure, right thigh       Relevant Orders   Vitamin D, 25-hydroxy (Completed)      I spent 30 minutes with the patient today giving education and counseling for her prediabetes and discussing her HTN medication and management.  Return in about 6 months (around 09/02/2022) for follow up .    Farrel Conners, MD

## 2022-03-04 NOTE — Patient Instructions (Signed)
Total calories intake 1800 per day  Total Carb (sugar and starches) around 90-100 grams per day  Total protein intake should be at least 50-60 grams per day  Total fat intake around 40-50 grams per day

## 2022-03-05 LAB — HEMOGLOBIN A1C: Hgb A1c MFr Bld: 5.9 % (ref 4.6–6.5)

## 2022-03-05 LAB — VITAMIN D 25 HYDROXY (VIT D DEFICIENCY, FRACTURES): VITD: 51.24 ng/mL (ref 30.00–100.00)

## 2022-03-05 NOTE — Assessment & Plan Note (Signed)
Patient has stopped her benicar 5 mg daily, BP at home appears WNL and today in office is WNL. She may continue to monitor her BP at home daily and if it increases to >140/90 she should resume her medication.

## 2022-03-05 NOTE — Assessment & Plan Note (Signed)
Will recheck A1C today, pt prefers a blood draw so the test was ordered. I advised her on healthy eating and moderate exercise. Handouts given again for advice. I advised that she should not lose any more weight, BMI is down to 20 today, I gave her advice on calorie consumption also.

## 2022-03-08 ENCOUNTER — Telehealth: Payer: Self-pay | Admitting: Family Medicine

## 2022-03-08 NOTE — Telephone Encounter (Signed)
Left message for patient to call back and schedule Medicare Annual Wellness Visit (AWV) either virtually or in office. Left  my Herbie Drape number [phone]  awvS 06/09/22 per [facility]  please schedule with Nurse Health Adviser   45 min for awv-i  in office appointments 30 min for awv-s & awv-i phone/virtual appointments     Patient left message 03/05/22  wanting to schedule awv on

## 2022-03-12 ENCOUNTER — Telehealth: Payer: Self-pay | Admitting: Family Medicine

## 2022-03-12 NOTE — Telephone Encounter (Signed)
Pt is calling and would like A1c and vit d results

## 2022-03-12 NOTE — Telephone Encounter (Signed)
Pt is calling and has did call [facility] and they per pt could not tell which type of bone density she is sch for. Pt would like to have have bone density test that check her spinal in addition to hip etc.

## 2022-03-12 NOTE — Telephone Encounter (Signed)
I think they include both on the bone density but I don't know how I would find out that information,.Marland KitchenMarland Kitchen

## 2022-03-15 NOTE — Telephone Encounter (Signed)
Patient informed of the message below.

## 2022-03-15 NOTE — Telephone Encounter (Signed)
Vitamin D is in the normal range at 51 and A1C is good at 5.9. it shows some mildly elevated blood sugar but nothing changed since the last A1C was checked. Medication not needed at this time. I would have her continue her 1000 units daily of vitamin D

## 2022-03-15 NOTE — Telephone Encounter (Signed)
Patient informed of the message below and was advised to check with the actual imaging center where the test will be performed.

## 2022-03-15 NOTE — Telephone Encounter (Signed)
Patient informed of the results as below.  Message sent as patient wants to know if she is stuck in this stage for the prediabetic range?  States she has changed her diet.

## 2022-03-15 NOTE — Telephone Encounter (Signed)
It may be genetic for her really. I would try to reassure her that it is not severe enough to cause any significant medical complications.

## 2022-03-18 ENCOUNTER — Ambulatory Visit (HOSPITAL_BASED_OUTPATIENT_CLINIC_OR_DEPARTMENT_OTHER)
Admission: RE | Admit: 2022-03-18 | Discharge: 2022-03-18 | Disposition: A | Discharge status: Home / Self Care | Payer: Medicare Other | Source: Ambulatory Visit | Attending: Family Medicine | Admitting: Family Medicine

## 2022-03-18 DIAGNOSIS — M810 Age-related osteoporosis without current pathological fracture: Secondary | ICD-10-CM | POA: Diagnosis not present

## 2022-03-18 DIAGNOSIS — Z780 Asymptomatic menopausal state: Secondary | ICD-10-CM | POA: Insufficient documentation

## 2022-04-01 ENCOUNTER — Telehealth: Payer: Self-pay

## 2022-04-01 NOTE — Patient Outreach (Signed)
  Care Coordination   Initial Visit Note   04/01/2022 Name: Kirsten Gibson MRN: [id number] DOB: October 22, 1945  Kirsten Gibson is a 77 y.o. year old female who sees Farrel Conners, MD for primary care. I spoke with  Lavena Stanford Pickrel by phone today.  What matters to the patients health and wellness today?  Prediabetes questions    Goals Addressed             This Visit's Progress    Pre-diabetes Management       Patient Goals/Self Care Activities: -Patient/Caregiver will self-administer medications as prescribed as evidenced by self-report/primary caregiver report  -Patient/Caregiver will attend all scheduled provider appointments as evidenced by clinician review of documented attendance to scheduled appointments and patient/caregiver report  Patient Goals/Self Care Activities: -record values and write them down take them to all doctor visits  Limit carbohydrates and sweets  Interventions Today    Flowsheet Row Most Recent Value  Chronic Disease   Chronic disease during today's visit Diabetes  General Interventions   General Interventions Discussed/Reviewed General Interventions Discussed, Doctor Visits  Doctor Visits Discussed/Reviewed Doctor Visits Discussed, Annual Wellness Visits  Exercise Interventions   Exercise Discussed/Reviewed Exercise Discussed  Education Interventions   Education Provided Provided Education  Provided Verbal Education On Medication  Nutrition Interventions   Nutrition Discussed/Reviewed Carbohydrate meal planning, Decreasing sugar intake             SDOH assessments and interventions completed:  Yes  SDOH Interventions Today    Flowsheet Row Most Recent Value  SDOH Interventions   Food Insecurity Interventions Intervention Not Indicated  Housing Interventions Intervention Not Indicated        Care Coordination Interventions:  Yes, provided   Follow up plan: Follow up call scheduled for 04/06/22    Encounter Outcome:  Pt. Visit  Completed   Jone Baseman, RN, MSN [place] [facility] Care Management Coordinator Direct Line [phone]

## 2022-04-01 NOTE — Patient Instructions (Signed)
Visit Information  Thank you for taking time to visit with me today. Please don't hesitate to contact me if I can be of assistance to you.   Following are the goals we discussed today:   Goals Addressed             This Visit's Progress    Pre-diabetes Management       Patient Goals/Self Care Activities: -Patient/Caregiver will self-administer medications as prescribed as evidenced by self-report/primary caregiver report  -Patient/Caregiver will attend all scheduled provider appointments as evidenced by clinician review of documented attendance to scheduled appointments and patient/caregiver report  Patient Goals/Self Care Activities: -record values and write them down take them to all doctor visits  Limit carbohydrates and sweets  Interventions Today    Flowsheet Row Most Recent Value  Chronic Disease   Chronic disease during today's visit Diabetes  General Interventions   General Interventions Discussed/Reviewed General Interventions Discussed, Doctor Visits  Doctor Visits Discussed/Reviewed Doctor Visits Discussed, Annual Wellness Visits  Exercise Interventions   Exercise Discussed/Reviewed Exercise Discussed  Education Interventions   Education Provided Provided Education  Provided Verbal Education On Medication  Nutrition Interventions   Nutrition Discussed/Reviewed Carbohydrate meal planning, Decreasing sugar intake             Our next appointment is by telephone on 04/06/22 at 1000  Please call the care guide team at [phone] if you need to cancel or reschedule your appointment.   If you are experiencing a Mental Health or [place] or need someone to talk to, please call the Suicide and Crisis Lifeline: 988   The patient verbalized understanding of instructions, educational materials, and care plan provided today and agreed to receive a mailed copy of patient instructions, educational materials, and care plan.   The patient has been  provided with contact information for the care management team and has been advised to call with any health related questions or concerns.   Jone Baseman, RN, MSN [place] [facility] Care Management Coordinator Direct Line [phone]

## 2022-04-06 ENCOUNTER — Ambulatory Visit: Payer: Self-pay

## 2022-04-06 NOTE — Patient Instructions (Signed)
Visit Information  Thank you for taking time to visit with me today. Please don't hesitate to contact me if I can be of assistance to you.   Following are the goals we discussed today:   Goals Addressed             This Visit's Progress    Pre-diabetes Management       Patient Goals/Self Care Activities: -Patient/Caregiver will self-administer medications as prescribed as evidenced by self-report/primary caregiver report  -Patient/Caregiver will attend all scheduled provider appointments as evidenced by clinician review of documented attendance to scheduled appointments and patient/caregiver report  Patient Goals/Self Care Activities: -record values and write them down take them to all doctor visits  Limit carbohydrates and sweets  Interventions Today    Flowsheet Row Most Recent Value  Chronic Disease   Chronic disease during today's visit Diabetes  General Interventions   General Interventions Discussed/Reviewed General Interventions Reviewed  Education Interventions   Education Provided Provided Education  Provided Verbal Education On Blood Sugar Monitoring              If you are experiencing a Mental Health or [place] or need someone to talk to, please call the Suicide and Crisis Lifeline: 988   Patient verbalizes understanding of instructions and care plan provided today and agrees to view in [place]. Active MyChart status and patient understanding of how to access instructions and care plan via MyChart confirmed with patient.     The patient has been provided with contact information for the care management team and has been advised to call with any health related questions or concerns.   Jone Baseman, RN, MSN [place] [facility] Care Management Coordinator Direct Line [phone]

## 2022-04-06 NOTE — Patient Outreach (Signed)
  Care Coordination   Follow Up Visit Note   04/06/2022 Name: ANASTASI KEGG MRN: [id number] DOB: [birth year]  LARENA DESAULNIERS is a 77 y.o. year old female who sees Farrel Conners, MD for primary care. I spoke with  Lavena Stanford Comins by phone today.  What matters to the patients health and wellness today?  Prediabetes management    Goals Addressed             This Visit's Progress    Pre-diabetes Management       Patient Goals/Self Care Activities: -Patient/Caregiver will self-administer medications as prescribed as evidenced by self-report/primary caregiver report  -Patient/Caregiver will attend all scheduled provider appointments as evidenced by clinician review of documented attendance to scheduled appointments and patient/caregiver report  Patient Goals/Self Care Activities: -record values and write them down take them to all doctor visits  Limit carbohydrates and sweets  Interventions Today    Flowsheet Row Most Recent Value  Chronic Disease   Chronic disease during today's visit Diabetes  General Interventions   General Interventions Discussed/Reviewed General Interventions Reviewed  Education Interventions   Education Provided Provided Education  Provided Verbal Education On Blood Sugar Monitoring             SDOH assessments and interventions completed:  Yes     Care Coordination Interventions:  Yes, provided   Follow up plan:  Patient to call to reschedule    Encounter Outcome:  Pt. Visit Completed   Jone Baseman, RN, MSN [employer] [facility] Care Management Coordinator Direct Line [phone]

## 2022-04-27 ENCOUNTER — Telehealth: Payer: Self-pay | Admitting: Dietician

## 2022-04-27 ENCOUNTER — Telehealth: Payer: Self-pay

## 2022-04-27 NOTE — Telephone Encounter (Signed)
Returned patient call.  She has questions regarding the Type 2 Diabetes Support Group. Chart Reviewed.  A1C 5.9% 02/2022 which meets criteria for prediabetes.   She states that she has been avoiding peas and potatoes and choosing TV dinners at times (due to not feeling like cooking but also to choose foods with known low carbohydrate).  She states that she is concerned that her potato intake has caused her A1C to increase.  She reports that her weight is about 100 lbs which she states is thin for herself.  She states that she is fearful about exercise as she does not want to lose weight.  She has a strong family history of diabetes (type 2 on insulin per patient).   She asked about CGM "Libre".  Discussed that this would not be necessary for patient.  Feel that this may make patient overly restrictive in her eating habits.   Patient is also concerned about osteoporosis.  Discussed that she can ask for a referral from her MD for a nutrition visit at our office.  With prediabetes, this is likely not covered by insurance.  I do not believe the DPP program would be a good fit for her at this time.  She should not attend the Type 2 Diabetes support group as she does not have diabetes.  Feel patient is being overly restrictive in her eating which is limiting her physical activity and causing weight loss.  Recommended that she eat real foods, balanced meals, limit saturated fat, and eat adequate amounts to be active and maintain a healthy weight.  Discussed goal with prediabetes to exercise 30 minutes most days (within her limitations).  This may be walking, strength or balance training.   Antonieta Iba, RD, LDN, [facility] [phone]

## 2022-04-27 NOTE — Patient Outreach (Signed)
  Care Coordination   Follow Up Visit Note   04/27/2022 Name: Kirsten Gibson MRN: [id number] DOB: [birth year]  Kirsten Gibson is a 77 y.o. year old female who sees Farrel Conners, MD for primary care. I spoke with  Lavena Stanford Kaczynski by phone today.  What matters to the patients health and wellness today?  Prediabetes management    Goals Addressed             This Visit's Progress    Pre-diabetes Management       Patient Goals/Self Care Activities: -Patient/Caregiver will self-administer medications as prescribed as evidenced by self-report/primary caregiver report  -Patient/Caregiver will attend all scheduled provider appointments as evidenced by clinician review of documented attendance to scheduled appointments and patient/caregiver report  Patient Goals/Self Care Activities: -record values and write them down take them to all doctor visits  Limit carbohydrates and sweets  Interventions Today    Flowsheet Row Most Recent Value  Chronic Disease   Chronic disease during today's visit Diabetes  General Interventions   General Interventions Discussed/Reviewed General Interventions Reviewed  Education Interventions   Education Provided Provided Education             SDOH assessments and interventions completed:  Yes     Care Coordination Interventions:  Yes, provided   Follow up plan: Follow up call scheduled for May    Encounter Outcome:  Pt. Visit Completed   Jone Baseman, RN, MSN [place] [facility] Care Management Coordinator Direct Line [phone]

## 2022-04-27 NOTE — Patient Instructions (Signed)
Visit Information  Thank you for taking time to visit with me today. Please don't hesitate to contact me if I can be of assistance to you.   Following are the goals we discussed today:   Goals Addressed             This Visit's Progress    Pre-diabetes Management       Patient Goals/Self Care Activities: -Patient/Caregiver will self-administer medications as prescribed as evidenced by self-report/primary caregiver report  -Patient/Caregiver will attend all scheduled provider appointments as evidenced by clinician review of documented attendance to scheduled appointments and patient/caregiver report  Patient Goals/Self Care Activities: -record values and write them down take them to all doctor visits  Limit carbohydrates and sweets  Interventions Today    Flowsheet Row Most Recent Value  Chronic Disease   Chronic disease during today's visit Diabetes  General Interventions   General Interventions Discussed/Reviewed General Interventions Reviewed  Education Interventions   Education Provided Provided Education             Our next appointment is by telephone on 07/06/22 at 1100  Please call the care guide team at [phone] if you need to cancel or reschedule your appointment.   If you are experiencing a Mental Health or [place] or need someone to talk to, please call the Suicide and Crisis Lifeline: 988   Patient verbalizes understanding of instructions and care plan provided today and agrees to view in [place]. Active MyChart status and patient understanding of how to access instructions and care plan via MyChart confirmed with patient.     The patient has been provided with contact information for the care management team and has been advised to call with any health related questions or concerns.   Jone Baseman, RN, MSN [place] [facility] Care Management Coordinator Direct Line [phone]

## 2022-05-19 ENCOUNTER — Telehealth: Payer: Self-pay | Admitting: Family Medicine

## 2022-05-19 DIAGNOSIS — R7303 Prediabetes: Secondary | ICD-10-CM

## 2022-05-19 DIAGNOSIS — I10 Essential (primary) hypertension: Secondary | ICD-10-CM

## 2022-05-19 DIAGNOSIS — Z13220 Encounter for screening for lipoid disorders: Secondary | ICD-10-CM

## 2022-05-19 NOTE — Telephone Encounter (Signed)
Called patient to schedule Medicare Annual Wellness Visit (AWV). Left message for patient to call back and schedule Medicare Annual Wellness Visit (AWV).  Last date of AWV: 06/17/20  Please schedule an appointment at any time with [facility] or [name].  If any questions, please contact me at [phone].  Thank you ,  Rudell Cobb AWV direct phone # [phone]

## 2022-05-19 NOTE — Telephone Encounter (Signed)
Spoke with patient to schedule her AWV with [facility].   Patient scheduled 06/16/22  she wanted to know if it is time for her to have A1C labs done.  If so can she have at this appointment.  FYI  Patient also canceled her 06/29/22 cpe appointment

## 2022-05-20 NOTE — Telephone Encounter (Signed)
Yes I can place labs in the computer and beverly can see her that day- she will ned to be fasting when she comes in

## 2022-05-26 NOTE — Telephone Encounter (Signed)
Patient aware she can have labs

## 2022-06-14 ENCOUNTER — Inpatient Hospital Stay: Payer: Medicare Other | Attending: Hematology and Oncology | Admitting: Hematology and Oncology

## 2022-06-14 NOTE — Progress Notes (Deleted)
ID: Kirsten Gibson   DOB: [birth year]  MR#: [id number]  WUJ#:[id number]  Patient Care Team: Karie Georges, MD as PCP - General (Family Medicine) Lurline Hare, MD (Radiation Oncology) Tracie Harrier, MD as Surgeon (Surgical Oncology) Ria Bush, MD as Referring Physician (Dermatology) Pershing Proud, RN as Oncology Nurse Navigator Rachel Moulds, MD as Consulting Physician (Hematology and Oncology) Fleeta Emmer, RN as [facility] Care Management   CHIEF COMPLAINT: estrogen receptor positive breast cancer (s/p bilateral mastectomies)  CURRENT TREATMENT: Observation   INTERVAL HISTORY: Kirsten Gibson returns today for follow-up of her estrogen receptor positive breast cancer.  She went off tamoxifen July 2021.  In hindsight, she feels like she should have continue tamoxifen for longer time but she says it did not agree with her, she had an upset stomach while she was on the medication.  She denies any new health changes.  She has had bilateral mastectomy and no reconstruction.  She denies any changes in breathing or bowel habits or urinary habits.  No new headaches or falls or any other symptoms that are concerning for recurrence. Basically she did not complain of any symptoms today for [place] to address.   COVID 19 VACCINATION STATUS: Status post Moderna x2+ booster x2   HISTORY OF PRESENT ILLNESS: From the original intake note:  She palpated a right breast mass March 2013 and presented for a mammogram. This confirmed the presence of a 1.9 cm mass in the upper outer quadrants. And adjacent mass was noted which was possibly thought to represent a intramammary lymph node. A right axillary lymph node was also noted to be enlarged. Biopsy of the upper outer quadrant mass showed a grade 2 invasive ductal carcinoma which was ER/PR positive HER-2 positive. Ki 67 was 12%. An MRI of the breast was performed 05/10/2011 which showed an overall area of the right breast measuring 5.0 x  2.6 x 2.2 cm of clumped linear enhancement. Majority of this was felt to represent DCIS. A small area of clumped enhancement was noted in the left breast which was not seen on the mammogram. A biopsy of this area was recommended. A biopsy of the right axillary lymph node was also positive for invasive ductal carcinoma.   The patient's subsequent history is as detailed below   PAST MEDICAL HISTORY: Past Medical History:  Diagnosis Date   Anemia due to chemotherapy 08/23/2011   Anxiety    Breast cancer (HCC)    bilateral infiltrating ductal carcinoma   Hx of radiation therapy 01/11/12- 02/28/12   right chest wall/supraclav fossa/drain site/scar boost   Mycosis fungoides    follows up with [facility] once to twice per year    Rosacea    S/P chemotherapy, time since 4-12 weeks    TMJ click    improved after changing the way dental exams were done   Urinary frequency     PAST SURGICAL HISTORY: Past Surgical History:  Procedure Laterality Date   ABDOMINAL HYSTERECTOMY  1988   partial with on ovary remaining.   benign tumor removed from neck  1974   BREAST BIOPSY  05/19/11   left  and right breast   BREAST EXCISIONAL BIOPSY  1991   benign   CHOLECYSTECTOMY     MASTECTOMY  11/2011   bilat, [facility]   PORT-A-CATH REMOVAL Left 04/06/2012   Procedure: MINOR REMOVAL PORT-A-CATH;  Surgeon: Currie Paris, MD;  Location: [facility];  Service: General;  Laterality: Left;  PORTACATH PLACEMENT  05/21/2011   Procedure: INSERTION PORT-A-CATH;  Surgeon: Currie Paris, MD;  Location: [facility];  Service: General;  Laterality: Left;    FAMILY HISTORY Family History  Problem Relation Age of Onset   Cancer Maternal Aunt        breast to brain   Cancer Paternal Grandmother        breast and/or ovarian///unknown   Stomach cancer Paternal Grandfather        stomach   Colon cancer Neg Hx    the patient's father died at age 29 in 2018/02/08from complications of  diabetes. The patient had no brothers, 3 sisters. One of the patient's mother's 2 sisters was diagnosed with breast cancer in her late 63s. There is no other history of breast or ovarian cancer in the family. The patient tells me she was evaluated for genetic testing at [facility], but that the insurance would not approve of the test.   GYNECOLOGIC HISTORY: Menarche age 37, she is GX P0. She underwent hysterectomy and unilateral salpingo-oophorectomy in 1988. She took hormone replacement for approximately 3 years.   SOCIAL HISTORY: She used to work at [employer], but is now retired. Her husband, Leonette Most, used to work for the [employer] He has a son from a prior marriage, and 2 grandchildren. The patient attends [address]    ADVANCED DIRECTIVES:  patient's husband is her healthcare power of attorney   HEALTH MAINTENANCE: Social History   Tobacco Use   Smoking status: Former    Packs/day: 1.00    Years: 20.00    Additional pack years: 0.00    Total pack years: 20.00    Types: Cigarettes    Quit date: 02/08/1986    Years since quitting: 36.3   Smokeless tobacco: Never  Substance Use Topics   Alcohol use: No   Drug use: No     Colonoscopy:  April 2014, [facility]  PAP:  Bone density: 05/20/2017 T-score of -2.2 osteopenia  Lipid panel:  Allergies  Allergen Reactions   Latex Dermatitis and Rash   Adhesive [Tape]     ALLERGIC TO OP-SITE......USE OP-SITE FLEXIGRID INSTEAD !!!   Trazodone And Nefazodone Nausea Only   Zofran [Ondansetron Hcl] Swelling    Swelling&redness to face!!   Diclofenac Other (See Comments)    Fever, chills   Naproxen Other (See Comments)    Fever, chills    Current Outpatient Medications  Medication Sig Dispense Refill   calcium carbonate (OS-CAL) 600 MG TABS Take 750 mg by mouth daily.      cholecalciferol (VITAMIN D3) 25 MCG (1000 UNIT) tablet Take 1,000 Units by mouth daily.      Glucose Blood (BLOOD GLUCOSE TEST STRIPS 333) STRP 1 strip by In Vitro route daily. 100 strip 3   Multiple Vitamins-Minerals (MULTIVITAMIN PO) Take 1 capsule by mouth daily.     olmesartan (BENICAR) 5 MG tablet Take by mouth daily.     No current facility-administered medications for this visit.    OBJECTIVE: white woman who appears stated age There were no vitals filed for this visit.    There is no height or weight on file to calculate BMI.    ECOG FS: 0 There were no vitals filed for this visit.  Physical Exam Constitutional:      Appearance: Normal appearance.  Cardiovascular:     Rate and Rhythm: Regular rhythm. Tachycardia present.  Chest:     Comments: Status post bilateral  mastectomies.  No chest wall recurrence or palpable regional adenopathy Musculoskeletal:        General: No swelling or tenderness.     Cervical back: Normal range of motion and neck supple. No rigidity.  Lymphadenopathy:     Cervical: No cervical adenopathy.  Skin:    General: Skin is warm and dry.  Neurological:     General: No focal deficit present.     Mental Status: She is alert.  Psychiatric:        Mood and Affect: Mood normal.      LAB RESULTS: Lab Results  Component Value Date   WBC 6.3 06/11/2021   NEUTROABS 3.6 06/11/2021   HGB 12.3 06/11/2021   HCT 36.9 06/11/2021   MCV 91.6 06/11/2021   PLT 246 06/11/2021      Chemistry      Component Value Date/Time   NA 136 06/11/2021 0836   NA 140 11/23/2016 0858   K 3.9 06/11/2021 0836   K 4.2 11/23/2016 0858   CL 101 06/11/2021 0836   CL 104 06/28/2012 0950   CO2 28 06/11/2021 0836   CO2 27 11/23/2016 0858   BUN 20 06/11/2021 0836   BUN 24.4 11/23/2016 0858   CREATININE 0.72 06/11/2021 0836   CREATININE 0.76 05/21/2019 1030   CREATININE 0.9 11/23/2016 0858      Component Value Date/Time   CALCIUM 9.5 06/11/2021 0836   CALCIUM 9.0 11/23/2016 0858   ALKPHOS 71 06/11/2021 0836   ALKPHOS 62 11/23/2016 0858   AST 26 06/11/2021  0836   AST 29 05/21/2019 1030   AST 25 11/23/2016 0858   ALT 22 06/11/2021 0836   ALT 28 05/21/2019 1030   ALT 22 11/23/2016 0858   BILITOT 0.4 06/11/2021 0836   BILITOT 0.4 05/21/2019 1030   BILITOT 0.23 11/23/2016 0858      Lab Results  Component Value Date   LABCA2 21 02/18/2012    STUDIES: No results found.   ASSESSMENT: 77 y.o. [place] woman  (1) status post right breast and axillary lymph node biopsy 05/04/2011, both positive for a grade 2 invasive ductal carcinoma, cT2 pN1 or stage IIB,estrogen receptor 82% and progesterone receptor 92% positive, with an MIB-1 of 12%, and HER-2 amplification by CISH with a ratio of 2.86  (2) biopsy 05/19/2011 of a second right breast area showed low-grade invasive ductal carcinoma, estrogen and progesterone receptor both 100% positive, with an MIB-1 of 13%, and no HER-2 amplification  (3) left breast biopsy 05/19/2011 showed a low-grade invasive ductal carcinoma, e-cadherin positive, HER-2 negative  (4) neoadjuvant treatment consisted of carboplatin/ docetaxel/ trastuzumab x6, started 06/02/2011 and completed 09/29/2011  (5) status post bilateral mastectomies at [facility] October 2013 showing  (a) on the right, a residual 1.2 cm invasive ductal carcinoma in the breast, with 3 of 19 lymph nodes involved; an additional lymph node showed only isolated tumor cells, so this is a ypT1c ypN1 result  (b) on the left, there was a 1 mm area of residual invasive ductal carcinoma, with all 5 sentinel lymph nodes negative  (6) the patient completed postmastectomy radiation to the right chest wall and right supraclavicular fossa 02/28/2012  (7) status post-op trastuzumab/ pertuzumab x3, discontinued 01/28/2012  (8) trastuzumab continued 02/18/2012, completed April 2014. Final echocardiogram 06/26/2012 showed a well preserved ejection fraction.  (9) tamoxifen started 03/25/2012, discontinued July 2021  (10) osteopenia, with bone density  05/23/2014 showing a T score of -1.9  (a) bone density scan on 05/20/2017  with a T-score of -2.2 osteopenia.   (11) history of greater than 20 pack year smoking, low-dose screening chest CT SEPT 2015 negative  (a) CT chest 06/04/2016 shows no evidence of malignancy.  (12) blood pressure and blood draws preferentially left arm  (13) history of mycosis fungoides initially diagnosed October 2006 with a Sezary cell prep of 0; seen by Sharman Crate at [facility].  (14) discharged to survivorship after April 2022 visit   PLAN:   She has been diagnosed 10 yrs ago.  At this time there is no concern for recurrence.  She has no concerning review of systems or physical examination findings to warrant additional investigation.  We have discussed that more than likely she is cured of this cancer but it makes sense to follow-up annually or as needed based on her concerns.  She was instructed to call us with any new questions or concerns and she expressed understanding.  CBC and CMP from today completely unremarkable. She will return to clinic with [place] in 1 year.  Total encounter time 30 minutes.* She is a new patient to me, transitioning from Dr. Darnelle Catalan.  We have reviewed her history today  *Total Encounter Time as defined by the Centers for Medicare and Medicaid Services includes, in addition to the face-to-face time of a patient visit (documented in the note above) non-face-to-face time: obtaining and reviewing outside history, ordering and reviewing medications, tests or procedures, care coordination (communications with other health care professionals or caregivers) and documentation in the medical record.

## 2022-06-16 ENCOUNTER — Ambulatory Visit (INDEPENDENT_AMBULATORY_CARE_PROVIDER_SITE_OTHER): Payer: Medicare Other

## 2022-06-16 ENCOUNTER — Other Ambulatory Visit (INDEPENDENT_AMBULATORY_CARE_PROVIDER_SITE_OTHER): Payer: Medicare Other

## 2022-06-16 VITALS — BP 122/62 | HR 62 | Temp 97.1°F | Ht 60.0 in | Wt 97.1 lb

## 2022-06-16 DIAGNOSIS — Z1211 Encounter for screening for malignant neoplasm of colon: Secondary | ICD-10-CM | POA: Diagnosis not present

## 2022-06-16 DIAGNOSIS — I10 Essential (primary) hypertension: Secondary | ICD-10-CM | POA: Diagnosis not present

## 2022-06-16 DIAGNOSIS — Z13220 Encounter for screening for lipoid disorders: Secondary | ICD-10-CM

## 2022-06-16 DIAGNOSIS — Z0000 Encounter for general adult medical examination without abnormal findings: Secondary | ICD-10-CM | POA: Diagnosis not present

## 2022-06-16 DIAGNOSIS — R7303 Prediabetes: Secondary | ICD-10-CM | POA: Diagnosis not present

## 2022-06-16 LAB — LIPID PANEL
Cholesterol: 195 mg/dL (ref 0–200)
HDL: 80.4 mg/dL (ref >39.00)
LDL Cholesterol: 99 mg/dL (ref 0–99)
NonHDL: 114.49
Total CHOL/HDL Ratio: 2
Triglycerides: 75 mg/dL (ref 0.0–149.0)
VLDL: 15 mg/dL (ref 0.0–40.0)

## 2022-06-16 LAB — HEMOGLOBIN A1C: Hgb A1c MFr Bld: 6 % (ref 4.6–6.5)

## 2022-06-16 LAB — COMPREHENSIVE METABOLIC PANEL
ALT: 24 U/L (ref 0–35)
AST: 27 U/L (ref 0–37)
Albumin: 4 g/dL (ref 3.5–5.2)
Alkaline Phosphatase: 64 U/L (ref 39–117)
BUN: 17 mg/dL (ref 6–23)
CO2: 23 mEq/L (ref 19–32)
Calcium: 9.5 mg/dL (ref 8.4–10.5)
Chloride: 94 mEq/L — ABNORMAL LOW (ref 96–112)
Creatinine, Ser: 0.64 mg/dL (ref 0.40–1.20)
GFR: 86.53 mL/min (ref >60.00)
Glucose, Bld: 78 mg/dL (ref 70–99)
Potassium: 3.9 mEq/L (ref 3.5–5.1)
Sodium: 128 mEq/L — ABNORMAL LOW (ref 135–145)
Total Bilirubin: 0.5 mg/dL (ref 0.2–1.2)
Total Protein: 6.7 g/dL (ref 6.0–8.3)

## 2022-06-16 NOTE — Progress Notes (Signed)
Subjective:   Kirsten Gibson is a 77 y.o. female who presents for Medicare Annual (Subsequent) preventive examination.  Review of Systems      Cardiac Risk Factors include: advanced age (>61men, >17 women);hypertension     Objective:    Today's Vitals   06/16/22 1353  BP: 122/62  Pulse: 62  Temp: (!) 97.1 F (36.2 C)  TempSrc: Oral  SpO2: 96%  Weight: 97 lb 1.6 oz (44 kg)  Height: 5' (1.524 m)   Body mass index is 18.96 kg/m.     06/16/2022    3:19 PM 07/01/2021    8:06 AM 06/24/2015   11:21 AM 06/03/2015    6:40 PM 05/21/2014   11:40 AM 11/12/2013    9:19 AM 11/12/2013    8:58 AM  Advanced Directives  Does Patient Have a Medical Advance Directive? Yes Yes Yes Yes No;Yes Yes Yes  Type of Estate agent of [place];Living will Healthcare Power of [place];Living will Healthcare Power of [place];Living will Healthcare Power of [employer] Power of [employer] Power of [employer] Power of Attorney  Does patient want to make changes to medical advance directive?  No - Patient declined No - Patient declined      Copy of Healthcare Power of Attorney in Chart? No - copy requested No - copy requested         Current Medications (verified) Outpatient Encounter Medications as of 06/16/2022  Medication Sig   calcium carbonate (OS-CAL) 600 MG TABS Take 750 mg by mouth daily.    cholecalciferol (VITAMIN D3) 25 MCG (1000 UNIT) tablet Take 1,000 Units by mouth daily.   Glucose Blood (BLOOD GLUCOSE TEST STRIPS 333) STRP 1 strip by In Vitro route daily.   Multiple Vitamins-Minerals (MULTIVITAMIN PO) Take 1 capsule by mouth daily.   olmesartan (BENICAR) 5 MG tablet Take by mouth daily.   No facility-administered encounter medications on file as of 06/16/2022.    Allergies (verified) Latex, Adhesive [tape], Trazodone and nefazodone, Zofran [ondansetron hcl], Diclofenac, and Naproxen   History: Past Medical History:  Diagnosis Date   Anemia  due to chemotherapy 08/23/2011   Anxiety    Breast cancer (HCC)    bilateral infiltrating ductal carcinoma   Hx of radiation therapy 01/11/12- 02/28/12   right chest wall/supraclav fossa/drain site/scar boost   Mycosis fungoides    follows up with [facility] once to twice per year    Rosacea    S/P chemotherapy, time since 4-12 weeks    TMJ click    improved after changing the way dental exams were done   Urinary frequency    Past Surgical History:  Procedure Laterality Date   ABDOMINAL HYSTERECTOMY  1988   partial with on ovary remaining.   benign tumor removed from neck  1974   BREAST BIOPSY  05/19/11   left  and right breast   BREAST EXCISIONAL BIOPSY  1991   benign   CHOLECYSTECTOMY     MASTECTOMY  11/2011   bilat, [facility]   PORT-A-CATH REMOVAL Left 04/06/2012   Procedure: MINOR REMOVAL PORT-A-CATH;  Surgeon: Currie Paris, MD;  Location: [facility];  Service: General;  Laterality: Left;   PORTACATH PLACEMENT  05/21/2011   Procedure: INSERTION PORT-A-CATH;  Surgeon: Currie Paris, MD;  Location: [facility];  Service: General;  Laterality: Left;   Family History  Problem Relation Age of Onset   Cancer Maternal Aunt        breast  to brain   Cancer Paternal Grandmother        breast and/or ovarian///unknown   Stomach cancer Paternal Grandfather        stomach   Colon cancer Neg Hx    Social History   Socioeconomic History   Marital status: Married    Spouse name: Not on file   Number of children: Not on file   Years of education: Not on file   Highest education level: Not on file  Occupational History   Not on file  Tobacco Use   Smoking status: Former    Packs/day: 1.00    Years: 20.00    Additional pack years: 0.00    Total pack years: 20.00    Types: Cigarettes    Quit date: 02/08/1986    Years since quitting: 36.3   Smokeless tobacco: Never  Substance and Sexual Activity   Alcohol use: No   Drug use: No   Sexual activity: Yes     Birth control/protection: Post-menopausal  Other Topics Concern   Not on file  Social History Narrative   Not on file   Social Determinants of Health   Financial Resource Strain: Low Risk  (06/16/2022)   Overall Financial Resource Strain (CARDIA)    Difficulty of Paying Living Expenses: Not hard at all  Food Insecurity: No Food Insecurity (06/16/2022)   Hunger Vital Sign    Worried About Running Out of Food in the Last Year: Never true    Ran Out of Food in the Last Year: Never true  Transportation Needs: No Transportation Needs (06/16/2022)   PRAPARE - Administrator, Civil Service (Medical): No    Lack of Transportation (Non-Medical): No  Physical Activity: Inactive (06/16/2022)   Exercise Vital Sign    Days of Exercise per Week: 0 days    Minutes of Exercise per Session: 0 min  Stress: No Stress Concern Present (06/16/2022)   [employer] of Occupational Health - Occupational Stress Questionnaire    Feeling of Stress : Not at all  Social Connections: Moderately Isolated (06/16/2022)   Social Connection and Isolation Panel [NHANES]    Frequency of Communication with Friends and Family: More than three times a week    Frequency of Social Gatherings with Friends and Family: More than three times a week    Attends Religious Services: Never    Database administrator or Organizations: No    Attends Engineer, structural: Never    Marital Status: Married    Tobacco Counseling Counseling given: No   Clinical Intake:  Pre-visit preparation completed: No  Pain : No/denies pain     BMI - recorded: 18.76 Nutritional Status: BMI <19  Underweight Nutritional Risks: None Diabetes: No  How often do you need to have someone help you when you read instructions, pamphlets, or other written materials from your doctor or pharmacy?: 1 - Never  Diabetic? No   Interpreter Needed?: No  Information entered by :: Theresa Mulligan LPN   Activities of Daily Living     06/16/2022    3:17 PM  In your present state of health, do you have any difficulty performing the following activities:  Hearing? 0  Vision? 0  Difficulty concentrating or making decisions? 0  Walking or climbing stairs? 0  Dressing or bathing? 0  Doing errands, shopping? 0  Preparing Food and eating ? N  Using the Toilet? N  In the past six months, have you accidently leaked urine? N  Do you have problems with loss of bowel control? N  Managing your Medications? N  Managing your Finances? N  Housekeeping or managing your Housekeeping? N    Patient Care Team: Karie Georges, MD as PCP - General (Family Medicine) Lurline Hare, MD (Radiation Oncology) Tracie Harrier, MD as Surgeon (Surgical Oncology) Ria Bush, MD as Referring Physician (Dermatology) Pershing Proud, RN as Oncology Nurse Navigator Rachel Moulds, MD as Consulting Physician (Hematology and Oncology) Fleeta Emmer, RN as [facility] Care Management  Indicate any recent Medical Services you may have received from other than [facility] providers in the past year (date may be approximate).     Assessment:   This is a routine wellness examination for Nyya.  Hearing/Vision screen Hearing Screening - Comments:: Denies hearing difficulties   Vision Screening - Comments:: Wears reading glasses - up to date with routine eye exams with  Deferred   Dietary issues and exercise activities discussed: Exercise limited by: None identified   Goals Addressed               This Visit's Progress     Stay healthy (pt-stated)        I do not want to be pre-diabetic!       Depression Screen    06/16/2022    3:16 PM 04/01/2022    9:53 AM  PHQ 2/9 Scores  PHQ - 2 Score 0 0    Fall Risk    06/16/2022    3:18 PM  Fall Risk   Falls in the past year? 0  Number falls in past yr: 0  Injury with Fall? 0  Risk for fall due to : No Fall Risks  Follow up Falls prevention discussed    FALL RISK  PREVENTION PERTAINING TO THE HOME:  Any stairs in or around the home? Yes  If so, are there any without handrails? No  Home free of loose throw rugs in walkways, pet beds, electrical cords, etc? Yes  Adequate lighting in your home to reduce risk of falls? Yes   ASSISTIVE DEVICES UTILIZED TO PREVENT FALLS:  Life alert? No  Use of a cane, walker or w/c? No  Grab bars in the bathroom? No  Shower chair or bench in shower? No  Elevated toilet seat or a handicapped toilet? No   TIMED UP AND GO:  Was the test performed? Yes .  Length of time to ambulate 10 feet: 10 sec.   Gait steady and fast without use of assistive device  Cognitive Function:        06/16/2022    3:19 PM  6CIT Screen  What Year? 0 points  What month? 0 points  What time? 0 points  Count back from 20 0 points  Months in reverse 0 points  Repeat phrase 0 points  Total Score 0 points    Immunizations Immunization History  Administered Date(s) Administered   Fluad Quad(high Dose 65+) 11/12/2021   Influenza-Unspecified 11/04/2015   MODERNA COVID-19 SARS-COV-2 PEDS BIVALENT BOOSTER 6Y-11Y 12/07/2019   Pneumococcal Polysaccharide-23 04/11/2012   Pneumococcal-Unspecified 03/06/2015   Tetanus 07/31/2013    TDAP status: Up to date  Flu Vaccine status: Up to date  Pneumococcal vaccine status: Due, Education has been provided regarding the importance of this vaccine. Advised may receive this vaccine at local pharmacy or Health Dept. Aware to provide a copy of the vaccination record if obtained from local pharmacy or Health Dept. Verbalized acceptance and understanding.  Covid-19  vaccine status: Completed vaccines  Qualifies for Shingles Vaccine? Yes   Zostavax completed No   Shingrix Completed?: No.    Education has been provided regarding the importance of this vaccine. Patient has been advised to call insurance company to determine out of pocket expense if they have not yet received this vaccine. Advised may  also receive vaccine at local pharmacy or Health Dept. Verbalized acceptance and understanding.  Screening Tests Health Maintenance  Topic Date Due   DTaP/Tdap/Td (1 - Tdap) 08/01/2013   COVID-19 Vaccine (2 - Moderna risk series) 07/02/2022 (Originally 01/04/2020)   Hepatitis C Screening  09/02/2022 (Originally 03/05/1963)   Zoster Vaccines- Shingrix (1 of 2) 09/16/2022 (Originally 03/05/1964)   Pneumonia Vaccine 31+ Years old (2 of 2 - PCV) 06/16/2023 (Originally 03/05/2016)   COLONOSCOPY (Pts 45-64yrs Insurance coverage will need to be confirmed)  06/16/2023 (Originally 05/25/2022)   INFLUENZA VACCINE  09/09/2022   Medicare Annual Wellness (AWV)  06/16/2023   DEXA SCAN  Completed   HPV VACCINES  Aged Out    Health Maintenance  Health Maintenance Due  Topic Date Due   DTaP/Tdap/Td (1 - Tdap) 08/01/2013    Colorectal cancer screening: Referral to GI placed 06/16/22. Pt aware the office will call re: appt.  Mammogram status: No longer required due to Age.  Bone Density status: Completed 03/18/22. Results reflect: Bone density results: OSTEOPOROSIS. Repeat every   years.  Lung Cancer Screening: (Low Dose CT Chest recommended if Age 23-80 years, 30 pack-year currently smoking OR have quit w/in 15years.) does not qualify.     Additional Screening:  Hepatitis C Screening: does qualify; Completed Deferred  Vision Screening: Recommended annual ophthalmology exams for early detection of glaucoma and other disorders of the eye. Is the patient up to date with their annual eye exam?  Yes  Who is the provider or what is the name of the office in which the patient attends annual eye exams? Deferred If pt is not established with a provider, would they like to be referred to a provider to establish care? No .   Dental Screening: Recommended annual dental exams for proper oral hygiene  Community Resource Referral / Chronic Care Management:  CRR required this visit?  No   CCM required this  visit?  No      Plan:     I have personally reviewed and noted the following in the patient's chart:   Medical and social history Use of alcohol, tobacco or illicit drugs  Current medications and supplements including opioid prescriptions. Patient is not currently taking opioid prescriptions. Functional ability and status Nutritional status Physical activity Advanced directives List of other physicians Hospitalizations, surgeries, and ER visits in previous 12 months Vitals Screenings to include cognitive, depression, and falls Referrals and appointments  In addition, I have reviewed and discussed with patient certain preventive protocols, quality metrics, and best practice recommendations. A written personalized care plan for preventive services as well as general preventive health recommendations were provided to patient.     Tillie Rung, LPN   10/09/4780   Nurse Notes: Patient due Hep-C Screening

## 2022-06-16 NOTE — Patient Instructions (Addendum)
Kirsten Gibson , Thank you for taking time to come for your Medicare Wellness Visit. I appreciate your ongoing commitment to your health goals. Please review the following plan we discussed and let me know if I can assist you in the future.   These are the goals we discussed:  Goals       Pre-diabetes Management      Patient Goals/Self Care Activities: -Patient/Caregiver will self-administer medications as prescribed as evidenced by self-report/primary caregiver report  -Patient/Caregiver will attend all scheduled provider appointments as evidenced by clinician review of documented attendance to scheduled appointments and patient/caregiver report  Patient Goals/Self Care Activities: -record values and write them down take them to all doctor visits  Limit carbohydrates and sweets  Interventions Today    Flowsheet Row Most Recent Value  Chronic Disease   Chronic disease during today's visit Diabetes  General Interventions   General Interventions Discussed/Reviewed General Interventions Reviewed  Education Interventions   Education Provided Provided Education           Stay healthy (pt-stated)      I do not want to be pre-diabetic!        This is a list of the screening recommended for you and due dates:  Health Maintenance  Topic Date Due   DTaP/Tdap/Td vaccine (1 - Tdap) 08/01/2013   COVID-19 Vaccine (2 - Moderna risk series) 07/02/2022*   Hepatitis C Screening: USPSTF Recommendation to screen - Ages 18-79 yo.  09/02/2022*   Zoster (Shingles) Vaccine (1 of 2) 09/16/2022*   Pneumonia Vaccine (2 of 2 - PCV) 06/16/2023*   Colon Cancer Screening  06/16/2023*   Flu Shot  09/09/2022   Medicare Annual Wellness Visit  06/16/2023   DEXA scan (bone density measurement)  Completed   HPV Vaccine  Aged Out  *Topic was postponed. The date shown is not the original due date.    Advanced directives: Please bring a copy of your health care power of attorney and living will to the office to  be added to your chart at your convenience.   Conditions/risks identified: None  Next appointment: Follow up in one year for your annual wellness visit    Preventive Care 65 Years and Older, Female Preventive care refers to lifestyle choices and visits with your health care provider that can promote health and wellness. What does preventive care include? A yearly physical exam. This is also called an annual well check. Dental exams once or twice a year. Routine eye exams. Ask your health care provider how often you should have your eyes checked. Personal lifestyle choices, including: Daily care of your teeth and gums. Regular physical activity. Eating a healthy diet. Avoiding tobacco and drug use. Limiting alcohol use. Practicing safe sex. Taking low-dose aspirin every day. Taking vitamin and mineral supplements as recommended by your health care provider. What happens during an annual well check? The services and screenings done by your health care provider during your annual well check will depend on your age, overall health, lifestyle risk factors, and family history of disease. Counseling  Your health care provider may ask you questions about your: Alcohol use. Tobacco use. Drug use. Emotional well-being. Home and relationship well-being. Sexual activity. Eating habits. History of falls. Memory and ability to understand (cognition). Work and work Astronomer. Reproductive health. Screening  You may have the following tests or measurements: Height, weight, and BMI. Blood pressure. Lipid and cholesterol levels. These may be checked every 5 years, or more frequently if you are  over 73 years old. Skin check. Lung cancer screening. You may have this screening every year starting at age 37 if you have a 30-pack-year history of smoking and currently smoke or have quit within the past 15 years. Fecal occult blood test (FOBT) of the stool. You may have this test every year  starting at age 67. Flexible sigmoidoscopy or colonoscopy. You may have a sigmoidoscopy every 5 years or a colonoscopy every 10 years starting at age 60. Hepatitis C blood test. Hepatitis B blood test. Sexually transmitted disease (STD) testing. Diabetes screening. This is done by checking your blood sugar (glucose) after you have not eaten for a while (fasting). You may have this done every 1-3 years. Bone density scan. This is done to screen for osteoporosis. You may have this done starting at age 67. Mammogram. This may be done every 1-2 years. Talk to your health care provider about how often you should have regular mammograms. Talk with your health care provider about your test results, treatment options, and if necessary, the need for more tests. Vaccines  Your health care provider may recommend certain vaccines, such as: Influenza vaccine. This is recommended every year. Tetanus, diphtheria, and acellular pertussis (Tdap, Td) vaccine. You may need a Td booster every 10 years. Zoster vaccine. You may need this after age 62. Pneumococcal 13-valent conjugate (PCV13) vaccine. One dose is recommended after age 39. Pneumococcal polysaccharide (PPSV23) vaccine. One dose is recommended after age 94. Talk to your health care provider about which screenings and vaccines you need and how often you need them. This information is not intended to replace advice given to you by your health care provider. Make sure you discuss any questions you have with your health care provider. Document Released: 02/21/2015 Document Revised: 10/15/2015 Document Reviewed: 11/26/2014 Elsevier Interactive Patient Education  2017 [employer].  Fall Prevention in the Home Falls can cause injuries. They can happen to people of all ages. There are many things you can do to make your home safe and to help prevent falls. What can I do on the outside of my home? Regularly fix the edges of walkways and driveways and fix any  cracks. Remove anything that might make you trip as you walk through a door, such as a raised step or threshold. Trim any bushes or trees on the path to your home. Use bright outdoor lighting. Clear any walking paths of anything that might make someone trip, such as rocks or tools. Regularly check to see if handrails are loose or broken. Make sure that both sides of any steps have handrails. Any raised decks and porches should have guardrails on the edges. Have any leaves, snow, or ice cleared regularly. Use sand or salt on walking paths during winter. Clean up any spills in your garage right away. This includes oil or grease spills. What can I do in the bathroom? Use night lights. Install grab bars by the toilet and in the tub and shower. Do not use towel bars as grab bars. Use non-skid mats or decals in the tub or shower. If you need to sit down in the shower, use a plastic, non-slip stool. Keep the floor dry. Clean up any water that spills on the floor as soon as it happens. Remove soap buildup in the tub or shower regularly. Attach bath mats securely with double-sided non-slip rug tape. Do not have throw rugs and other things on the floor that can make you trip. What can I do in the  bedroom? Use night lights. Make sure that you have a light by your bed that is easy to reach. Do not use any sheets or blankets that are too big for your bed. They should not hang down onto the floor. Have a firm chair that has side arms. You can use this for support while you get dressed. Do not have throw rugs and other things on the floor that can make you trip. What can I do in the kitchen? Clean up any spills right away. Avoid walking on wet floors. Keep items that you use a lot in easy-to-reach places. If you need to reach something above you, use a strong step stool that has a grab bar. Keep electrical cords out of the way. Do not use floor polish or wax that makes floors slippery. If you must  use wax, use non-skid floor wax. Do not have throw rugs and other things on the floor that can make you trip. What can I do with my stairs? Do not leave any items on the stairs. Make sure that there are handrails on both sides of the stairs and use them. Fix handrails that are broken or loose. Make sure that handrails are as long as the stairways. Check any carpeting to make sure that it is firmly attached to the stairs. Fix any carpet that is loose or worn. Avoid having throw rugs at the top or bottom of the stairs. If you do have throw rugs, attach them to the floor with carpet tape. Make sure that you have a light switch at the top of the stairs and the bottom of the stairs. If you do not have them, ask someone to add them for you. What else can I do to help prevent falls? Wear shoes that: Do not have high heels. Have rubber bottoms. Are comfortable and fit you well. Are closed at the toe. Do not wear sandals. If you use a stepladder: Make sure that it is fully opened. Do not climb a closed stepladder. Make sure that both sides of the stepladder are locked into place. Ask someone to hold it for you, if possible. Clearly mark and make sure that you can see: Any grab bars or handrails. First and last steps. Where the edge of each step is. Use tools that help you move around (mobility aids) if they are needed. These include: Canes. Walkers. Scooters. Crutches. Turn on the lights when you go into a dark area. Replace any light bulbs as soon as they burn out. Set up your furniture so you have a clear path. Avoid moving your furniture around. If any of your floors are uneven, fix them. If there are any pets around you, be aware of where they are. Review your medicines with your doctor. Some medicines can make you feel dizzy. This can increase your chance of falling. Ask your doctor what other things that you can do to help prevent falls. This information is not intended to replace  advice given to you by your health care provider. Make sure you discuss any questions you have with your health care provider. Document Released: 11/21/2008 Document Revised: 07/03/2015 Document Reviewed: 03/01/2014 Elsevier Interactive Patient Education  2017 [employer].

## 2022-06-29 ENCOUNTER — Encounter: Payer: Medicare Other | Admitting: Family Medicine

## 2022-07-06 ENCOUNTER — Telehealth: Payer: Self-pay

## 2022-07-06 ENCOUNTER — Ambulatory Visit: Payer: Self-pay

## 2022-07-06 NOTE — Patient Instructions (Signed)
Visit Information  Thank you for taking time to visit with me today. Please don't hesitate to contact me if I can be of assistance to you.   Following are the goals we discussed today:   Goals Addressed             This Visit's Progress    Pre-diabetes Management       Patient Goals/Self Care Activities: -Patient/Caregiver will self-administer medications as prescribed as evidenced by self-report/primary caregiver report  -Patient/Caregiver will attend all scheduled provider appointments as evidenced by clinician review of documented attendance to scheduled appointments and patient/caregiver report  Patient Goals/Self Care Activities: -record values and write them down take them to all doctor visits  Limit carbohydrates and sweets  Discussed diabetic diet.          Our next appointment is by telephone on 08/03/22 at 1200  Please call the care guide team at [phone] if you need to cancel or reschedule your appointment.   If you are experiencing a Mental Health or Behavioral Health Crisis or need someone to talk to, please call the Suicide and Crisis Lifeline: 988   Patient verbalizes understanding of instructions and care plan provided today and agrees to view in MyChart. Active MyChart status and patient understanding of how to access instructions and care plan via MyChart confirmed with patient.     The patient has been provided with contact information for the care management team and has been advised to call with any health related questions or concerns.   Bary Leriche, RN, MSN [facility] Care Management Care Management Coordinator Direct Line [phone]

## 2022-07-06 NOTE — Patient Outreach (Signed)
  Care Coordination   07/06/2022 Name: Kirsten Gibson MRN: [id number] DOB: 04/17/1945   Care Coordination Outreach Attempts:  An unsuccessful telephone outreach was attempted today to offer the patient information about available care coordination services.  Follow Up Plan:  Additional outreach attempts will be made to offer the patient care coordination information and services.   Encounter Outcome:  No Answer   Care Coordination Interventions:  No, not indicated    Bary Leriche, RN, MSN [facility] Care Management Care Management Coordinator Direct Line [phone]

## 2022-07-06 NOTE — Patient Outreach (Signed)
  Care Coordination   Follow Up Visit Note   07/06/2022 Name: Kirsten Gibson MRN: [id number] DOB: [birth year]  Kirsten Gibson is a 77 y.o. year old female who sees Kirsten Georges, MD for primary care. I spoke with  Kirsten Gibson by phone today.  What matters to the patients health and wellness today?  Diabetes management    Goals Addressed             This Visit's Progress    Pre-diabetes Management       Patient Goals/Self Care Activities: -Patient/Caregiver will self-administer medications as prescribed as evidenced by self-report/primary caregiver report  -Patient/Caregiver will attend all scheduled provider appointments as evidenced by clinician review of documented attendance to scheduled appointments and patient/caregiver report  Patient Goals/Self Care Activities: -record values and write them down take them to all doctor visits  Limit carbohydrates and sweets  Discussed diabetic diet.          SDOH assessments and interventions completed:  Yes     Care Coordination Interventions:  Yes, provided   Follow up plan: Follow up call scheduled for Kirsten    Encounter Outcome:  Pt. Visit Completed   Kirsten Leriche, RN, MSN [facility] Care Management Care Management Coordinator Direct Line [phone]

## 2022-07-12 ENCOUNTER — Telehealth: Payer: Self-pay | Admitting: Family Medicine

## 2022-07-12 NOTE — Telephone Encounter (Signed)
Prescription Request  07/12/2022  LOV: 03/04/2022  What is the name of the medication or equipment? olmesartan (BENICAR) 5 MG tablet  asking for 3 prescriptions, each one for 90 pills   Have you contacted your pharmacy to request a refill? No   Which pharmacy would you like this sent to?     [id number] - [place], [place] - [address] Phone: [phone]  Fax: [phone]      Patient notified that their request is being sent to the clinical staff for review and that they should receive a response within 2 business days.   Please advise at Mobile [phone]

## 2022-07-13 MED ORDER — OLMESARTAN MEDOXOMIL 5 MG PO TABS: 5.0000 mg | ORAL_TABLET | Freq: Every day | ORAL | 3 refills | Status: DC

## 2022-07-13 NOTE — Telephone Encounter (Signed)
Ok to refill 90 day supply with 3 refills

## 2022-07-13 NOTE — Addendum Note (Signed)
Addended by: Johnella Moloney on: 07/13/2022 01:44 PM   Modules accepted: Orders

## 2022-07-13 NOTE — Telephone Encounter (Signed)
Rx done. 

## 2022-07-27 ENCOUNTER — Ambulatory Visit (INDEPENDENT_AMBULATORY_CARE_PROVIDER_SITE_OTHER): Payer: Medicare Other | Admitting: Family Medicine

## 2022-07-27 ENCOUNTER — Encounter: Payer: Self-pay | Admitting: Family Medicine

## 2022-07-27 VITALS — BP 105/61 | HR 75 | Temp 98.7°F | Wt 92.9 lb

## 2022-07-27 DIAGNOSIS — R7303 Prediabetes: Secondary | ICD-10-CM | POA: Diagnosis not present

## 2022-07-27 DIAGNOSIS — I10 Essential (primary) hypertension: Secondary | ICD-10-CM | POA: Diagnosis not present

## 2022-07-27 MED ORDER — OLMESARTAN MEDOXOMIL 5 MG PO TABS: 5.0000 mg | ORAL_TABLET | Freq: Every day | ORAL | 3 refills | Status: DC

## 2022-07-27 NOTE — Assessment & Plan Note (Signed)
A1C is stable at 6.0, pt has lost a lot of weight recently due to her dietary changes, however it has not affected her A1C.  I reassured her that she is doing a good job with diet in order to help reduce her anxiety surrounding this diagnosis.

## 2022-07-27 NOTE — Progress Notes (Signed)
Established Patient Office Visit  Subjective   Patient ID: Kirsten Gibson, female    DOB: May 11, 1945  Age: 77 y.o. MRN: [id number]  Chief Complaint  Patient presents with   Follow-up   Medical Management of Chronic Issues    Pt is here for follow up today. She states that her blood pressure at home has been low. She denies any dizziness or headaches, no chest pain.   preDM-- pt reports that she has cut out all sugar and has been eating much less than before. Her weight has dropped significantly, she has been reducing carbohydrates in her diet. She is also reporting that her bowels are much looser than before, states that it has been going on for about 2 weeks, no abdominal pain, no nausea or vomiting at this time. No fever or chills.   I have reviewed her labs today, she had low sodium and chloride, states that she has tried to increase her salt intake as well. We discussed increasing fluids and water intake.   BP today was low on her home machine, I repeated it here and it was 105/61. We discussed the olmesartan and if she should continue the medication.     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) 09/01/2021   HTN (hypertension), benign 09/01/2021   Postmenopausal state 09/01/2021   Prediabetes 09/01/2021   Vascular insufficiency of extremity 05/21/2019   Lung nodule seen on imaging study 05/24/2016   OAB (overactive bladder) 01/21/2016   Vaginal atrophy 01/21/2016   White  coat syndrome without hypertension 06/26/2012   Pain of left calf 06/26/2012   TMJ click    S/P chemotherapy, time since 4-12 weeks    Rosacea    History of external beam radiation therapy    Red blood cell antibody positive, compatible PRBC difficult to obtain 12/29/2011   Neoplastic disease 09/28/2011   Malignant neoplasm of upper-outer quadrant of right breast in female, estrogen receptor positive (HCC) 05/19/2011   Primary cancer of upper outer quadrant of right female breast (HCC) 05/06/2011      Review of Systems  All other systems reviewed and are negative.     Objective:     BP 105/61   Pulse 75   Temp 98.7 F (37.1 C) (Oral)   Wt 92 lb 14.4 oz (42.1 kg)   LMP 02/09/1996   SpO2 97%   BMI 18.14 kg/m    Physical Exam Vitals reviewed.  Constitutional:      Appearance: Normal appearance. She is well-groomed and normal weight.  Eyes:     Conjunctiva/sclera: Conjunctivae normal.  Neck:     Thyroid: No thyromegaly.  Cardiovascular:     Rate and Rhythm: Normal rate and regular rhythm.     Pulses: Normal pulses.     Heart sounds: S1 normal and S2 normal.  Pulmonary:     Effort: Pulmonary effort is normal.     Breath sounds: Normal breath sounds and air entry.  Abdominal:  General: Bowel sounds are normal.  Musculoskeletal:     Right lower leg: No edema.     Left lower leg: No edema.  Neurological:     Mental Status: She is alert and oriented to person, place, and time. Mental status is at baseline.     Gait: Gait is intact.  Psychiatric:        Mood and Affect: Mood and affect normal.        Speech: Speech normal.        Behavior: Behavior normal.        Judgment: Judgment normal.      No results found for any visits on 07/27/22.  Last metabolic panel Lab Results  Component Value Date   GLUCOSE 78 06/16/2022   NA 128 (L) 06/16/2022   K 3.9 06/16/2022   CL 94 (L) 06/16/2022   CO2 23 06/16/2022   BUN 17 06/16/2022   CREATININE 0.64 06/16/2022    GFRNONAA >60 06/11/2021   CALCIUM 9.5 06/16/2022   PROT 6.7 06/16/2022   ALBUMIN 4.0 06/16/2022   BILITOT 0.5 06/16/2022   ALKPHOS 64 06/16/2022   AST 27 06/16/2022   ALT 24 06/16/2022   ANIONGAP 7 06/11/2021   Last lipids Lab Results  Component Value Date   CHOL 195 06/16/2022   HDL 80.40 06/16/2022   LDLCALC 99 06/16/2022   TRIG 75.0 06/16/2022   CHOLHDL 2 06/16/2022   Last hemoglobin A1c Lab Results  Component Value Date   HGBA1C 6.0 06/16/2022      The 10-year ASCVD risk score (Arnett DK, et al., 2019) is: 20.9%    Assessment & Plan:  Prediabetes Assessment & Plan: A1C is stable at 6.0, pt has lost a lot of weight recently due to her dietary changes, however it has not affected her A1C.  I reassured her that she is doing a good job with diet in order to help reduce her anxiety surrounding this diagnosis.    HTN (hypertension), benign Assessment & Plan: BP recheck is 105/61, well controlled, in fact we may consider stopping the olmesartan-- pt is not having any symptoms of low blood pressure at this time. She has lost a considerable amount of weight due to her dietary changes and I advised that she not lose any more, will recheck her weight at the next visit.   Orders: -     Olmesartan Medoxomil; Take 1 tablet (5 mg total) by mouth daily.  Dispense: 90 tablet; Refill: 3     Return in about 6 months (around 01/26/2023).    Karie Georges, MD

## 2022-07-27 NOTE — Assessment & Plan Note (Signed)
BP recheck is 105/61, well controlled, in fact we may consider stopping the olmesartan-- pt is not having any symptoms of low blood pressure at this time. She has lost a considerable amount of weight due to her dietary changes and I advised that she not lose any more, will recheck her weight at the next visit.

## 2022-07-28 ENCOUNTER — Telehealth: Payer: Self-pay | Admitting: *Deleted

## 2022-07-28 DIAGNOSIS — I10 Essential (primary) hypertension: Secondary | ICD-10-CM

## 2022-07-28 NOTE — Telephone Encounter (Signed)
[facility] - [address]   olmesartan (BENICAR) 5 MG tablet is on backorder  Please advise.

## 2022-07-29 MED ORDER — LISINOPRIL 5 MG PO TABS
5.0000 mg | ORAL_TABLET | Freq: Every day | ORAL | 1 refills | Status: DC
Start: 2022-07-29 — End: 2023-02-24

## 2022-07-29 NOTE — Telephone Encounter (Signed)
Left a message for the patient to return my call.  

## 2022-07-29 NOTE — Telephone Encounter (Signed)
Patient informed that rx was sent 

## 2022-08-03 ENCOUNTER — Ambulatory Visit: Payer: Self-pay

## 2022-08-03 NOTE — Patient Outreach (Signed)
  Care Coordination   08/03/2022 Name: Kirsten Gibson MRN: [id number] DOB: 06-06-1945   Care Coordination Outreach Attempts:  An unsuccessful telephone outreach was attempted today to offer the patient information about available care coordination services.  Follow Up Plan:  Additional outreach attempts will be made to offer the patient care coordination information and services.   Encounter Outcome:  No Answer   Care Coordination Interventions:  No, not indicated    Bary Leriche, RN, MSN [facility] Care Management Care Management Coordinator Direct Line [phone]

## 2022-08-09 ENCOUNTER — Telehealth: Payer: Self-pay

## 2022-08-09 NOTE — Patient Outreach (Signed)
  Care Coordination   08/09/2022 Name: Kirsten Gibson MRN: [id number] DOB: Jun 11, 1945   Care Coordination Outreach Attempts:  A second unsuccessful outreach was attempted today to offer the patient with information about available care coordination services.  Follow Up Plan:  Additional outreach attempts will be made to offer the patient care coordination information and services.   Encounter Outcome:  Pt. Request to Call Back   Care Coordination Interventions:  No, not indicated    Bary Leriche, RN, MSN [facility] Care Management Care Management Coordinator Direct Line [phone]

## 2022-08-16 ENCOUNTER — Telehealth: Payer: Self-pay | Admitting: *Deleted

## 2022-08-16 NOTE — Progress Notes (Signed)
  Care Coordination Note  08/16/2022 Name: Kirsten Gibson MRN: [id number] DOB: September 30, 1945  Kirsten Gibson is a 77 y.o. year old female who is a primary care patient of Karie Georges, MD and is actively engaged with the care management team. I reached out to Elberta Leatherwood by phone today to assist with re-scheduling a follow up visit with the RN Case Manager  Follow up plan: We have been unable to make contact with the patient for follow up.   Burman Nieves, CCMA Care Coordination Care Guide Direct Dial: [phone]

## 2022-08-18 ENCOUNTER — Telehealth: Payer: Self-pay

## 2022-08-18 NOTE — Patient Outreach (Signed)
  Care Coordination   08/18/2022 Name: Kirsten Gibson MRN: [id number] DOB: 11-26-1945   Care Coordination Outreach Attempts:  A second unsuccessful outreach was attempted today to offer the patient with information about available care coordination services.  Follow Up Plan:  Additional outreach attempts will be made to offer the patient care coordination information and services.   Encounter Outcome:  No Answer   Care Coordination Interventions:  No, not indicated    Bary Leriche, RN, MSN [facility] Care Management Care Management Coordinator Direct Line [phone]

## 2022-08-24 ENCOUNTER — Telehealth: Payer: Self-pay

## 2022-08-24 NOTE — Patient Outreach (Signed)
  Care Coordination   08/24/2022 Name: Kirsten Gibson MRN: [id number] DOB: 1945/04/03   Care Coordination Outreach Attempts:  A third unsuccessful outreach was attempted today to offer the patient with information about available care coordination services.  Follow Up Plan:  No further outreach attempts will be made at this time. We have been unable to contact the patient to offer or enroll patient in care coordination services  Encounter Outcome:  No Answer    Care Coordination Interventions:  No, not indicated    Bary Leriche, RN, MSN [facility] Care Management Care Management Coordinator Direct Line [phone]

## 2022-09-14 ENCOUNTER — Ambulatory Visit (INDEPENDENT_AMBULATORY_CARE_PROVIDER_SITE_OTHER): Payer: Medicare Other | Admitting: Family Medicine

## 2022-09-14 ENCOUNTER — Encounter: Payer: Self-pay | Admitting: Family Medicine

## 2022-09-14 VITALS — BP 138/80 | HR 80 | Temp 98.4°F | Ht 60.0 in | Wt 87.7 lb

## 2022-09-14 DIAGNOSIS — R634 Abnormal weight loss: Secondary | ICD-10-CM | POA: Diagnosis not present

## 2022-09-14 MED ORDER — MIRTAZAPINE 7.5 MG PO TABS
7.5000 mg | ORAL_TABLET | Freq: Every day | ORAL | 2 refills | Status: DC
Start: 2022-09-14 — End: 2022-11-29

## 2022-09-14 NOTE — Patient Instructions (Signed)
Take the 7.5 mg of mirtazapine daily at bedtime for at least 2 weeks. If you feel that the medication is nor working then it is ok to increase to 15 mg (2 tablets) daily at bedtime

## 2022-09-14 NOTE — Progress Notes (Signed)
Established Patient Office Visit  Subjective   Patient ID: Kirsten Gibson, female    DOB: Jun 11, 1945  Age: 77 y.o. MRN: [id number]  Chief Complaint  Patient presents with   Medical Management of Chronic Issues    Patient is here, concerned about her weight right now. States that she is under some stress right now because they are moving to a new apartment, eventually making a move to a retirement community. States that she is trying to pack up her home and it is very stressful for her. States that she missed taking her BP medications for the past 2 days.   Patient has lost additional weight since her last visit. States that she has been so busy packing that she does not stop to eat anything. Her sister is in the visit and confirms that she has not been eating very much at home.     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) 09/01/2021   HTN (hypertension), benign 09/01/2021   Postmenopausal state 09/01/2021   Prediabetes 09/01/2021   Vascular insufficiency of extremity 05/21/2019   Lung nodule seen on imaging study 05/24/2016   OAB (overactive bladder) 01/21/2016   Vaginal atrophy 01/21/2016   White coat syndrome without hypertension 06/26/2012   Pain of left calf 06/26/2012   TMJ click    S/P chemotherapy, time since 4-12 weeks    Rosacea    History of external beam radiation therapy    Red blood cell antibody positive,  compatible PRBC difficult to obtain 12/29/2011   Neoplastic disease 09/28/2011   Malignant neoplasm of upper-outer quadrant of right breast in female, estrogen receptor positive (HCC) 05/19/2011   Primary cancer of upper outer quadrant of right female breast (HCC) 05/06/2011      Review of Systems  All other systems reviewed and are negative.     Objective:     BP 138/80 (BP Location: Left Arm, Patient Position: Sitting, Cuff Size: Normal)   Pulse 80   Temp 98.4 F (36.9 C) (Oral)   Ht 5' (1.524 m)   Wt 87 lb 11.2 oz (39.8 kg)   LMP 02/09/1996   SpO2 97%   BMI 17.13 kg/m    Physical Exam Vitals reviewed.  Constitutional:      Appearance: Normal appearance. She is underweight.  Eyes:     Conjunctiva/sclera: Conjunctivae normal.  Pulmonary:     Effort: Pulmonary effort is normal.  Musculoskeletal:     Right lower leg: No edema.     Left lower leg: No edema.  Neurological:     Mental Status: She is alert.      No results found for any visits on 09/14/22.    The 10-year ASCVD risk score (Arnett DK, et al., 2019) is: 33.4%    Assessment & Plan:  Weight loss, non-intentional -     Mirtazapine; Take 1 tablet (7.5 mg total) by mouth at bedtime.  Dispense: 30 tablet;  Refill: 2  Patient is down to 87 pounds, she is reports a significant amount of anxiety as well. We discussed the risks/benefits of treatment with mirtazapine and she is willing to try the medication, this will help with not only her weight loss but also her anxiety symptoms. I will see her back in 2 months to weight her again and follow up on her symptoms.    Return in about 2 months (around 11/29/2022) for prediabetes, unintentional.    Karie Georges, MD

## 2022-11-03 ENCOUNTER — Ambulatory Visit (HOSPITAL_COMMUNITY)
Admission: EM | Admit: 2022-11-03 | Discharge: 2022-11-03 | Disposition: A | Discharge status: Home / Self Care | Payer: Medicare Other

## 2022-11-03 ENCOUNTER — Encounter (HOSPITAL_COMMUNITY): Payer: Self-pay | Admitting: *Deleted

## 2022-11-03 ENCOUNTER — Telehealth: Payer: Self-pay | Admitting: Family Medicine

## 2022-11-03 ENCOUNTER — Other Ambulatory Visit: Payer: Self-pay

## 2022-11-03 ENCOUNTER — Encounter (HOSPITAL_COMMUNITY): Payer: Self-pay | Admitting: Emergency Medicine

## 2022-11-03 ENCOUNTER — Emergency Department (HOSPITAL_COMMUNITY)
Admission: EM | Admit: 2022-11-03 | Discharge: 2022-11-03 | Disposition: A | Discharge status: Home / Self Care | Payer: Medicare Other | Attending: Emergency Medicine | Admitting: Emergency Medicine

## 2022-11-03 DIAGNOSIS — R4182 Altered mental status, unspecified: Secondary | ICD-10-CM

## 2022-11-03 DIAGNOSIS — R55 Syncope and collapse: Secondary | ICD-10-CM | POA: Diagnosis not present

## 2022-11-03 DIAGNOSIS — I10 Essential (primary) hypertension: Secondary | ICD-10-CM | POA: Diagnosis not present

## 2022-11-03 DIAGNOSIS — Z79899 Other long term (current) drug therapy: Secondary | ICD-10-CM | POA: Diagnosis not present

## 2022-11-03 DIAGNOSIS — Z91040 Latex allergy status: Secondary | ICD-10-CM | POA: Diagnosis not present

## 2022-11-03 DIAGNOSIS — H539 Unspecified visual disturbance: Secondary | ICD-10-CM | POA: Diagnosis not present

## 2022-11-03 LAB — POCT URINALYSIS DIP (MANUAL ENTRY)
Bilirubin, UA: NEGATIVE
Glucose, UA: NEGATIVE mg/dL
Ketones, POC UA: NEGATIVE mg/dL
Nitrite, UA: NEGATIVE
Protein Ur, POC: NEGATIVE mg/dL
Spec Grav, UA: 1.01 (ref 1.010–1.025)
Urobilinogen, UA: 0.2 E.U./dL
pH, UA: 5.5 (ref 5.0–8.0)

## 2022-11-03 LAB — URINALYSIS, ROUTINE W REFLEX MICROSCOPIC
Bilirubin Urine: NEGATIVE
Glucose, UA: NEGATIVE mg/dL
Hgb urine dipstick: NEGATIVE
Ketones, ur: NEGATIVE mg/dL
Leukocytes,Ua: NEGATIVE
Nitrite: NEGATIVE
Protein, ur: NEGATIVE mg/dL
Specific Gravity, Urine: 1.009 (ref 1.005–1.030)
pH: 7 (ref 5.0–8.0)

## 2022-11-03 LAB — COMPREHENSIVE METABOLIC PANEL
ALT: 54 U/L — ABNORMAL HIGH (ref 0–44)
AST: 38 U/L (ref 15–41)
Albumin: 3.5 g/dL (ref 3.5–5.0)
Alkaline Phosphatase: 76 U/L (ref 38–126)
Anion gap: 10 (ref 5–15)
BUN: 20 mg/dL (ref 8–23)
CO2: 25 mmol/L (ref 22–32)
Calcium: 8.8 mg/dL — ABNORMAL LOW (ref 8.9–10.3)
Chloride: 98 mmol/L (ref 98–111)
Creatinine, Ser: 0.82 mg/dL (ref 0.44–1.00)
GFR, Estimated: >60 mL/min (ref >60)
Glucose, Bld: 88 mg/dL (ref 70–99)
Potassium: 3.6 mmol/L (ref 3.5–5.1)
Sodium: 133 mmol/L — ABNORMAL LOW (ref 135–145)
Total Bilirubin: 0.6 mg/dL (ref 0.3–1.2)
Total Protein: 6.2 g/dL — ABNORMAL LOW (ref 6.5–8.1)

## 2022-11-03 LAB — CBC
HCT: 37.3 % (ref 36.0–46.0)
Hemoglobin: 12.3 g/dL (ref 12.0–15.0)
MCH: 31.1 pg (ref 26.0–34.0)
MCHC: 33 g/dL (ref 30.0–36.0)
MCV: 94.2 fL (ref 80.0–100.0)
Platelets: 228 10*3/uL (ref 150–400)
RBC: 3.96 MIL/uL (ref 3.87–5.11)
RDW: 11.9 % (ref 11.5–15.5)
WBC: 5.2 10*3/uL (ref 4.0–10.5)
nRBC: 0 % (ref 0.0–0.2)

## 2022-11-03 LAB — TROPONIN I (HIGH SENSITIVITY): Troponin I (High Sensitivity): 9 ng/L (ref <18)

## 2022-11-03 LAB — POCT FASTING CBG KUC MANUAL ENTRY: POCT Glucose (KUC): 96 mg/dL (ref 70–99)

## 2022-11-03 NOTE — ED Triage Notes (Signed)
Pt here sent down from UC with c/o near syncopal episode 5 days ago along with what sounds like some visual disturbances

## 2022-11-03 NOTE — ED Triage Notes (Signed)
4-5 days ago started with sudden low abd cramping - when she went to pull down her pants to use the bathroom, she "woke up on the floor" -- states her sister heard the commotion and came to her side. States initially her urine was "very yellow - almost like a sparkling", which has since resolved. Also states she had a visual disturbance the following day "where it was like the toilet water was rising and going to come out" but then her focus returned.  States this morning "it looked like the sheet was moving". Denies any abd issues now. States her "thinking seems to be slightly slower".

## 2022-11-03 NOTE — ED Provider Notes (Signed)
Kirsten Gibson EMERGENCY DEPARTMENT AT [facility] Provider Note   CSN: [id number] Arrival date & time: 11/03/22  1652     History  Chief Complaint  Patient presents with   Near Syncope    Kirsten Gibson is a 77 y.o. female history of hypertension, previous C. difficile here presenting with multiple complaints.  She states that about 2 weeks ago, she had an episode of lower abdominal pain and was on the toilet and may have passed out.  She states that she woke up on the floor and was looking at the ceiling.  She states that her family was there and got her up.  She states that since then she has been having some sparkling urine that is also more yellow than usual.  Patient denies any frequency or dysuria.  Patient went to urgent care and had urinalysis that showed 1+ leuk esterases.  Patient is sent here for syncope workup.  Per the husband who was at the bedside, patient has intermittent confusion but not worse than usual.  Patient is not on blood thinners.  The history is provided by the patient.       Home Medications Prior to Admission medications   Medication Sig Start Date End Date Taking? Authorizing Provider  Ascorbic Acid (VITAMIN C PO) Take by mouth.    [provider]  calcium carbonate (OS-CAL) 600 MG TABS Take 750 mg by mouth daily.     [provider]  cholecalciferol (VITAMIN D3) 25 MCG (1000 UNIT) tablet Take 1,000 Units by mouth daily.    [provider]  Glucose Blood (BLOOD GLUCOSE TEST STRIPS 333) STRP 1 strip by In Vitro route daily. 01/11/22   Karie Georges, MD  lisinopril (ZESTRIL) 5 MG tablet Take 1 tablet (5 mg total) by mouth daily. 07/29/22   Karie Georges, MD  mirtazapine (REMERON) 7.5 MG tablet Take 1 tablet (7.5 mg total) by mouth at bedtime. 09/14/22   Karie Georges, MD  Multiple Vitamins-Minerals (MULTIVITAMIN PO) Take 1 capsule by mouth daily.    [provider]      Allergies    Latex, Adhesive  [tape], Trazodone and nefazodone, Zofran [ondansetron hcl], Diclofenac, and Naproxen    Review of Systems   Review of Systems  Neurological:  Positive for syncope.  Psychiatric/Behavioral:  Positive for confusion.   All other systems reviewed and are negative.   Physical Exam Updated Vital Signs BP (!) 142/94 (BP Location: Left Arm)   Pulse 79   Temp 98.2 F (36.8 C) (Oral)   Resp 15   LMP 02/09/1996   SpO2 100%  Physical Exam Vitals and nursing note reviewed.  Constitutional:      Appearance: Normal appearance.  HENT:     Head: Normocephalic.     Comments: No obvious scalp hematoma    Nose: Nose normal.     Mouth/Throat:     Mouth: Mucous membranes are moist.  Eyes:     Extraocular Movements: Extraocular movements intact.     Pupils: Pupils are equal, round, and reactive to light.  Cardiovascular:     Rate and Rhythm: Normal rate and regular rhythm.     Pulses: Normal pulses.     Heart sounds: Normal heart sounds.  Pulmonary:     Effort: Pulmonary effort is normal.     Breath sounds: Normal breath sounds.  Abdominal:     General: Abdomen is flat.     Palpations: Abdomen is soft.  Musculoskeletal:  General: Normal range of motion.     Cervical back: Normal range of motion and neck supple.  Skin:    General: Skin is warm.     Capillary Refill: Capillary refill takes less than 2 seconds.  Neurological:     General: No focal deficit present.     Mental Status: She is alert and oriented to person, place, and time.     Comments: Cranial nerves II to XII intact.  Patient has normal strength and sensation bilateral arms and legs  Psychiatric:        Mood and Affect: Mood normal.        Behavior: Behavior normal.     ED Results / Procedures / Treatments   Labs (all labs ordered are listed, but only abnormal results are displayed) Labs Reviewed  COMPREHENSIVE METABOLIC PANEL - Abnormal; Notable for the following components:      Result Value   Sodium 133  (*)    Calcium 8.8 (*)    Total Protein 6.2 (*)    ALT 54 (*)    All other components within normal limits  URINE CULTURE  CBC  URINALYSIS, ROUTINE W REFLEX MICROSCOPIC  TROPONIN I (HIGH SENSITIVITY)  TROPONIN I (HIGH SENSITIVITY)    EKG EKG Interpretation Date/Time:  Wednesday November 03 2022 17:18:17 EDT Ventricular Rate:  74 PR Interval:  160 QRS Duration:  74 QT Interval:  390 QTC Calculation: 432 R Axis:   -79  Text Interpretation: Normal sinus rhythm Left anterior fascicular block Septal infarct , age undetermined Abnormal ECG When compared with ECG of 03-Apr-2000 08:53, PREVIOUS ECG IS PRESENT Confirmed by Richardean Canal [phone]) on 11/03/2022 7:02:12 PM  Radiology No results found.  Procedures Procedures    Medications Ordered in ED Medications - No data to display  ED Course/ Medical Decision Making/ A&P                                 Medical Decision Making Kirsten Gibson is a 77 y.o. female here presenting with near syncopal episode.  This happened about 2 weeks ago.  Patient has no signs of scalp hematoma.  Patient is well-appearing.  I reviewed urgent care notes.  Will get basic blood work and 1 set of troponin.  Patient has no chest pain or shortness of breath.  Given that she had head injury about 2 weeks ago, I discussed with her about CT head to rule out intracranial bleeding.  She states that she has no headache and she has no signs of head injury so wants to hold off on that.  7:34 PM I reviewed patient's labs and they were unremarkable.  She has 1+ leuk esterase in her urinalysis at urgent care.  She states that she wants to hold off on antibiotics since she has a history of C. difficile.  I have sent off a urine culture and will hold antibiotics for now.  Her troponin is negative and chemistry is unremarkable.  At this point she is stable for discharge and she can follow-up with PCP outpatient   Problems Addressed: Syncope, unspecified syncope type:  acute illness or injury  Amount and/or Complexity of Data Reviewed Labs: ordered. Decision-making details documented in ED Course.    Final Clinical Impression(s) / ED Diagnoses Final diagnoses:  None    Rx / DC Orders ED Discharge Orders     None  Charlynne Pander, MD 11/03/22 Barry Brunner

## 2022-11-03 NOTE — Telephone Encounter (Signed)
If she is having altered mental status she needs to be seen urgently-- please direct her to the urgent care or ER for evaluation

## 2022-11-03 NOTE — Telephone Encounter (Signed)
Spoke with the patient and informed her of the message below.  Patient stated she lives near [facility] and was given the address of the [facility] for the [facility] and [place] locations.

## 2022-11-03 NOTE — Discharge Instructions (Signed)
As we discussed, your lab work were unremarkable.  We have sent off urine culture and you will be called if you have any bacteria in your urine.  We decided to hold off on antibiotics since you may precipitate your C. difficile  Please stay hydrated and follow-up with your doctor   Return to ER if you have another episode of passing out, trouble urinating, severe pain

## 2022-11-03 NOTE — Discharge Instructions (Signed)
Please go directly to the emergency room for further evaluation and management of your symptoms

## 2022-11-03 NOTE — Telephone Encounter (Signed)
[place] with triage nurse called and stated she spoke to pt. Pt told her she was given some medicine, but she didn't know what it was. Nurse states pt was describing strange things, pt states she felt the water in the toilet was rising and the sheets on her bed looked like waves. Triage nurse recommended for pt to call 911, pt refusing. I let her know msg would be sent back to care team.

## 2022-11-03 NOTE — ED Notes (Signed)
Patient is being discharged from the Urgent Care and sent to the Emergency Department via private vehicle with spouse . Per Phil Dopp, NP, patient is in need of higher level of care due to new onset visual disturbances, syncopal episode 4-5 days ago. Patient is aware and verbalizes understanding of plan of care.  Vitals:   11/03/22 1605  BP: 135/79  Pulse: 70  Resp: 18  Temp: 98.1 F (36.7 C)  SpO2: 96%
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
## 2022-11-03 NOTE — ED Notes (Signed)
Tech attempted to get vitals and EKG on pt. Pt. Stated that she "would like a woman to do everything".

## 2022-11-05 DIAGNOSIS — H53143 Visual discomfort, bilateral: Secondary | ICD-10-CM | POA: Diagnosis not present

## 2022-11-05 DIAGNOSIS — H04123 Dry eye syndrome of bilateral lacrimal glands: Secondary | ICD-10-CM | POA: Diagnosis not present

## 2022-11-05 DIAGNOSIS — H25813 Combined forms of age-related cataract, bilateral: Secondary | ICD-10-CM | POA: Diagnosis not present

## 2022-11-05 DIAGNOSIS — H11123 Conjunctival concretions, bilateral: Secondary | ICD-10-CM | POA: Diagnosis not present

## 2022-11-05 DIAGNOSIS — D3132 Benign neoplasm of left choroid: Secondary | ICD-10-CM | POA: Diagnosis not present

## 2022-11-05 LAB — URINE CULTURE: Culture: NO GROWTH

## 2022-11-29 ENCOUNTER — Encounter: Payer: Self-pay | Admitting: Family Medicine

## 2022-11-29 ENCOUNTER — Ambulatory Visit (INDEPENDENT_AMBULATORY_CARE_PROVIDER_SITE_OTHER): Payer: Medicare Other | Admitting: Family Medicine

## 2022-11-29 VITALS — BP 100/58 | HR 70 | Temp 98.3°F | Ht 60.0 in | Wt 86.3 lb

## 2022-11-29 DIAGNOSIS — R410 Disorientation, unspecified: Secondary | ICD-10-CM | POA: Insufficient documentation

## 2022-11-29 DIAGNOSIS — R7303 Prediabetes: Secondary | ICD-10-CM | POA: Diagnosis not present

## 2022-11-29 DIAGNOSIS — R634 Abnormal weight loss: Secondary | ICD-10-CM | POA: Diagnosis not present

## 2022-11-29 LAB — HEMOGLOBIN A1C: Hgb A1c MFr Bld: 5.9 % (ref 4.6–6.5)

## 2022-11-29 MED ORDER — MIRTAZAPINE 7.5 MG PO TABS
7.5000 mg | ORAL_TABLET | Freq: Every day | ORAL | 2 refills | Status: DC
Start: 2022-11-29 — End: 2023-02-24

## 2022-11-29 NOTE — Progress Notes (Signed)
Established Patient Office Visit  Subjective   Patient ID: Kirsten Gibson, female    DOB: Mar 03, 1945  Age: 77 y.o. MRN: [id number]  Chief Complaint  Patient presents with   Medical Management of Chronic Issues    Pt is here for follow up on her weight and her blood pressure. She reports that she is only taking 1/2 tablet of the mirtazapine instead of the whole tablet. She thinks that they might be helping with her hunger, states that she is moving and she hasn't really stopped to eat much. The patient seems confused today about some of the history, at first she states that she stopped the mirtazapine but then stated that she is taking 1/2 tablet.   Patient had an episode when she went to ER for a syncopal episode. She was confused at the time, had fallen and hit her head. Her work up was unremarkable, urine culture was negative. EKG showed normal sinus rhythm. No further passing out episodes. Patient did not remember that blood was drawn in the ER, but I reviewed this with her. States that the day she had the episode she had gone to the bathroom to urinate when she had the episode. States that she did not seek medical attention until 4-5 days later, states she was very sleepy that night as well.     Current Outpatient Medications  Medication Instructions   Ascorbic Acid (VITAMIN C PO) Oral   calcium carbonate (OS-CAL) 750 mg, Oral, Daily   cholecalciferol (VITAMIN D3) 1,000 Units, Oral, Daily   Glucose Blood (BLOOD GLUCOSE TEST STRIPS 333) STRP 1 strip, In Vitro, Daily   lisinopril (ZESTRIL) 5 mg, Oral, Daily   mirtazapine (REMERON) 7.5 mg, Oral, Daily at bedtime   Multiple Vitamins-Minerals (MULTIVITAMIN PO) 1 capsule, Daily    Patient Active Problem List   Diagnosis Date Noted   Weight loss, non-intentional 11/29/2022   Intermittent confusion 11/29/2022   Personal history of malignant neoplasm of breast 09/07/2021   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) 09/01/2021   HTN (hypertension), benign 09/01/2021   Postmenopausal state 09/01/2021   Prediabetes 09/01/2021   Vascular insufficiency of extremity 05/21/2019   Lung nodule seen on imaging study 05/24/2016   OAB (overactive bladder) 01/21/2016   Vaginal atrophy 01/21/2016   White coat syndrome without hypertension 06/26/2012   Pain of left calf 06/26/2012   TMJ click    S/P chemotherapy, time since 4-12 weeks    Rosacea    History of external beam radiation therapy    Red blood cell antibody positive, compatible PRBC difficult to obtain 12/29/2011   Neoplastic disease 09/28/2011   Malignant neoplasm of upper-outer quadrant of right breast in female, estrogen receptor positive (HCC) 05/19/2011   Primary cancer of upper outer quadrant of right female breast (HCC) 05/06/2011      Review of Systems  All other systems reviewed and are negative.     Objective:     BP (!) 100/58 (BP Location: Left Arm, Patient Position: Sitting, Cuff Size: Normal)   Pulse 70   Temp 98.3 F (36.8 C) (Oral)   Ht 5' (1.524 m)   Wt 86 lb 4.8 oz (39.1 kg)   LMP 02/09/1996   SpO2 98%   BMI 16.85 kg/m    Physical Exam Vitals reviewed.  Constitutional:      Appearance: Normal appearance. She is well-groomed and underweight.  Cardiovascular:     Rate and Rhythm: Normal  rate and regular rhythm.     Pulses: Normal pulses.     Heart sounds: S1 normal and S2 normal.  Pulmonary:     Effort: Pulmonary effort is normal.     Breath sounds: Normal breath sounds and air entry.  Musculoskeletal:     Right lower leg: No edema.     Left lower leg: No edema.  Neurological:     Mental Status: She is alert and oriented to person, place, and time. Mental status is at baseline. She is confused.     Cranial Nerves: No cranial nerve deficit.     Sensory: No sensory deficit.     Motor: No weakness.     Gait: Gait is intact.  Psychiatric:        Mood and Affect: Mood and affect normal.         Speech: Speech normal.        Behavior: Behavior normal.        Thought Content: Thought content is paranoid.        Cognition and Memory: Memory is impaired.        Judgment: Judgment normal.      Results for orders placed or performed in visit on 11/29/22  Hemoglobin A1c  Result Value Ref Range   Hgb A1c MFr Bld 5.9 4.6 - 6.5 %      The 10-year ASCVD risk score (Arnett DK, et al., 2019) is: 19.1%    Assessment & Plan:  Prediabetes Assessment & Plan: Diet controlled, checking new A1C today for follow up  Orders: -     Hemoglobin A1c  Weight loss, non-intentional Assessment & Plan: Weight is essentially stable from previous, the patient seems confused as to wether she is taking the mirtazapine or if she stopped this medication (she told me both things in the visit). I encouraged her to try increasing the mirtazapine to a whole tablet at bedtime. RTC 6 months  Orders: -     Mirtazapine; Take 1 tablet (7.5 mg total) by mouth at bedtime.  Dispense: 30 tablet; Refill: 2  Intermittent confusion Assessment & Plan: New symptom, patient is also exhibiting paranoia today as she thinks that her sister  "poisoned" her because her sister was feeding her things and "acting suspicious", the husband was in the visit today but he could not really corroborate the story she reported to me about this. Pt could not remember if they drew blood work in the ER and she seemed confused about wether or not she stopped taking the mirtazapine so I am not clear if she is still taking this medication or not. I advised that she needs a CT of her head due to the confusion and the fact that when she fell she did hit her head on the ground. She is now willing to get the CT scan.  Orders: -     CT HEAD WO CONTRAST ( ); Future     Return in about 6 months (around 05/30/2023) for follow up weight check and A1C.    Karie Georges, MD

## 2022-11-29 NOTE — Assessment & Plan Note (Signed)
Weight is essentially stable from previous, the patient seems confused as to wether she is taking the mirtazapine or if she stopped this medication (she told me both things in the visit). I encouraged her to try increasing the mirtazapine to a whole tablet at bedtime. RTC 6 months

## 2022-11-29 NOTE — Assessment & Plan Note (Signed)
New symptom, patient is also exhibiting paranoia today as she thinks that her sister  "poisoned" her because her sister was feeding her things and "acting suspicious", the husband was in the visit today but he could not really corroborate the story she reported to me about this. Pt could not remember if they drew blood work in the ER and she seemed confused about wether or not she stopped taking the mirtazapine so I am not clear if she is still taking this medication or not. I advised that she needs a CT of her head due to the confusion and the fact that when she fell she did hit her head on the ground. She is now willing to get the CT scan.

## 2022-11-29 NOTE — Assessment & Plan Note (Signed)
Diet controlled, checking new A1C today for follow up

## 2022-12-07 ENCOUNTER — Telehealth: Payer: Self-pay | Admitting: *Deleted

## 2022-12-07 NOTE — Telephone Encounter (Signed)
Transition Care Management Unsuccessful Follow-up Telephone Call  Date of discharge and from where:  [facility]  11/03/2022  Attempts:  1st Attempt  Reason for unsuccessful TCM follow-up call:  No answer/busy

## 2022-12-08 ENCOUNTER — Telehealth: Payer: Self-pay | Admitting: *Deleted

## 2022-12-08 NOTE — Telephone Encounter (Signed)
Transition Care Management Unsuccessful Follow-up Telephone Call  Date of discharge and from where:  [facility] [place]  11/03/2022  Attempts:  2nd Attempt  Reason for unsuccessful TCM follow-up call:  Left voice message

## 2022-12-17 ENCOUNTER — Ambulatory Visit
Admission: RE | Admit: 2022-12-17 | Discharge: 2022-12-17 | Disposition: A | Discharge status: Home / Self Care | Payer: Medicare Other | Source: Ambulatory Visit | Attending: Family Medicine

## 2022-12-17 DIAGNOSIS — R519 Headache, unspecified: Secondary | ICD-10-CM | POA: Diagnosis not present

## 2022-12-17 DIAGNOSIS — R4182 Altered mental status, unspecified: Secondary | ICD-10-CM | POA: Diagnosis not present

## 2022-12-17 DIAGNOSIS — R410 Disorientation, unspecified: Secondary | ICD-10-CM

## 2022-12-20 ENCOUNTER — Encounter: Payer: Self-pay | Admitting: Internal Medicine

## 2023-01-03 ENCOUNTER — Telehealth: Payer: Self-pay | Admitting: Family Medicine

## 2023-01-03 NOTE — Progress Notes (Signed)
CT of head is normal, no explanation of her symptoms, if she continues to have episodes of syncope then I would refer her to either neurology or cardiology for evaluation. Please call pt with results

## 2023-01-03 NOTE — Telephone Encounter (Signed)
Pt is calling back and does not want this phone number in system [phone] please  call pt back on that number for ct head w/o contrast result

## 2023-01-04 NOTE — Telephone Encounter (Signed)
Patient informed of the results

## 2023-01-04 NOTE — Telephone Encounter (Signed)
Pt called, returning CMA's call. CMA was with a patient. Pt asked that CMA call back at her earliest convenience. *Please call her at [phone]*

## 2023-02-24 ENCOUNTER — Other Ambulatory Visit: Payer: Self-pay | Admitting: Family Medicine

## 2023-02-24 DIAGNOSIS — I10 Essential (primary) hypertension: Secondary | ICD-10-CM

## 2023-02-24 DIAGNOSIS — R634 Abnormal weight loss: Secondary | ICD-10-CM

## 2023-02-24 MED ORDER — MIRTAZAPINE 7.5 MG PO TABS
7.5000 mg | ORAL_TABLET | Freq: Every day | ORAL | 2 refills | Status: DC
Start: 2023-02-24 — End: 2023-05-30

## 2023-02-24 NOTE — Telephone Encounter (Signed)
Copied from CRM [id number]. Topic: Clinical - Medication Question >> Feb 24, 2023  2:31 PM Kirsten Gibson wrote: Reason for CRM: Pt would like to request a 90 day supply of mirtazapine (REMERON) 7.5 MG tablet

## 2023-05-30 ENCOUNTER — Ambulatory Visit: Payer: Medicare Other | Admitting: Family Medicine

## 2023-05-30 ENCOUNTER — Encounter: Payer: Self-pay | Admitting: Family Medicine

## 2023-05-30 VITALS — BP 122/82 | HR 80 | Temp 97.8°F | Ht 64.0 in | Wt 90.3 lb

## 2023-05-30 DIAGNOSIS — I10 Essential (primary) hypertension: Secondary | ICD-10-CM

## 2023-05-30 DIAGNOSIS — R634 Abnormal weight loss: Secondary | ICD-10-CM

## 2023-05-30 DIAGNOSIS — H903 Sensorineural hearing loss, bilateral: Secondary | ICD-10-CM

## 2023-05-30 DIAGNOSIS — R7303 Prediabetes: Secondary | ICD-10-CM

## 2023-05-30 LAB — HEMOGLOBIN A1C: Hgb A1c MFr Bld: 5.8 % (ref 4.6–6.5)

## 2023-05-30 MED ORDER — MIRTAZAPINE 7.5 MG PO TABS
7.5000 mg | ORAL_TABLET | Freq: Every day | ORAL | 1 refills | Status: DC
Start: 2023-05-30 — End: 2023-11-21

## 2023-05-30 NOTE — Assessment & Plan Note (Signed)
 Chronic, stable, diet controlled. Will check new A1C today.

## 2023-05-30 NOTE — Assessment & Plan Note (Signed)
 Current hypertension medications:       Sig   lisinopril  (ZESTRIL ) 5 MG tablet (Taking) TAKE 1 TABLET(5 MG) BY MOUTH DAILY       Chronic, stable, BP is well controlled. Will continue the above medication as prescribed.

## 2023-05-30 NOTE — Patient Instructions (Signed)
 Selenium sulfide shampoo to h

## 2023-05-30 NOTE — Assessment & Plan Note (Signed)
 Weight is stable from previous. Continue mirtazepine 7.5 mg once daily.

## 2023-05-30 NOTE — Progress Notes (Signed)
 Established Patient Office Visit  Subjective   Patient ID: Kirsten Gibson, female    DOB: 11-Jun-1945  Age: 78 y.o. MRN: [id number]  Chief Complaint  Patient presents with   Medical Management of Chronic Issues    Pt is here today for follow up on prediabetes and HTN.  Pt reports she thinks her apartment has bed bugs and is having a difficult time getting her landlords to treat for them. States that there was company that came out and looked but she was told they did not find any. States that she found 3 bug bites on her leg that happened last night.  States they are not itching.  Pt is reporting hair loss that has continued despite her weight staying stable, states she does have some flaking but no itching of her scalp. She reports that she is "eating things she shouldn't" and is a little worried about her A1C.   HTN -- BP in office performed and is well controlled. She  reports no side effects to the medications, no chest pain, SOB, dizziness or headaches. She has a BP cuff at home and is checking BP regularly, reports they are in the normal range.        Current Outpatient Medications  Medication Instructions   Ascorbic Acid (VITAMIN C PO) Take by mouth.   calcium carbonate (OS-CAL) 750 mg, Daily   cholecalciferol (VITAMIN D3) 1,000 Units, Daily   Glucose Blood (BLOOD GLUCOSE TEST STRIPS 333) STRP 1 strip, In Vitro, Daily   lisinopril  (ZESTRIL ) 5 MG tablet TAKE 1 TABLET(5 MG) BY MOUTH DAILY   mirtazapine  (REMERON ) 7.5 mg, Oral, Daily at bedtime   Multiple Vitamins-Minerals (MULTIVITAMIN PO) 1 capsule, Daily    Patient Active Problem List   Diagnosis Date Noted   Weight loss, non-intentional 11/29/2022   Intermittent confusion 11/29/2022   Personal history of malignant neoplasm of breast 09/07/2021   Secondary and unspecified malignant neoplasm of axilla and upper limb lymph nodes (HCC) 09/01/2021   HTN (hypertension), benign 09/01/2021   Postmenopausal state 09/01/2021    Prediabetes 09/01/2021   Vascular insufficiency of extremity 05/21/2019   Lung nodule seen on imaging study 05/24/2016   OAB (overactive bladder) 01/21/2016   Vaginal atrophy 01/21/2016   White coat syndrome without hypertension 06/26/2012   Pain of left calf 06/26/2012   TMJ click    S/P chemotherapy, time since 4-12 weeks    Rosacea    History of external beam radiation therapy    Red blood cell antibody positive, compatible PRBC difficult to obtain 12/29/2011   Neoplastic disease 09/28/2011   Malignant neoplasm of upper-outer quadrant of right breast in female, estrogen receptor positive (HCC) 05/19/2011   Primary cancer of upper outer quadrant of right female breast (HCC) 05/06/2011      Review of Systems  All other systems reviewed and are negative.     Objective:     BP 122/82   Pulse 80   Temp 97.8 F (36.6 C) (Oral)   Ht 5\' 4"  (1.626 m)   Wt 90 lb 4.8 oz (41 kg)   LMP 02/09/1996   SpO2 98%   BMI 15.50 kg/m    Physical Exam Vitals reviewed.  Constitutional:      Appearance: Normal appearance. She is well-groomed and underweight.  Cardiovascular:     Rate and Rhythm: Normal rate and regular rhythm.     Heart sounds: S1 normal and S2 normal.  Pulmonary:     Effort: Pulmonary effort  is normal.     Breath sounds: Normal breath sounds and air entry.  Musculoskeletal:     Right lower leg: No edema.     Left lower leg: No edema.  Skin:    Comments: Skin shows 3 small bumps on the right anterior thigh, they do not appear to be insect bites.   Neurological:     Mental Status: She is alert and oriented to person, place, and time. Mental status is at baseline.     Gait: Gait is intact.  Psychiatric:        Mood and Affect: Mood and affect normal.        Speech: Speech normal.        Behavior: Behavior normal.      Results for orders placed or performed in visit on 05/30/23  Hemoglobin A1c  Result Value Ref Range   Hgb A1c MFr Bld 5.8 4.6 - 6.5 %       The 10-year ASCVD risk score (Arnett DK, et al., 2019) is: 29.7%    Assessment & Plan:  Prediabetes Assessment & Plan: Chronic, stable, diet controlled. Will check new A1C today.   Orders: -     Hemoglobin A1c; Future  Weight loss, non-intentional Assessment & Plan: Weight is stable from previous. Continue mirtazepine 7.5 mg once daily.   Orders: -     Mirtazapine ; Take 1 tablet (7.5 mg total) by mouth at bedtime.  Dispense: 90 tablet; Refill: 1  HTN (hypertension), benign Assessment & Plan: Current hypertension medications:       Sig   lisinopril  (ZESTRIL ) 5 MG tablet (Taking) TAKE 1 TABLET(5 MG) BY MOUTH DAILY       Chronic, stable, BP is well controlled. Will continue the above medication as prescribed.    Sensorineural hearing loss (SNHL) of both ears -     Ambulatory referral to Audiology    Pt thinks that her apartment has bed bugs? However there is no evidence of significant bites on her and the company did not find any evidence in her apartment. I reassured the patient and advised her to use her phone to take photos if she sees any unusual bugs.   Return in about 6 months (around 11/29/2023) for HTN, prediabetes.Aida House, MD

## 2023-05-31 ENCOUNTER — Encounter: Payer: Self-pay | Admitting: *Deleted

## 2023-09-22 ENCOUNTER — Ambulatory Visit (INDEPENDENT_AMBULATORY_CARE_PROVIDER_SITE_OTHER): Admitting: Adult Health

## 2023-09-22 VITALS — BP 120/60 | HR 104 | Temp 98.1°F | Ht 64.0 in | Wt 104.0 lb

## 2023-09-22 DIAGNOSIS — M79602 Pain in left arm: Secondary | ICD-10-CM | POA: Diagnosis not present

## 2023-09-22 MED ORDER — METHYLPREDNISOLONE 4 MG PO TBPK
ORAL_TABLET | ORAL | 0 refills | Status: DC
Start: 2023-09-22 — End: 2023-11-29

## 2023-09-22 MED ORDER — NAPROXEN 500 MG PO TABS: 500.0000 mg | ORAL_TABLET | Freq: Two times a day (BID) | ORAL | 0 refills | Status: DC

## 2023-09-22 NOTE — Progress Notes (Signed)
 Subjective:    Patient ID: Kirsten Gibson, female    DOB: July 10, 1945, 78 y.o.   MRN: [id number]  Arm Pain     78 year old female who  has a past medical history of Anemia due to chemotherapy (08/23/2011), Anxiety, Breast cancer (HCC), radiation therapy (01/11/12- 02/28/12), Mycosis fungoides, Rosacea, S/P chemotherapy, time since 4-12 weeks, TMJ click, and Urinary frequency.  She presents to the office today for an acute issue of left arm pain.  Pain radiates from her left hand up past her elbow. She reports that her pain started August 10th after she was carrying heavy items. She then did home exercises and thinks she caused more pain.   She has been taking Advil every 4 hours for 3 days with some improvement.    Review of Systems See HPI   Past Medical History:  Diagnosis Date   Anemia due to chemotherapy 08/23/2011   Anxiety    Breast cancer (HCC)    bilateral infiltrating ductal carcinoma   Hx of radiation therapy 01/11/12- 02/28/12   right chest wall/supraclav fossa/drain site/scar boost   Mycosis fungoides    follows up with [facility] once to twice per year    Rosacea    S/P chemotherapy, time since 4-12 weeks    TMJ click    improved after changing the way dental exams were done   Urinary frequency     Social History   Socioeconomic History   Marital status: Married    Spouse name: Not on file   Number of children: Not on file   Years of education: Not on file   Highest education level: Not on file  Occupational History   Not on file  Tobacco Use   Smoking status: Former    Current packs/day: 0.00    Average packs/day: 1 pack/day for 20.0 years (20.0 ttl pk-yrs)    Types: Cigarettes    Start date: 02/08/1966    Quit date: 02/08/1986    Years since quitting: 37.6   Smokeless tobacco: Never  Vaping Use   Vaping status: Never Used  Substance and Sexual Activity   Alcohol use: No   Drug use: No   Sexual activity: Not on file  Other Topics Concern   Not on file   Social History Narrative   Not on file   Social Drivers of Health   Financial Resource Strain: Low Risk  (06/16/2022)   Overall Financial Resource Strain (CARDIA)    Difficulty of Paying Living Expenses: Not hard at all  Food Insecurity: No Food Insecurity (06/16/2022)   Hunger Vital Sign    Worried About Running Out of Food in the Last Year: Never true    Ran Out of Food in the Last Year: Never true  Transportation Needs: No Transportation Needs (06/16/2022)   PRAPARE - Administrator, Civil Service (Medical): No    Lack of Transportation (Non-Medical): No  Physical Activity: Inactive (06/16/2022)   Exercise Vital Sign    Days of Exercise per Week: 0 days    Minutes of Exercise per Session: 0 min  Stress: No Stress Concern Present (06/16/2022)   [employer] of Occupational Health - Occupational Stress Questionnaire    Feeling of Stress : Not at all  Social Connections: Moderately Isolated (06/16/2022)   Social Connection and Isolation Panel    Frequency of Communication with Friends and Family: More than three times a week    Frequency of Social Gatherings with Friends and  Family: More than three times a week    Attends Religious Services: Never    Active Member of Clubs or Organizations: No    Attends Banker Meetings: Never    Marital Status: Married  Catering manager Violence: Not At Risk (06/16/2022)   Humiliation, Afraid, Rape, and Kick questionnaire    Fear of Current or Ex-Partner: No    Emotionally Abused: No    Physically Abused: No    Sexually Abused: No    Past Surgical History:  Procedure Laterality Date   ABDOMINAL HYSTERECTOMY  1988   partial with on ovary remaining.   benign tumor removed from neck  1974   BREAST BIOPSY  05/19/11   left  and right breast   BREAST EXCISIONAL BIOPSY  1991   benign   CHOLECYSTECTOMY     MASTECTOMY  11/2011   bilat, [facility]   PORT-A-CATH REMOVAL Left 04/06/2012   Procedure: MINOR REMOVAL  PORT-A-CATH;  Surgeon: Sherlean JINNY Laughter, MD;  Location: [facility];  Service: General;  Laterality: Left;   PORTACATH PLACEMENT  05/21/2011   Procedure: INSERTION PORT-A-CATH;  Surgeon: Sherlean JINNY Laughter, MD;  Location: [facility];  Service: General;  Laterality: Left;    Family History  Problem Relation Age of Onset   Cancer Maternal Aunt        breast to brain   Cancer Paternal Grandmother        breast and/or ovarian///unknown   Stomach cancer Paternal Grandfather        stomach   Colon cancer Neg Hx     Allergies  Allergen Reactions   Latex Dermatitis and Rash   Adhesive [Tape]     ALLERGIC TO OP-SITE......USE OP-SITE FLEXIGRID INSTEAD !!!   Trazodone And Nefazodone Nausea Only   Zofran [Ondansetron Hcl] Swelling    Swelling&redness to face!!   Diclofenac Other (See Comments)    Fever, chills   Naproxen Other (See Comments)    Fever, chills    Current Outpatient Medications on File Prior to Visit  Medication Sig Dispense Refill   Ascorbic Acid (VITAMIN C PO) Take by mouth.     calcium carbonate (OS-CAL) 600 MG TABS Take 750 mg by mouth daily.      cholecalciferol (VITAMIN D3) 25 MCG (1000 UNIT) tablet Take 1,000 Units by mouth daily.     Glucose Blood (BLOOD GLUCOSE TEST STRIPS 333) STRP 1 strip by In Vitro route daily. 100 strip 3   lisinopril (ZESTRIL) 5 MG tablet TAKE 1 TABLET(5 MG) BY MOUTH DAILY 90 tablet 1   mirtazapine (REMERON) 7.5 MG tablet Take 1 tablet (7.5 mg total) by mouth at bedtime. 90 tablet 1   Multiple Vitamins-Minerals (MULTIVITAMIN PO) Take 1 capsule by mouth daily.     No current facility-administered medications on file prior to visit.    BP 120/60   Pulse (!) 104   Temp 98.1 F (36.7 C) (Oral)   Ht 5' 4 (1.626 m)   Wt 104 lb (47.2 kg)   LMP 02/09/1996   SpO2 98%   BMI 17.85 kg/m       Objective:   Physical Exam Vitals and nursing note reviewed.  Constitutional:      Appearance: Normal appearance.  Musculoskeletal:         General: Normal range of motion.     Left shoulder: No swelling or bony tenderness. Normal range of motion. Normal strength.     Left elbow: Normal range of motion.  Tenderness present.     Left forearm: Tenderness present. No swelling, edema or bony tenderness.     Right hand: Normal strength. Normal sensation. There is no disruption of two-point discrimination.     Left hand: Normal strength. Normal sensation. There is no disruption of two-point discrimination.     Comments: She has tenderness throughout her left forearm up to her Biceps and Triceps brachii. No muscle rupture. No decreased grip strength   Skin:    General: Skin is warm and dry.  Neurological:     General: No focal deficit present.     Mental Status: She is alert and oriented to person, place, and time.  Psychiatric:        Mood and Affect: Mood normal.        Behavior: Behavior normal.        Thought Content: Thought content normal.        Judgment: Judgment normal.        Assessment & Plan:  1. Left arm pain (Primary) - I think she has a soft tissue strain. Will send in naprosyn , she was advised not to take any additional NSAIDS with this medication as well as a medrol  dose pack  - She can use heat  - Follow up if not improving in the next 4-6 days  - naproxen  (NAPROSYN ) 500 MG tablet; Take 1 tablet (500 mg total) by mouth 2 (two) times daily with a meal.  Dispense: 30 tablet; Refill: 0 - methylPREDNISolone  (MEDROL  DOSEPAK) 4 MG TBPK tablet; Take as directed  Dispense: 21 tablet; Refill: 0  Darleene Shape, NP

## 2023-09-22 NOTE — Patient Instructions (Addendum)
 I think you strained the muscles in your arm  You an use heat for 20 minutes on and 20-40 minutes off.   I will send in Naprosyn  for you to take, do not take any additional Advil, Aleve , Motrin, or Ibuprofen with this.   Follow up as needed

## 2023-09-23 ENCOUNTER — Ambulatory Visit: Payer: Self-pay

## 2023-09-23 NOTE — Telephone Encounter (Signed)
 FYI Only or Action Required?: FYI only for provider.  Patient was last seen in primary care on 09/22/2023 by Merna Huxley, NP.  Called Nurse Triage reporting Arm Pain.  Symptoms began several days ago.  Interventions attempted: OTC medications: Ibuprofen.  Symptoms are: unchanged.  Triage Disposition: Home Care  Patient/caregiver understands and will follow disposition?: Yes Fyi- patient expressed her concern with her visit with [facility] yesterday. Pt says that she ws prescribed naprosyn  and says she is allergic to that. Per chart review, patient has low severity reaction and it was prescribed to reduce her ibuprofen intake and that the benefits outweigh the risk.  RN also advised patient that Medrol  Dosepak was ordered and provided patient education on that. Pt states that she now feels at ease about taking the medications prescribed. No further concerns at this time.   Copied from CRM [id number]. Topic: Clinical - Red Word Triage >> Sep 23, 2023  8:36 AM Berneda FALCON wrote: Red Word that prompted transfer to Nurse Triage: Patient states she is have excruciating pain located in her left forearm and radiated into elbow the back of the arm and into her back. She believes she may have fractured her arm somehow.  Pt states she has osteoporosis and the medication that Cory Nafziger prescribed for her, she is allergic to so she did not get filled. States she cannot keep taking the ibuprofen due to her osteoporosis.  This has been ongoing for 4 days now. Reason for Disposition  [1] Recent medical visit within 24 hours AND [2] symptoms unchanged (staying SAME; not worse) AND [3] caller has additional questions  Answer Assessment - Initial Assessment Questions 1. MAIN CONCERN OR SYMPTOM:  What is your main concern right now? What question do you have? What's the main symptom you're worried about? (e.g., fever, pain, redness, swelling)     Patient mainly concerned about possible allergic reaction  to naprosyn . Also expresses concern that she may have a small fracture  2. ONSET: When did the  pain  start?     4 days ago  3. BETTER-SAME-WORSE: Are you getting better, staying the same, or getting worse compared to how you felt at your last visit to the doctor (most recent medical visit)?     Same  4. VISIT DATE: When were you seen? (e.g., date)     8/14  5. VISIT DOCTOR: What is the name of the doctor taking care of you now?     Huxley, [place]  6. VISIT DIAGNOSIS:  What was the main symptom or problem that you were seen for? Were you given a diagnosis?      Left arm strain  7. VISIT TREATMENT: What treatment did you receive? (e.g., staples, sutures, splint, cast)     No  8. VISIT MEDICINES: Did the doctor order any new medicines for you to use? If Yes, ask: Have you filled the prescription and started taking the medicine?      Methylprednisone and naproxen   9. NEXT APPOINTMENT: Have you scheduled a follow-up appointment with your doctor?     No f/u scheduled  10. PAIN: Is there any pain? If Yes, ask: How bad is it?  (Scale 0-10; or none, mild, moderate, severe)       Yes, moderate  11. FEVER: Do you have a fever? If Yes, ask: What is it, how was it measured  and when did it start?       No fever  12. OTHER SYMPTOMS: Do you have  any other symptoms?       No  13. PREGNANCY: Is there any chance you are pregnant? When was your last menstrual period?       NO  Protocols used: Recent Medical Visit for Injury Follow-up Call-A-[facility]

## 2023-10-11 DIAGNOSIS — J3489 Other specified disorders of nose and nasal sinuses: Secondary | ICD-10-CM | POA: Diagnosis not present

## 2023-10-25 ENCOUNTER — Other Ambulatory Visit: Payer: Self-pay | Admitting: Family Medicine

## 2023-10-25 DIAGNOSIS — I10 Essential (primary) hypertension: Secondary | ICD-10-CM

## 2023-11-08 ENCOUNTER — Ambulatory Visit (INDEPENDENT_AMBULATORY_CARE_PROVIDER_SITE_OTHER)

## 2023-11-08 VITALS — Ht 64.0 in | Wt 104.0 lb

## 2023-11-08 DIAGNOSIS — Z0000 Encounter for general adult medical examination without abnormal findings: Secondary | ICD-10-CM | POA: Diagnosis not present

## 2023-11-08 NOTE — Patient Instructions (Addendum)
 Kirsten Gibson,  Thank you for taking the time for your Medicare Wellness Visit. I appreciate your continued commitment to your health goals. Please review the care plan we discussed, and feel free to reach out if I can assist you further.  Medicare recommends these wellness visits once per year to help you and your care team stay ahead of potential health issues. These visits are designed to focus on prevention, allowing your provider to concentrate on managing your acute and chronic conditions during your regular appointments.  Please note that Annual Wellness Visits do not include a physical exam. Some assessments may be limited, especially if the visit was conducted virtually. If needed, we may recommend a separate in-person follow-up with your provider.  Ongoing Care Seeing your primary care provider every 3 to 6 months helps us  monitor your health and provide consistent, personalized care. Last office visit on  09/22/2023.  Next office visit on 11/29/2023.  You are due for a Flu vaccine, a pneumonia vaccine, a tetanus vaccine and a Shingles vaccine.  Remember to discuss a colonoscopy with Dr. Ozell and also the new pneumonia vaccine, Prevnar 20.  Referrals If a referral was made during today's visit and you haven't received any updates within two weeks, please contact the referred provider directly to check on the status.  Recommended Screenings:  Health Maintenance  Topic Date Due   Zoster (Shingles) Vaccine (1 of 2) Never done   DTaP/Tdap/Td vaccine (1 - Tdap) 08/01/2013   Pneumococcal Vaccine for age over 35 (2 of 2 - PCV) 03/05/2016   COVID-19 Vaccine (2 - Moderna risk series) 01/04/2020   Flu Shot  09/09/2023   Hepatitis C Screening  05/29/2024*   Medicare Annual Wellness Visit  11/07/2024   DEXA scan (bone density measurement)  Completed   HPV Vaccine  Aged Out   Meningitis B Vaccine  Aged Out   Breast Cancer Screening  Discontinued   Colon Cancer Screening  Discontinued   *Topic was postponed. The date shown is not the original due date.       11/08/2023   10:28 AM  Advanced Directives  Does Patient Have a Medical Advance Directive? Yes  Type of Estate agent of [place];Living will  Copy of Healthcare Power of Attorney in Chart? No - copy requested  Would patient like information on creating a medical advance directive? No - Patient declined   Advance Care Planning is important because it: Ensures you receive medical care that aligns with your values, goals, and preferences. Provides guidance to your family and loved ones, reducing the emotional burden of decision-making during critical moments.  Vision: Annual vision screenings are recommended for early detection of glaucoma, cataracts, and diabetic retinopathy. These exams can also reveal signs of chronic conditions such as diabetes and high blood pressure.  Dental: Annual dental screenings help detect early signs of oral cancer, gum disease, and other conditions linked to overall health, including heart disease and diabetes.  Please see the attached documents for additional preventive care recommendations.

## 2023-11-08 NOTE — Progress Notes (Signed)
 Subjective:   Kirsten Gibson is a 78 y.o. who presents for a Medicare Wellness preventive visit.  As a reminder, Annual Wellness Visits don't include a physical exam, and some assessments may be limited, especially if this visit is performed virtually. We may recommend an in-person follow-up visit with your provider if needed.  Visit Complete: Virtual I connected with  [name] on 11/08/23 by a audio enabled telemedicine application and verified that I am speaking with the correct person using two identifiers.  Patient Location: Home  Provider Location: Home Office  I discussed the limitations of evaluation and management by telemedicine. The patient expressed understanding and agreed to proceed.  Vital Signs: Because this visit was a virtual/telehealth visit, some criteria may be missing or patient reported. Any vitals not documented were not able to be obtained and vitals that have been documented are patient reported.  VideoDeclined- This patient declined Librarian, academic. Therefore the visit was completed with audio only.  Persons Participating in Visit: Patient.  AWV Questionnaire: No: Patient Medicare AWV questionnaire was not completed prior to this visit.  Cardiac Risk Factors include: advanced age (>29men, >89 women);hypertension     Objective:    Today's Vitals   11/08/23 1014  Weight: 104 lb (47.2 kg)  Height: 5' 4 (1.626 m)   Body mass index is 17.85 kg/m.     11/08/2023   10:28 AM 06/16/2022    3:19 PM 07/01/2021    8:06 AM 06/24/2015   11:21 AM 06/03/2015    6:40 PM 05/21/2014   11:40 AM 11/12/2013    9:19 AM  Advanced Directives  Does Patient Have a Medical Advance Directive? Yes Yes Yes Yes  Yes  No;Yes  Yes   Type of Estate agent of Kirsten Gibson;Living will Healthcare Power of [name];Living will Healthcare Power of [name];Living will Healthcare Power of [name];Living will  Healthcare Power of [name] Power of [name] Power of Attorney   Does patient want to make changes to medical advance directive?   No - Patient declined No - Patient declined      Copy of Healthcare Power of Attorney in Chart? No - copy requested No - copy requested No - copy requested      Would patient like information on creating a medical advance directive? No - Patient declined           Data saved with a previous flowsheet row definition    Current Medications (verified) Outpatient Encounter Medications as of 11/08/2023  Medication Sig   Ascorbic Acid (VITAMIN C PO) Take by mouth.   calcium carbonate (OS-CAL) 600 MG TABS Take 750 mg by mouth daily.    cholecalciferol (VITAMIN D3) 25 MCG (1000 UNIT) tablet Take 1,000 Units by mouth daily.   Glucose Blood (BLOOD GLUCOSE TEST STRIPS 333) STRP 1 strip by In Vitro route daily.   lisinopril  (ZESTRIL ) 5 MG tablet TAKE 1 TABLET(5 MG) BY MOUTH DAILY   methylPREDNISolone  (MEDROL  DOSEPAK) 4 MG TBPK tablet Take as directed   mirtazapine  (REMERON ) 7.5 MG tablet Take 1 tablet (7.5 mg total) by mouth at bedtime.   Multiple Vitamins-Minerals (MULTIVITAMIN PO) Take 1 capsule by mouth daily.   naproxen  (NAPROSYN ) 500 MG tablet Take 1 tablet (500 mg total) by mouth 2 (two) times daily with a meal.   No facility-administered encounter medications on file as of 11/08/2023.    Allergies (verified) Latex, Adhesive [tape], Trazodone  and nefazodone, Zofran  [ondansetron   hcl], Diclofenac, and Naproxen    History: Past Medical History:  Diagnosis Date   Anemia due to chemotherapy 08/23/2011   Anxiety    Breast cancer (HCC)    bilateral infiltrating ductal carcinoma   Hx of radiation therapy 01/11/12- 02/28/12   right chest wall/supraclav fossa/drain site/scar boost   Mycosis fungoides    follows up with [facility] once to twice per year    Rosacea    S/P chemotherapy, time since 4-12 weeks    TMJ click    improved after changing the way dental exams were done    Urinary frequency    Past Surgical History:  Procedure Laterality Date   ABDOMINAL HYSTERECTOMY  1988   partial with on ovary remaining.   benign tumor removed from neck  1974   BREAST BIOPSY  05/19/11   left  and right breast   BREAST EXCISIONAL BIOPSY  1991   benign   CHOLECYSTECTOMY     MASTECTOMY  11/2011   bilat, [facility]   PORT-A-CATH REMOVAL Left 04/06/2012   Procedure: MINOR REMOVAL PORT-A-CATH;  Surgeon: Sherlean JINNY Laughter, MD;  Location: [facility];  Service: General;  Laterality: Left;   PORTACATH PLACEMENT  05/21/2011   Procedure: INSERTION PORT-A-CATH;  Surgeon: Sherlean JINNY Laughter, MD;  Location: [facility];  Service: General;  Laterality: Left;   Family History  Problem Relation Age of Onset   Cancer Maternal Aunt        breast to brain   Cancer Paternal Grandmother        breast and/or ovarian///unknown   Stomach cancer Paternal Grandfather        stomach   Colon cancer Neg Hx    Social History   Socioeconomic History   Marital status: Married    Spouse name: Charles   Number of children: Not on file   Years of education: Not on file   Highest education level: Not on file  Occupational History   Occupation: RETIRED  Tobacco Use   Smoking status: Former    Current packs/day: 0.00    Average packs/day: 1 pack/day for 20.0 years (20.0 ttl pk-yrs)    Types: Cigarettes    Start date: 02/08/1966    Quit date: 02/08/1986    Years since quitting: 37.7   Smokeless tobacco: Never  Vaping Use   Vaping status: Never Used  Substance and Sexual Activity   Alcohol use: No   Drug use: No   Sexual activity: Not on file  Other Topics Concern   Not on file  Social History Narrative   Lives with husband/2025   Social Drivers of Health   Financial Resource Strain: Low Risk  (11/08/2023)   Overall Financial Resource Strain (CARDIA)    Difficulty of Paying Living Expenses: Not very hard  Food Insecurity: No Food Insecurity (11/08/2023)   Hunger  Vital Sign    Worried About Running Out of Food in the Last Year: Never true    Ran Out of Food in the Last Year: Never true  Transportation Needs: No Transportation Needs (11/08/2023)   PRAPARE - Administrator, Civil Service (Medical): No    Lack of Transportation (Non-Medical): No  Physical Activity: Insufficiently Active (11/08/2023)   Exercise Vital Sign    Days of Exercise per Week: 7 days    Minutes of Exercise per Session: 10 min  Stress: No Stress Concern Present (11/08/2023)   [employer] of Occupational Health - Occupational Stress Questionnaire  Feeling of Stress: Not at all  Social Connections: Unknown (11/08/2023)   Social Connection and Isolation Panel    Frequency of Communication with Friends and Family: Once a week    Frequency of Social Gatherings with Friends and Family: Not on file    Attends Religious Services: More than 4 times per year    Active Member of [employer] or Organizations: Yes    Attends Engineer, structural: More than 4 times per year    Marital Status: Married    Tobacco Counseling Counseling given: Not Answered    Clinical Intake:  Pre-visit preparation completed: Yes  Pain : No/denies pain     BMI - recorded: 17.85 Nutritional Status: BMI <19  Underweight Nutritional Risks: None Diabetes: No  Lab Results  Component Value Date   HGBA1C 5.8 05/30/2023   HGBA1C 5.9 11/29/2022   HGBA1C 6.0 06/16/2022     How often do you need to have someone help you when you read instructions, pamphlets, or other written materials from your doctor or pharmacy?: 1 - Never  Interpreter Needed?: No  Information entered by :: Ferguson Gertner, RMA   Activities of Daily Living     11/08/2023   10:18 AM  In your present state of health, do you have any difficulty performing the following activities:  Hearing? 0  Vision? 0  Difficulty concentrating or making decisions? 0  Walking or climbing stairs? 0  Dressing or bathing? 0   Doing errands, shopping? 0  Preparing Food and eating ? N  Using the Toilet? N  In the past six months, have you accidently leaked urine? N  Do you have problems with loss of bowel control? N  Managing your Medications? N  Managing your Finances? N  Housekeeping or managing your Housekeeping? N    Patient Care Team: Ozell Heron HERO, MD as PCP - General (Family Medicine) Keenan Hastings, MD (Radiation Oncology) Claryce Dallas LABOR, MD as Surgeon (Surgical Oncology) Flynn Kelly Heck, MD as Referring Physician (Dermatology) Loretha Ash, MD as Consulting Physician (Hematology and Oncology)  I have updated your Care Teams any recent Medical Services you may have received from other providers in the past year.     Assessment:   This is a routine wellness examination for Fatim.  Hearing/Vision screen Hearing Screening - Comments:: Has some difficulties with hearing-per pt Vision Screening - Comments:: Denies vision issues.    Goals Addressed               This Visit's Progress     Stay healthy (pt-stated)   On track     I do not want to be pre-diabetic!       Depression Screen     11/08/2023   10:34 AM 11/29/2022    1:19 PM 09/14/2022    9:57 AM 06/16/2022    3:16 PM 04/01/2022    9:53 AM  PHQ 2/9 Scores  PHQ - 2 Score 0 0 1 0 0  PHQ- 9 Score 0 4       Fall Risk     11/08/2023   10:29 AM 09/14/2022    9:57 AM 06/16/2022    3:18 PM  Fall Risk   Falls in the past year? 0 0 0  Number falls in past yr: 0 0 0  Injury with Fall? 0 0 0  Risk for fall due to :  No Fall Risks No Fall Risks  Follow up Falls evaluation completed;Falls prevention discussed Falls evaluation completed Falls  prevention discussed    MEDICARE RISK AT HOME:  Medicare Risk at Home Any stairs in or around the home?: No If so, are there any without handrails?: No Home free of loose throw rugs in walkways, pet beds, electrical cords, etc?: Yes Adequate lighting in your home to reduce risk of  falls?: Yes Life alert?: No Use of a cane, walker or w/c?: No Grab bars in the bathroom?: Yes Shower chair or bench in shower?: Yes Elevated toilet seat or a handicapped toilet?: Yes  TIMED UP AND GO:  Was the test performed?  No  Cognitive Function: 6CIT completed        11/08/2023   10:30 AM 06/16/2022    3:19 PM  6CIT Screen  What Year? 0 points 0 points  What month? 0 points 0 points  What time? 0 points 0 points  Count back from 20 0 points 0 points  Months in reverse 0 points 0 points  Repeat phrase 0 points 0 points  Total Score 0 points 0 points    Immunizations Immunization History  Administered Date(s) Administered   Fluad Quad(high Dose 65+) 11/12/2021   Influenza-Unspecified 11/04/2015   MODERNA COVID-19 SARS-COV-2 PEDS BIVALENT BOOSTER 10yr-42yr 12/07/2019   Pneumococcal Polysaccharide-23 04/11/2012   Pneumococcal-Unspecified 03/06/2015   Tetanus 07/31/2013    Screening Tests Health Maintenance  Topic Date Due   Zoster Vaccines- Shingrix (1 of 2) Never done   DTaP/Tdap/Td (1 - Tdap) 08/01/2013   Pneumococcal Vaccine: 50+ Years (2 of 2 - PCV) 03/05/2016   COVID-19 Vaccine (2 - Moderna risk series) 01/04/2020   Influenza Vaccine  09/09/2023   Hepatitis C Screening  05/29/2024 (Originally 03/05/1963)   Medicare Annual Wellness (AWV)  11/07/2024   DEXA SCAN  Completed   HPV VACCINES  Aged Out   Meningococcal B Vaccine  Aged Out   Mammogram  Discontinued   Colonoscopy  Discontinued    Health Maintenance Items Addressed: See Nurse Notes at the end of this note  Additional Screening:  Vision Screening: Recommended annual ophthalmology exams for early detection of glaucoma and other disorders of the eye. Is the patient up to date with their annual eye exam?  No  Who is the provider or what is the name of the office in which the patient attends annual eye exams? Patient can not remember name of provider.  Dental Screening: Recommended annual dental  exams for proper oral hygiene  Community Resource Referral / Chronic Care Management: CRR required this visit?  No   CCM required this visit?  No   Plan:    I have personally reviewed and noted the following in the patient's chart:   Medical and social history Use of alcohol, tobacco or illicit drugs  Current medications and supplements including opioid prescriptions. Patient is not currently taking opioid prescriptions. Functional ability and status Nutritional status Physical activity Advanced directives List of other physicians Hospitalizations, surgeries, and ER visits in previous 12 months Vitals Screenings to include cognitive, depression, and falls Referrals and appointments  In addition, I have reviewed and discussed with patient certain preventive protocols, quality metrics, and best practice recommendations. A written personalized care plan for preventive services as well as general preventive health recommendations were provided to patient.   Ricardo Schubach L Ashlen Kiger, CMA   11/08/2023   After Visit Summary: (MyChart) Due to this being a telephonic visit, the after visit summary with patients personalized plan was offered to patient via MyChart   Notes: Patient is due for a  colonoscopy and would like to discuss during next office visit.  She is due for a Flu vaccine and a pneumonia vaccine, which she would like to get during her office visit.  Patient is also due for a Tdap and Shingrix vaccines.

## 2023-11-20 ENCOUNTER — Other Ambulatory Visit: Payer: Self-pay | Admitting: Family Medicine

## 2023-11-20 DIAGNOSIS — R634 Abnormal weight loss: Secondary | ICD-10-CM

## 2023-11-29 ENCOUNTER — Ambulatory Visit: Admitting: Family Medicine

## 2023-11-29 ENCOUNTER — Encounter: Payer: Self-pay | Admitting: Family Medicine

## 2023-11-29 VITALS — BP 110/72 | HR 70 | Temp 98.0°F | Ht 64.0 in | Wt 104.0 lb

## 2023-11-29 DIAGNOSIS — Z13220 Encounter for screening for lipoid disorders: Secondary | ICD-10-CM | POA: Diagnosis not present

## 2023-11-29 DIAGNOSIS — L659 Nonscarring hair loss, unspecified: Secondary | ICD-10-CM | POA: Diagnosis not present

## 2023-11-29 DIAGNOSIS — J3489 Other specified disorders of nose and nasal sinuses: Secondary | ICD-10-CM

## 2023-11-29 DIAGNOSIS — R7303 Prediabetes: Secondary | ICD-10-CM | POA: Diagnosis not present

## 2023-11-29 DIAGNOSIS — I10 Essential (primary) hypertension: Secondary | ICD-10-CM | POA: Diagnosis not present

## 2023-11-29 NOTE — Progress Notes (Signed)
 Established Patient Office Visit  Subjective   Patient ID: Kirsten Gibson, female    DOB: Nov 26, 1945  Age: 78 y.o. MRN: [id number]  Chief Complaint  Patient presents with   Medical Management of Chronic Issues    HPI  Discussed the use of AI scribe software for clinical note transcription with the patient, who gave verbal consent to proceed.  History of Present Illness   Kirsten Gibson is a 78 year old female who presents with nasal discharge concerns. She is accompanied by Kirsten Gibson, who also experiences similar symptoms.  She experiences nasal discharge with a stringy appearance and occasional blood. She is concerned about the possibility of larvae. She has not provided a sample for examination. States that she is producing more of a whitish drainage, she went to see her ENT Dr. Carlie and he did not order any specific testing. Pt is requesting a test of her sputum to look for larvae I explained to the patient that finidng larvae would be highly unlikely.  She and Kirsten Gibson are both experiencing hair loss, which she finds concerning due to its simultaneous occurrence.  She takes lisinopril  5 mg and mirtazapine  at night. She reports she is sleeping relatively well, BP is controlled today, she denies any dizziness, no chest pain or headaches. Is checking her BP at home regularly.       Current Outpatient Medications  Medication Instructions   Ascorbic Acid (VITAMIN C PO) Take by mouth.   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) 09/01/2021   HTN (hypertension), benign 09/01/2021   Postmenopausal state 09/01/2021   Prediabetes 09/01/2021   Vascular insufficiency of extremity 05/21/2019   Lung nodule seen on imaging study 05/24/2016   OAB (overactive bladder) 01/21/2016   Vaginal atrophy 01/21/2016   White coat syndrome without hypertension 06/26/2012   Pain of left calf 06/26/2012   TMJ click    S/P chemotherapy, time since 4-12 weeks    Rosacea    History of external beam radiation therapy    Red blood cell antibody positive, compatible PRBC difficult to obtain 12/29/2011   Neoplastic disease 09/28/2011   Malignant neoplasm of upper-outer quadrant of right breast in female, estrogen receptor positive (HCC) 05/19/2011   Primary cancer of upper outer quadrant of right female breast (HCC) 05/06/2011     Review of Systems  All other systems reviewed and are negative.     Objective:     BP 110/72   Pulse 70   Temp 98 F (36.7 C) (Oral)   Ht 5' 4 (1.626 m)   Wt 104 lb (47.2 kg)   LMP 02/09/1996   SpO2 98%   BMI 17.85 kg/m    Physical Exam Vitals reviewed.  Constitutional:      Appearance: Normal appearance. She is normal weight.  Neck:     Thyroid : No thyromegaly.  Cardiovascular:     Rate and Rhythm: Normal rate and regular rhythm.     Heart sounds: Normal heart sounds. No murmur heard. Pulmonary:     Effort: Pulmonary effort is normal.     Breath sounds: Normal breath sounds. No wheezing.  Musculoskeletal:     Cervical back: Neck supple.  Lymphadenopathy:     Cervical: No cervical adenopathy.  Neurological:     Mental Status: She is alert and oriented to person, place, and time. Mental status is at baseline.  Psychiatric:        Mood and Affect: Mood normal.        Behavior: Behavior normal.      No results  found for any visits on 11/29/23.    The 10-year ASCVD risk score (Arnett DK, et al., 2019) is: 18.1%    Assessment & Plan:  Prediabetes -     Hemoglobin A1c; Future  HTN (hypertension), benign -     Comprehensive metabolic panel with GFR; Future  Lipid screening -     Lipid panel; Future  Nasal discharge -     Ova and parasite examination; Future  Hair loss -     TSH; Future   Assessment and Plan    Prediabetes - Ordered annual blood work including cholesterol, liver, and kidney function tests - Ordered A1c test - Advised to schedule lab appointment for blood work - Advised to get tetanus shot at pharmacy - Discussed shingles vaccine option  Hypertension Well-controlled with current medication regimen. Blood pressure reading today is 110/72 mmHg. - Continue lisinopril  5 mg daily  Depression Managed with mirtazapine  taken at night. - Continue mirtazapine  at night  nasal parasitic infestation -- not likely Reports of suspected nasal parasitic infestation with larvae. ENT consultation was not helpful. Examination of nasal discharge appears to be regular mucus with possible blood due to irritation. Unlikely to be parasitic infestation as larvae do not typically reside in the nose. Discussed the possibility of bringing in a sample for further examination. Explained that the sample does not need to be refrigerated or in saline as it is not a culture test. - Ordered ova and parasite exam with nasal discharge sample - Provided sterile specimen cup for sample collection - Instructed to take sample to [facility] for analysis  Hair loss Reports of hair loss potentially related to stress, nutrition, hormonal changes, or aging. Discussed common causes including stress, nutrition, hormones, and aging. Recommended increasing protein intake and considering hair, skin, and nail supplements. Thyroid  function to be checked as it can also contribute to hair loss. - Ordered thyroid   function test - Recommended increasing protein intake - Suggested hair, skin, and nail supplements        Return in about 6 months (around 05/29/2024) for HTN.    Heron CHRISTELLA Sharper, MD

## 2023-11-29 NOTE — Patient Instructions (Signed)
 Tetanus vaccine is due

## 2023-11-30 LAB — HEMOGLOBIN A1C: Hgb A1c MFr Bld: 5.9 % (ref 4.6–6.5)

## 2023-12-01 ENCOUNTER — Ambulatory Visit: Payer: Self-pay | Admitting: Family Medicine

## 2023-12-07 ENCOUNTER — Other Ambulatory Visit

## 2023-12-14 ENCOUNTER — Other Ambulatory Visit (INDEPENDENT_AMBULATORY_CARE_PROVIDER_SITE_OTHER)

## 2023-12-14 DIAGNOSIS — Z13220 Encounter for screening for lipoid disorders: Secondary | ICD-10-CM | POA: Diagnosis not present

## 2023-12-14 DIAGNOSIS — I10 Essential (primary) hypertension: Secondary | ICD-10-CM | POA: Diagnosis not present

## 2023-12-14 DIAGNOSIS — L659 Nonscarring hair loss, unspecified: Secondary | ICD-10-CM | POA: Diagnosis not present

## 2023-12-14 LAB — COMPREHENSIVE METABOLIC PANEL WITH GFR
ALT: 22 U/L (ref 0–35)
AST: 28 U/L (ref 0–37)
Albumin: 4.3 g/dL (ref 3.5–5.2)
Alkaline Phosphatase: 64 U/L (ref 39–117)
BUN: 36 mg/dL — ABNORMAL HIGH (ref 6–23)
CO2: 30 meq/L (ref 19–32)
Calcium: 9.5 mg/dL (ref 8.4–10.5)
Chloride: 97 meq/L (ref 96–112)
Creatinine, Ser: 0.8 mg/dL (ref 0.40–1.20)
GFR: 71.39 mL/min (ref >60.00)
Glucose, Bld: 80 mg/dL (ref 70–99)
Potassium: 3.8 meq/L (ref 3.5–5.1)
Sodium: 135 meq/L (ref 135–145)
Total Bilirubin: 0.5 mg/dL (ref 0.2–1.2)
Total Protein: 7 g/dL (ref 6.0–8.3)

## 2023-12-14 LAB — LIPID PANEL
Cholesterol: 192 mg/dL (ref 0–200)
HDL: 80.1 mg/dL (ref >39.00)
LDL Cholesterol: 102 mg/dL — ABNORMAL HIGH (ref 0–99)
NonHDL: 111.94
Total CHOL/HDL Ratio: 2
Triglycerides: 48 mg/dL (ref 0.0–149.0)
VLDL: 9.6 mg/dL (ref 0.0–40.0)

## 2023-12-15 LAB — TSH: TSH: 2.19 u[IU]/mL (ref 0.35–5.50)

## 2024-02-23 ENCOUNTER — Telehealth: Payer: Self-pay | Admitting: *Deleted

## 2024-02-23 ENCOUNTER — Other Ambulatory Visit: Payer: Self-pay | Admitting: Family Medicine

## 2024-02-23 DIAGNOSIS — I10 Essential (primary) hypertension: Secondary | ICD-10-CM

## 2024-02-23 NOTE — Telephone Encounter (Signed)
Rx previously sent by PCP. °

## 2024-02-23 NOTE — Telephone Encounter (Signed)
 Copied from CRM #[id number]. Topic: Clinical - Medication Question >> Feb 23, 2024  9:50 AM Revonda D wrote: Reason for CRM: Pt stated that the pharmacy sent over a refill request for the lisinopril  (ZESTRIL ) 5 MG tablet and wants to know if this can be approved today. Pt would like a callback with an update.

## 2024-06-04 ENCOUNTER — Ambulatory Visit: Admitting: Family Medicine
